# Patient Record
Sex: Female | Born: 1940 | Race: Black or African American | Hispanic: No | Marital: Single | Smoking: Former smoker
Health system: Southern US, Community
[De-identification: ages and names within clinical notes are randomized; demographics above are authoritative.]

## PROBLEM LIST (undated history)

## (undated) DIAGNOSIS — N183 Chronic kidney disease, stage 3 unspecified: Secondary | ICD-10-CM

## (undated) DIAGNOSIS — R0600 Dyspnea, unspecified: Secondary | ICD-10-CM

## (undated) DIAGNOSIS — M1990 Unspecified osteoarthritis, unspecified site: Secondary | ICD-10-CM

## (undated) DIAGNOSIS — E7800 Pure hypercholesterolemia, unspecified: Secondary | ICD-10-CM

## (undated) DIAGNOSIS — E11319 Type 2 diabetes mellitus with unspecified diabetic retinopathy without macular edema: Secondary | ICD-10-CM

## (undated) DIAGNOSIS — E119 Type 2 diabetes mellitus without complications: Secondary | ICD-10-CM

## (undated) DIAGNOSIS — M109 Gout, unspecified: Secondary | ICD-10-CM

## (undated) DIAGNOSIS — I10 Essential (primary) hypertension: Secondary | ICD-10-CM

## (undated) HISTORY — PX: COLONOSCOPY: SHX174

## (undated) HISTORY — DX: Gout, unspecified: M10.9

## (undated) HISTORY — DX: Dyspnea, unspecified: R06.00

## (undated) HISTORY — DX: Type 2 diabetes mellitus with unspecified diabetic retinopathy without macular edema: E11.319

## (undated) HISTORY — DX: Chronic kidney disease, stage 3 (moderate): N18.3

## (undated) HISTORY — DX: Essential (primary) hypertension: I10

## (undated) HISTORY — DX: Chronic kidney disease, stage 3 unspecified: N18.30

## (undated) HISTORY — DX: Type 2 diabetes mellitus without complications: E11.9

## (undated) HISTORY — PX: TONSILLECTOMY: SHX5217

## (undated) HISTORY — DX: Pure hypercholesterolemia, unspecified: E78.00

---

## 1967-06-15 HISTORY — PX: BRAIN SURGERY: SHX531

## 2001-11-19 ENCOUNTER — Inpatient Hospital Stay (HOSPITAL_COMMUNITY): Admission: EM | Admit: 2001-11-19 | Discharge: 2001-11-21 | Payer: Self-pay | Admitting: Emergency Medicine

## 2002-08-24 ENCOUNTER — Encounter: Payer: Self-pay | Admitting: Family Medicine

## 2002-08-24 ENCOUNTER — Encounter: Admission: RE | Admit: 2002-08-24 | Discharge: 2002-08-24 | Payer: Self-pay | Admitting: Family Medicine

## 2002-08-29 ENCOUNTER — Encounter: Admission: RE | Admit: 2002-08-29 | Discharge: 2002-11-27 | Payer: Self-pay | Admitting: Family Medicine

## 2003-06-05 ENCOUNTER — Encounter: Admission: RE | Admit: 2003-06-05 | Discharge: 2003-06-05 | Payer: Self-pay | Admitting: Family Medicine

## 2003-09-02 ENCOUNTER — Ambulatory Visit (HOSPITAL_COMMUNITY): Admission: RE | Admit: 2003-09-02 | Discharge: 2003-09-02 | Payer: Self-pay | Admitting: Gastroenterology

## 2010-02-09 ENCOUNTER — Encounter: Admission: RE | Admit: 2010-02-09 | Discharge: 2010-02-09 | Payer: Self-pay | Admitting: Family Medicine

## 2010-02-12 LAB — HM MAMMOGRAPHY: HM Mammogram: NORMAL

## 2010-06-14 HISTORY — PX: CATARACT EXTRACTION, BILATERAL: SHX1313

## 2010-10-05 ENCOUNTER — Institutional Professional Consult (permissible substitution) (INDEPENDENT_AMBULATORY_CARE_PROVIDER_SITE_OTHER): Payer: PRIVATE HEALTH INSURANCE | Admitting: Family Medicine

## 2010-10-05 DIAGNOSIS — I10 Essential (primary) hypertension: Secondary | ICD-10-CM

## 2010-10-05 DIAGNOSIS — E7800 Pure hypercholesterolemia, unspecified: Secondary | ICD-10-CM

## 2010-10-30 NOTE — H&P (Signed)
[facility]  Patient:    Kerri, Richardson Visit Number: [id number] MRN: [id number]          Service Type: MED Location: [phone] Attending Physician:  Talmadge Coventry Dictated by:   Heather Roberts, M.D. Admit Date:  11/19/2001 Discharge Date: 11/21/2001   CC:         Talmadge Coventry, M.D.   History and Physical  CHIEF COMPLAINT:  Nausea, vomiting, and cough.  HISTORY OF PRESENT ILLNESS:  Kerri Richardson is a 70 year old, single, black female who was exposed to pneumonia about one week prior to admission.  About five days prior to admission, she began to complain of nausea and anorexia and was vomiting on the the day of admission.  She had a nonproductive cough throughout this time.  A friend checked her sugar on the evening of admission and it was elevated, so she was brought to the emergency room where she was found to have a temperature of 103 degrees.  She felt markedly improved after IV fluids, Tylenol, and Phenergan.  Pulse oximetry on room air 94%.  SOCIAL HISTORY:  A 70 year old, black, single female who works at [employer] orders.  She does not drink alcohol or smoke cigarettes.  She lives alone.  Family in town.  CURRENT MEDICATIONS: 1. Glucophage 500 mg four q.d. 2. Avandia 8 mg. 3. Glucotrol 10 mg q.d. 4. Altace 5 mg.  PAST MEDICAL HISTORY:  In 1968, some type of decompression surgery for headache.  FAMILY HISTORY:  Both parents with hypertension.  Mother deceased of ovarian cancer.  A sister has hypertension.  Her dad has diabetes.  REVIEW OF SYSTEMS:  HEENT:  No current allergies.  Cardiac:  No chest pain. Mild shortness of breath.  Pulmonary:  Nonproductive cough this week.  GI: Negative.  LABORATORY DATA:  The chest x-ray showed a left lower lobe density consistent with pneumonia.  White count 10.6, hemoglobin 11.4.  Sodium 131, K 3.6, glucose 362.  CK 129, MB negative, troponin negative.   Urine with bacteria.  IMPRESSION:  Diabetes with consolidated pneumonia.  PLAN:  Admission for antibiotics, IV fluids, and insulin.  The patient is hungrier now. Dictated by:   Heather Roberts, M.D. Attending Physician:  Talmadge Coventry DD:  11/19/01 TD:  11/21/01 Job: [id number]

## 2010-10-30 NOTE — Op Note (Signed)
NAMEPIEPER, KASIK                    ACCOUNT NO.:  [id number]   MEDICAL RECORD NO.:  [id number]                   PATIENT TYPE:  AMB   LOCATION:  ENDO                                 FACILITY:  [facility]   PHYSICIAN:  Anselmo Rod, M.D.               DATE OF BIRTH:  [birth year]   DATE OF PROCEDURE:  09/02/2003  DATE OF DISCHARGE:                                 OPERATIVE REPORT   PROCEDURE PERFORMED:  Screening colonoscopy.   ENDOSCOPIST:  Anselmo Rod, M.D.   INSTRUMENT USED:  Olympus videocolonoscope.   INDICATION FOR THE PROCEDURE:  A 70 year old African-American female  undergoing screening colonoscopy to rule out colonic polyps, masses, etc.   PREPROCEDURE PREPARATION:  Informed consent was procured from the patient.  The patient was fasted for eight hours prior to the procedure and prepped  with a bottle of magnesium citrate and a gallon of GoLYTELY the night prior  to the procedure.   PREPROCEDURE PHYSICAL:  VITAL SIGNS:  Stable.  NECK:  Supple.  CHEST:  Clear to auscultation.  S1 and S2 regular.  ABDOMEN:  Soft with normal bowel sounds.   DESCRIPTION OF THE PROCEDURE:  The patient was placed in the left lateral  decubitus position and sedated with 50 mg of Demerol and 5 mg of Versed  intravenously.  Once the patient was adequately sedated and maintained on  low-flow oxygen and continuous cardiac monitoring, the Olympus  videocolonoscope was advanced from the rectum to the cecum.  The  appendicular orifice and ileocecal valve were clearly visualized and  photographed.  No masses, polyps, erosions, ulcerations, or diverticula were  seen.   IMPRESSION:  Normal colonoscopy to the cecum.   RECOMMENDATIONS:  1. Continue a high fiber diet with liberal fluid intake.  2. Repeat colorectal cancer screening in the next 20 years unless the     patient develops any abnormal symptoms in the interim.  3. Outpatient followup on a p.r.n. basis.                            Anselmo Rod, M.D.    JNM/MEDQ  D:  09/02/2003  T:  09/03/2003  Job:  [id number]   cc:   Talmadge Coventry, M.D.  [address]  [place]  [place] [address]  Fax: [phone]

## 2011-01-05 LAB — HM DIABETES EYE EXAM

## 2011-01-11 ENCOUNTER — Encounter: Payer: Self-pay | Admitting: *Deleted

## 2011-01-11 ENCOUNTER — Ambulatory Visit: Payer: PRIVATE HEALTH INSURANCE | Admitting: Family Medicine

## 2011-02-01 ENCOUNTER — Ambulatory Visit (INDEPENDENT_AMBULATORY_CARE_PROVIDER_SITE_OTHER): Payer: PRIVATE HEALTH INSURANCE | Admitting: Family Medicine

## 2011-02-01 ENCOUNTER — Encounter: Payer: Self-pay | Admitting: Family Medicine

## 2011-02-01 VITALS — BP 134/64 | HR 72 | Ht 62.0 in | Wt 254.0 lb

## 2011-02-01 DIAGNOSIS — E7800 Pure hypercholesterolemia, unspecified: Secondary | ICD-10-CM

## 2011-02-01 DIAGNOSIS — N183 Chronic kidney disease, stage 3 unspecified: Secondary | ICD-10-CM

## 2011-02-01 DIAGNOSIS — E119 Type 2 diabetes mellitus without complications: Secondary | ICD-10-CM

## 2011-02-01 DIAGNOSIS — I10 Essential (primary) hypertension: Secondary | ICD-10-CM

## 2011-02-01 LAB — POCT GLYCOSYLATED HEMOGLOBIN (HGB A1C): Hemoglobin A1C: 6.6

## 2011-02-01 NOTE — Patient Instructions (Addendum)
will need lipids and c-met before next visit in 3 months (written on prescription for you)  Gas pains--decrease beans. Trial of Beano prior to dinner, Gas-X as needed for gas pain.    Contact Dr. Lowell Guitar regarding BP meds--(?restart med he started, stop the Triamterene/HCTZ but consider increasing Lasix dose to help with the edema)

## 2011-02-01 NOTE — Progress Notes (Signed)
Diabetes follow-up:  Lantus was titrated up after last visit in April when A1c was 7.4.  She is currently using 25 Units at bedtime, and continues on the ActoplusMet.  Blood sugars at home are running 120's-130 in the mornings.  Was 147 last night at bedtime.  Denies hypoglycemia.  Denies polydipsia and polyuria.  Last eye exam was last month.  Patient follows a low sugar diet and checks feet regularly without concerns.  Hypertension follow-up:  Blood pressures elsewhere are "good", but she can't remember the numbers.   Denies dizziness, headaches, chest pain.  Denies side effects of medications.  She was changed from Triamterene/HCTZ to another medication by her kidney doctor.  She thinks it began with an "E"--?enalapril?? Her legs swelled up a lot, but he recommended she continue with the new medication.  After calling him twice (and being told to continue the new medication), she stopped taking it and went back to the Triam/HCTZ.  She only took the other med for about 2 weeks.  Swelling is improved.  She hasn't seen him since. She had too much discomfort from the swelling in her legs, to the point where she couldn't sleep  Started [employer] last month, when her weight had been up to 169.  She has lost weight since joining (is now the same weight as she was at last visit 3 months ago)  Complaining of gas RLQ in the evenings.  Passing gas only decreases pain a little.  Gas-X doesn't seem to help.  Diet has changed since starting [employer], and that's when discomfort developed.  Eating cooked vegetables, occasional beans.  Past Medical History  Diagnosis Date  . Type II or unspecified type diabetes mellitus without mention of complication, not stated as uncontrolled   . Pure hypercholesterolemia   . Essential hypertension, benign   . Glaucoma   . Diabetic retinopathy   . CKD (chronic kidney disease) stage 3, GFR 30-59 ml/min     Dr. Lowell Guitar    Past Surgical History  Procedure Date  . Cataract  extraction, bilateral 2012    Dr. Dione Booze  . Colonoscopy 3/05    due again 2015  . Tonsillectomy age 70    History   Social History  . Marital Status: Single    Spouse Name: N/A    Number of Children: N/A  . Years of Education: N/A   Occupational History  . Not on file.   Social History Main Topics  . Smoking status: Former Smoker    Quit date: 06/14/2000  . Smokeless tobacco: Not on file  . Alcohol Use: No  . Drug Use: No  . Sexually Active: Not on file   Other Topics Concern  . Not on file   Social History Narrative  . No narrative on file    Family History  Problem Relation Age of Onset  . Hypertension Mother   . Cancer Mother     lung (nonsmoker)  . Stroke Father   . Hypertension Father   . Diabetes Father   . Hypertension Sister   . Diabetes Sister   . Heart disease Sister     Current outpatient prescriptions:amLODipine (NORVASC) 5 MG tablet, Take 5 mg by mouth daily.  , Disp: , Rfl: ;  aspirin 81 MG tablet, Take 81 mg by mouth daily.  , Disp: , Rfl: ;  Cholecalciferol (VITAMIN D) 1000 UNITS capsule, Take 1,000 Units by mouth daily.  , Disp: , Rfl: ;  furosemide (LASIX) 40 MG tablet,  Take 40 mg by mouth 2 (two) times daily.  , Disp: , Rfl:  insulin glargine (LANTUS) 100 UNIT/ML injection, Inject 25 Units into the skin at bedtime.  , Disp: , Rfl: ;  latanoprost (XALATAN) 0.005 % ophthalmic solution, Place 1 drop into both eyes at bedtime.  , Disp: , Rfl: ;  pioglitazone-metformin (ACTOPLUS MET) 15-850 MG per tablet, Take 1 tablet by mouth 2 (two) times daily with a meal.  , Disp: , Rfl: ;  simvastatin (ZOCOR) 40 MG tablet, Take 40 mg by mouth at bedtime.  , Disp: , Rfl:  triamterene-hydrochlorothiazide (MAXZIDE-25) 37.5-25 MG per tablet, Take 1 tablet by mouth daily.  , Disp: , Rfl: ;  vitamin B-12 (CYANOCOBALAMIN) 100 MCG tablet, Take 100 mcg by mouth daily.  , Disp: , Rfl:   Allergies  Allergen Reactions  . Ace Inhibitors     angioedema   ROS:  Back pain is  improved.  No chest pain, palpitations, shortness of breath, cough, URI complaints.  Leg swelling improved after re-starting triamterene/HCTX in addition to the lasix.  No urinary complaints.  Denies constipation, blood in her stools.  Denies joint pains.  Moods are good.  PHYSICAL EXAM: BP 134/64  Pulse 72  Ht 5\' 2"  (1.575 m)  Wt 254 lb (115.214 kg)  BMI 46.46 kg/m2 Well developed, pleasant obese female in no distress HEENT: PERRL, EOMI, conjunctiva clear Neck: no lymphadenopathy, thyromegaly or bruit Heart: 2/6 SEM at RUSB Lungs: clear bilaterally Extremities: 2+ pulses, no edema, normal sensation  ASSESSMENT/PLAN: 1. Type II or unspecified type diabetes mellitus without mention of complication, not stated as uncontrolled  POCT HgB A1C, Microalbumin / creatinine urine ratio  2. Pure hypercholesterolemia    3. Essential hypertension, benign    4. CKD (chronic kidney disease), stage III     DM--improved. Continue weight loss efforts.  Continue Lantus at 25 units and current dose of ActoplusMet.    HTN--she believes the med given by nephro began with an "e".  Likely was enalapril?? Pt is a diabetic, not on an ACEI.  She really doesn't need to be on 3 diuretics, but perhaps, if worsening leg swelling off of the Triam/HCTZ, her Lasix dose likely needs to be adjusted.  I have recommended that she restart the medication rx'd by Dr. Lowell Guitar (?enalapril?), and to contact him regarding possibly increasing the furosemide dose if leg swelling worsens. She will contact his office to discuss.  Her labs are done at work--will need lipids and c-met before next visit in 3 months  Gas pains--decrease beans. Trial of Beano prior to dinner, Gas-X as needed for gas pain.

## 2011-02-02 LAB — MICROALBUMIN / CREATININE URINE RATIO
Creatinine, Urine: 171.6 mg/dL
Microalb Creat Ratio: 3.6 mg/g (ref 0.0–30.0)
Microalb, Ur: 0.61 mg/dL (ref 0.00–1.89)

## 2011-03-09 ENCOUNTER — Telehealth: Payer: Self-pay | Admitting: *Deleted

## 2011-03-09 DIAGNOSIS — E119 Type 2 diabetes mellitus without complications: Secondary | ICD-10-CM

## 2011-03-09 MED ORDER — INSULIN GLARGINE 100 UNIT/ML ~~LOC~~ SOLN: 25.0000 [IU] | Freq: Every day | SUBCUTANEOUS | Status: DC

## 2011-03-09 NOTE — Telephone Encounter (Signed)
Called patient and left message for her to return my call. I need to find out whether she uses Lantus vial or pen, then I need to fax her rx(on my desk). Waiting for her to return my call.

## 2011-05-03 ENCOUNTER — Encounter: Payer: Self-pay | Admitting: Family Medicine

## 2011-05-03 ENCOUNTER — Ambulatory Visit (INDEPENDENT_AMBULATORY_CARE_PROVIDER_SITE_OTHER): Payer: PRIVATE HEALTH INSURANCE | Admitting: Family Medicine

## 2011-05-03 VITALS — BP 122/62 | HR 64 | Ht 62.0 in | Wt 240.0 lb

## 2011-05-03 DIAGNOSIS — E782 Mixed hyperlipidemia: Secondary | ICD-10-CM

## 2011-05-03 DIAGNOSIS — E119 Type 2 diabetes mellitus without complications: Secondary | ICD-10-CM

## 2011-05-03 DIAGNOSIS — Z23 Encounter for immunization: Secondary | ICD-10-CM

## 2011-05-03 DIAGNOSIS — I10 Essential (primary) hypertension: Secondary | ICD-10-CM

## 2011-05-03 LAB — POCT GLYCOSYLATED HEMOGLOBIN (HGB A1C): Hemoglobin A1C: 6.3

## 2011-05-03 MED ORDER — AMLODIPINE BESYLATE 5 MG PO TABS: 5.0000 mg | ORAL_TABLET | Freq: Every day | ORAL | Status: DC

## 2011-05-03 MED ORDER — ATORVASTATIN CALCIUM 20 MG PO TABS: 20.0000 mg | ORAL_TABLET | Freq: Every day | ORAL | Status: DC

## 2011-05-03 NOTE — Patient Instructions (Signed)
Please try and get at least 30-45 minutes of exercise every day.  Continue your excellent job of losing weight.  We are changing your simvastatin to atorvastatin due to an interaction (potential) with amlodipine.  You need to have bloodwork done (at work--prescription was given to you) in 3 months.    I will see you back here in 6 months, sooner based on labs in 3 months, or if you are having any problems, including low blood sugars

## 2011-05-03 NOTE — Progress Notes (Signed)
Patient presents for 3 month follow-up without any specific concerns or complaints.  Needs flu shot.  Diabetes follow-up:  Blood sugars at home are running 110-120.  Denies hypoglycemia.  Denies polydipsia and polyuria.  Last eye exam was in July.  Patient follows a low sugar diet and checks feet regularly without concerns.  She has lost an additional 14 pounds since her last visit. Tries to walk some on her break at work, just about 10-15 minutes per day.  Hypertension follow-up:  Blood pressures are not checked elsewhere.  Denies dizziness, headaches, chest pain. Denies any edema (had some in the past, but is now improved, occasionally a little tight in the evenings.  Denies side effects of medications.  She brings in copies of labs from work, done 04/15/11.  Glucose 107, BUN/Cr 55/2.10.  LFT's normal.  Chole 150, TG 166, HDL 43, LDL 74. Saw her kidney doctor last week, and has repeat labs ordered for tomorrow.  She had been taking Triamterene/HCTZ, and this was stopped 2 weeks ago (BUN/Cr were elevated, possibly dehydrated).   Past Medical History  Diagnosis Date  . Type II or unspecified type diabetes mellitus without mention of complication, not stated as uncontrolled   . Pure hypercholesterolemia   . Essential hypertension, benign   . Glaucoma   . Diabetic retinopathy   . CKD (chronic kidney disease) stage 3, GFR 30-59 ml/min     Dr. Lowell Guitar   Past Surgical History  Procedure Date  . Cataract extraction, bilateral 2012    Dr. Dione Booze  . Colonoscopy 3/05    due again 2015  . Tonsillectomy age 57   History   Social History  . Marital Status: Single    Spouse Name: N/A    Number of Children: N/A  . Years of Education: N/A   Occupational History  . Not on file.   Social History Main Topics  . Smoking status: Former Smoker    Quit date: 06/14/2000  . Smokeless tobacco: Not on file  . Alcohol Use: No  . Drug Use: No  . Sexually Active: Not on file   Other Topics Concern  .  Not on file   Social History Narrative  . No narrative on file    Family History  Problem Relation Age of Onset  . Hypertension Mother   . Cancer Mother     lung (nonsmoker)  . Stroke Father   . Hypertension Father   . Diabetes Father   . Hypertension Sister   . Diabetes Sister   . Heart disease Sister    Current Outpatient Prescriptions on File Prior to Visit  Medication Sig Dispense Refill  . aspirin 81 MG tablet Take 81 mg by mouth daily.        . Cholecalciferol (VITAMIN D) 1000 UNITS capsule Take 1,000 Units by mouth daily.        . furosemide (LASIX) 40 MG tablet Take 40 mg by mouth 2 (two) times daily.        . insulin glargine (LANTUS) 100 UNIT/ML injection Inject 25 Units into the skin at bedtime.  30 mL  1  . latanoprost (XALATAN) 0.005 % ophthalmic solution Place 1 drop into both eyes at bedtime.        . pioglitazone-metformin (ACTOPLUS MET) 15-850 MG per tablet Take 1 tablet by mouth 2 (two) times daily with a meal.        . vitamin B-12 (CYANOCOBALAMIN) 100 MCG tablet Take 100 mcg by mouth daily.        Marland Kitchen  DISCONTD: amLODipine (NORVASC) 5 MG tablet Take 5 mg by mouth daily.        Marland Kitchen atorvastatin (LIPITOR) 20 MG tablet Take 1 tablet (20 mg total) by mouth daily.  30 tablet  3  (not on Lipitor prior to visit--on Simvastatin 40mg )  Allergies  Allergen Reactions  . Ace Inhibitors     angioedema   ROS:  Denies fevers, URI symptoms, cough, SOB, chest pain, palpitations, nausea, vomiting, previous complaint of abdominal pain is much improved. Denies skin concerns or other problems  PHYSICAL EXAM: BP 122/62  Pulse 64  Ht 5\' 2"  (1.575 m)  Wt 240 lb (108.863 kg)  BMI 43.90 kg/m2 Well developed, pleasant, obese female in no distress Neck: no lymphadenopathy, thyromegaly or bruit  Heart: 2/6 SEM at RUSB  Lungs: clear bilaterally  Abdomen: soft, nontender, no organomegaly or mass Extremities: 2+ pulses, no edema, normal sensation Skin: without rashes or  lesions  ASSESSMENT/PLAN:  1. Type II or unspecified type diabetes mellitus without mention of complication, not stated as uncontrolled  POCT HgB A1C   well controlled  2. Need for prophylactic vaccination and inoculation against influenza  Flu vaccine greater than or equal to 3yo preservative free IM  3. Essential hypertension, benign  amLODipine (NORVASC) 5 MG tablet   well controlled  4. Mixed hyperlipidemia  atorvastatin (LIPITOR) 20 MG tablet   borderline TG, overall okay.  Only changing meds due to interaction with amlodipine   Change from simvastatin to Lipitor due to interaction with amlodipine. Re-check LFT's and lipids in 3 months--Rx given to patient for labs to be drawn at work.   Will need to call for orders for labs to be done prior to 6 month visit, but will depend on labs done in 3 months.  F/u as scheduled with nephro for re-check b-met to f/u on BUN/Cr after d/c'ing diuretics

## 2011-05-05 DIAGNOSIS — Z23 Encounter for immunization: Secondary | ICD-10-CM

## 2011-05-28 ENCOUNTER — Encounter (HOSPITAL_COMMUNITY): Payer: Self-pay | Admitting: *Deleted

## 2011-05-28 ENCOUNTER — Emergency Department (HOSPITAL_COMMUNITY)
Admission: EM | Admit: 2011-05-28 | Discharge: 2011-05-28 | Disposition: A | Discharge status: Home / Self Care | Payer: PRIVATE HEALTH INSURANCE | Source: Home / Self Care | Attending: Family Medicine | Admitting: Family Medicine

## 2011-05-28 DIAGNOSIS — M79673 Pain in unspecified foot: Secondary | ICD-10-CM

## 2011-05-28 DIAGNOSIS — M79609 Pain in unspecified limb: Secondary | ICD-10-CM

## 2011-05-28 MED ORDER — SPENCO GEL HEEL CUP MED/LG MISC: 1.0000 "application " | Freq: Every day | Status: DC

## 2011-05-28 MED ORDER — HYDROCODONE-ACETAMINOPHEN 5-500 MG PO TABS: 1.0000 | ORAL_TABLET | Freq: Four times a day (QID) | ORAL | Status: AC | PRN

## 2011-05-28 MED ORDER — DICLOFENAC SODIUM 1 % TD GEL: 1.0000 "application " | Freq: Four times a day (QID) | TRANSDERMAL | Status: DC

## 2011-05-28 NOTE — ED Notes (Signed)
C/O pain left heel x 2 days without injury.  Has been taking Tylenol without relief.

## 2011-05-28 NOTE — ED Provider Notes (Signed)
Kerri Richardson is a 70 year old woman with left heel pain for the last 4 days.  She had no injury to start the heel pain. She has never had anything like this happen before.  She notes the pain in the posterior heel and not along the arch of her foot.  She cannot wear closed heel shoes as it hurts too much.  She is able to walk normally and stand on her toes.  She has tried Tylenol for pain which helps only a little.  PMH reviewed.  ROS as above otherwise neg Medications reviewed. No current facility-administered medications for this encounter.   Current Outpatient Prescriptions  Medication Sig Dispense Refill  . amLODipine (NORVASC) 5 MG tablet Take 1 tablet (5 mg total) by mouth daily.  90 tablet  1  . aspirin 81 MG tablet Take 81 mg by mouth daily.        Marland Kitchen atorvastatin (LIPITOR) 20 MG tablet Take 1 tablet (20 mg total) by mouth daily.  30 tablet  3  . Cholecalciferol (VITAMIN D) 1000 UNITS capsule Take 1,000 Units by mouth daily.        . enalapril (VASOTEC) 5 MG tablet Take 5 mg by mouth daily.        . furosemide (LASIX) 40 MG tablet Take 40 mg by mouth 2 (two) times daily.        . insulin glargine (LANTUS) 100 UNIT/ML injection Inject 25 Units into the skin at bedtime.  30 mL  1  . latanoprost (XALATAN) 0.005 % ophthalmic solution Place 1 drop into both eyes at bedtime.        . pioglitazone-metformin (ACTOPLUS MET) 15-850 MG per tablet Take 1 tablet by mouth 2 (two) times daily with a meal.        . vitamin B-12 (CYANOCOBALAMIN) 100 MCG tablet Take 100 mcg by mouth daily.        . diclofenac sodium (VOLTAREN) 1 % GEL Apply 1 application topically 4 (four) times daily.  100 g  1  . Foot Care Products [facility] GEL HEEL CUP MED/LG) MISC 1 application by Does not apply route daily.  2 each  0  . HYDROcodone-acetaminophen (VICODIN) 5-500 MG per tablet Take 1 tablet by mouth every 6 (six) hours as needed for pain.  20 tablet  0    Exam:  BP 130/48  Pulse 80  Temp(Src) 97.7 F (36.5 C)  (Oral)  Resp 14  SpO2 98% Gen: Well NAD MSK: Normal-appearing left heel with pes planus feet bilaterally.  Tender to palpation along the Achilles insertion on the left posterior heel. Kerri Richardson is able to stand on her toes and walk.  She has no pain along the bottom of her calcaneous or along her arch. Nontender to ankle range of motion.  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 Plan to treat with heel cups rest Tylenol topical NSAIDs (Voltaren gel versus compounded ketoprofen gel depending on cost), and hydrocodone. Will followup with orthopedics on Monday. Feel risk for Achilles tendon rupture low as patient can easily stand on her toes.   Kerri Richardson 05/28/11 1943

## 2011-06-10 ENCOUNTER — Other Ambulatory Visit: Payer: Self-pay | Admitting: Family Medicine

## 2011-06-22 ENCOUNTER — Ambulatory Visit: Payer: PRIVATE HEALTH INSURANCE

## 2011-07-08 LAB — HM DIABETES EYE EXAM

## 2011-09-08 ENCOUNTER — Other Ambulatory Visit: Payer: Self-pay | Admitting: Family Medicine

## 2011-11-01 ENCOUNTER — Encounter: Payer: Self-pay | Admitting: Family Medicine

## 2011-11-01 ENCOUNTER — Ambulatory Visit (INDEPENDENT_AMBULATORY_CARE_PROVIDER_SITE_OTHER): Payer: PRIVATE HEALTH INSURANCE | Admitting: Family Medicine

## 2011-11-01 VITALS — BP 136/62 | HR 62 | Ht 62.25 in | Wt 244.0 lb

## 2011-11-01 DIAGNOSIS — I10 Essential (primary) hypertension: Secondary | ICD-10-CM

## 2011-11-01 DIAGNOSIS — E119 Type 2 diabetes mellitus without complications: Secondary | ICD-10-CM

## 2011-11-01 DIAGNOSIS — E782 Mixed hyperlipidemia: Secondary | ICD-10-CM

## 2011-11-01 MED ORDER — PIOGLITAZONE HCL-METFORMIN HCL 15-850 MG PO TABS: 1.0000 | ORAL_TABLET | Freq: Two times a day (BID) | ORAL | Status: DC

## 2011-11-01 MED ORDER — ATORVASTATIN CALCIUM 20 MG PO TABS: 20.0000 mg | ORAL_TABLET | Freq: Every day | ORAL | Status: DC

## 2011-11-01 MED ORDER — AMLODIPINE BESYLATE 5 MG PO TABS: 5.0000 mg | ORAL_TABLET | Freq: Every day | ORAL | Status: DC

## 2011-11-01 NOTE — Patient Instructions (Signed)
Try and get at least 30-45 minutes of exercise daily. Cut back on cholesterol in diet as we discussed (no mayonnaise, creamy foods, cut back on red meat, etc). Follow a low sodium diet (low salt)--avoid processed foods and salted chips, crackers, nuts, etc. Please write down the blood pressure values when they are checked at work, and bring the list to your next appointment.  If you can get copies of Jan/Feb lab results, please also bring.

## 2011-11-01 NOTE — Progress Notes (Signed)
Chief Complaint  Patient presents with  . Diabetes    med check and follow-up on DM   HPI:  She presents for 6 month follow with no specific concerns.  Brings in a copy of labs done through work.  Normal CBC; chem shows glu 109, Cr 1.23 (prev 1.31) otherwise normal chem.  Chol 183, TG 153, HDL 49, LDL 103.  A1c 5.9. Vitamin D 42.6, TSH 3.090  Diabetes follow-up:  Blood sugars at home are running 110-120.  Denies hypoglycemia.  Denies polydipsia and polyuria.  Last eye exam was January 2013, goes every 6 months.  Patient follows a low sugar diet and checks feet regularly without concerns. They put cameras in the building so she is no longer able to walk during work, so hasn't been getting much exercise.  Hypertension follow-up:  Blood pressures elsewhere are "good" at work--doesn't know the numbers.  Denies dizziness, headaches, chest pain, edema.  Denies side effects of medications. Sees nephrologist Dr. Lowell Guitar every 6 months.  Gets enalapril through their office.  Hyperlipidemia follow-up:  Patient is reportedly following a low-fat, low cholesterol diet.  Compliant with medications and denies medication side effects.  Meds were changed at last visit 6 months ago from simvastatin to atorvastatin due to interaction with amlodipine.  She had labs done through work 3 months later, but no results were ever received here.    Past Medical History  Diagnosis Date  . Type II or unspecified type diabetes mellitus without mention of complication, not stated as uncontrolled   . Pure hypercholesterolemia   . Essential hypertension, benign   . Glaucoma   . Diabetic retinopathy   . CKD (chronic kidney disease) stage 3, GFR 30-59 ml/min     Dr. Lowell Guitar    Past Surgical History  Procedure Date  . Cataract extraction, bilateral 2012    Dr. Dione Booze  . Colonoscopy 3/05    due again 2015  . Tonsillectomy age 28    History   Social History  . Marital Status: Single    Spouse Name: N/A    Number of  Children: N/A  . Years of Education: N/A   Occupational History  . Not on file.   Social History Main Topics  . Smoking status: Former Smoker    Quit date: 06/14/2000  . Smokeless tobacco: Not on file  . Alcohol Use: No  . Drug Use: No  . Sexually Active: Not on file   Other Topics Concern  . Not on file   Social History Narrative  . No narrative on file    Family History  Problem Relation Age of Onset  . Hypertension Mother   . Cancer Mother     lung (nonsmoker)  . Stroke Father   . Hypertension Father   . Diabetes Father   . Hypertension Sister   . Diabetes Sister   . Heart disease Sister    Current Outpatient Prescriptions on File Prior to Visit  Medication Sig Dispense Refill  . aspirin 81 MG tablet Take 81 mg by mouth daily.        . Cholecalciferol (VITAMIN D) 1000 UNITS capsule Take 1,000 Units by mouth daily.        . diclofenac sodium (VOLTAREN) 1 % GEL Apply 1 application topically 4 (four) times daily.  100 g  1  . enalapril (VASOTEC) 5 MG tablet Take 5 mg by mouth daily.        . Foot Care Products [facility] GEL HEEL CUP MED/LG)  MISC 1 application by Does not apply route daily.  2 each  0  . furosemide (LASIX) 40 MG tablet Take 40 mg by mouth 2 (two) times daily.        . insulin glargine (LANTUS) 100 UNIT/ML injection Inject 25 Units into the skin at bedtime. Patient uses vial of Lantus.      Marland Kitchen latanoprost (XALATAN) 0.005 % ophthalmic solution Place 1 drop into both eyes at bedtime.        . vitamin B-12 (CYANOCOBALAMIN) 100 MCG tablet Take 100 mcg by mouth daily.        Marland Kitchen DISCONTD: ACTOPLUS MET 15-850 MG per tablet TAKE ONE TABLET BY MOUTH TWICE DAILY  180 each  1  . DISCONTD: amLODipine (NORVASC) 5 MG tablet Take 1 tablet (5 mg total) by mouth daily.  90 tablet  1  . DISCONTD: atorvastatin (LIPITOR) 20 MG tablet TAKE ONE TABLET BY MOUTH EVERY DAY  30 tablet  1   Allergies  Allergen Reactions  . Ace Inhibitors     Angioedema (pt states PCP states lip  swelling was from med, "but it was from lipstick")   ROS: Denies fevers, URI symptoms, allergies. No nausea, vomiting, diarrhea, skin rashes, lesions.  Denies chest pain, palpitations, shortness of breath, edema.  Denies depression or other concerns  PHYSICAL EXAM: BP 136/80  Pulse 62  Ht 5' 2.25" (1.581 m)  Wt 244 lb (110.678 kg)  BMI 44.27 kg/m2 136/62 repeat by MD RA Well developed, pleasant, obese female in no distress  Neck: no lymphadenopathy, thyromegaly or bruit  Heart: 2/6 SEM at RUSB  Lungs: clear bilaterally  Abdomen: soft, nontender, no organomegaly or mass  Extremities: 2+ pulses, trace to 1+ pretibial edema, normal sensation  Skin: without rashes or lesions Normal diabetic foot exam  ASSESSMENT/PLAN: 1. Essential hypertension, benign  amLODipine (NORVASC) 5 MG tablet  2. Mixed hyperlipidemia  atorvastatin (LIPITOR) 20 MG tablet  3. Type II or unspecified type diabetes mellitus without mention of complication, not stated as uncontrolled  pioglitazone-metformin (ACTOPLUS MET) 15-850 MG per tablet  4. Essential hypertension, benign  amLODipine (NORVASC) 5 MG tablet   well controlled   BP--mildly elevated today.  Checked periodically at work but numbers aren't known.  Continue to check at work, write down and bring to follow-up.  Get copies of labs from February.   Lipids are borderline --reviewed low cholesterol diet.  Will continue current dose, but if LDL remains >100 at next check, will need to increase dose  Obesity--encouraged daily exercise, at least 30 minutes, and further weight loss encouraged.  DM--well controlled.  Urine microalbumin due in August F/u 3 months with list of blood pressures. Hoping to see weight loss, improved BP and LDL<100.  If not, adjust meds to reach these goals

## 2012-01-31 ENCOUNTER — Encounter: Payer: Self-pay | Admitting: Family Medicine

## 2012-01-31 ENCOUNTER — Ambulatory Visit (INDEPENDENT_AMBULATORY_CARE_PROVIDER_SITE_OTHER): Payer: PRIVATE HEALTH INSURANCE | Admitting: Family Medicine

## 2012-01-31 VITALS — BP 124/70 | HR 76 | Ht 62.0 in | Wt 252.0 lb

## 2012-01-31 DIAGNOSIS — I10 Essential (primary) hypertension: Secondary | ICD-10-CM

## 2012-01-31 DIAGNOSIS — E782 Mixed hyperlipidemia: Secondary | ICD-10-CM

## 2012-01-31 DIAGNOSIS — E119 Type 2 diabetes mellitus without complications: Secondary | ICD-10-CM

## 2012-01-31 NOTE — Patient Instructions (Signed)
Try and exercise at least 30 minutes every day (can be done in 10-15 minute intervals, if needed).  Try and eat a healthy diet, and cut back on portion sizes some, as you have gained 8 pounds in the last 3 months.    Continue lowfat, low cholesterol diet--your lipid numbers were much better.  Continue all current medications.  Check your blood pressure at home (and/or at work)--please write down the blood pressure and pulse.  Bring your wrist monitor with you at your next visit, along with the piece of paper with the list of blood pressures from home and work.

## 2012-01-31 NOTE — Progress Notes (Signed)
Chief Complaint  Patient presents with  . Diabetes    med check.   HPI: Patient presents for 3 month f/u on blood pressure and lipids.  At last visit in May, DM was well controlled, but LDL was >100 and BP's borderline high.  She states she wasn't able to get the nurse at work to check her BP since last visit.  She has a wrist monitor at home, and occasionally has checked it but she doesn't recall the numbers.  She has cut back on cheeses and potato chips, and cut out creamy things.  She brings in labs done at work--c-met normal except Cr 1.25 and K 5.3 (GFR 51).  Lipids 152, HDL 56, LDL 80, TG 78 (improved from LDL 103, TG 153, HDL 49).  A1c up from 5.9 to 6.1 on this recent check ( 01/25/12).  She has gained 8 pounds since her visit 3 months ago. Doesn't get much exercise, other than walking from her car to work, and she tries to park farther away.  No longer allowed to walk outside at work.  Past Medical History  Diagnosis Date  . Type II or unspecified type diabetes mellitus without mention of complication, not stated as uncontrolled   . Pure hypercholesterolemia   . Essential hypertension, benign   . Glaucoma   . Diabetic retinopathy   . CKD (chronic kidney disease) stage 3, GFR 30-59 ml/min     Dr. Lowell Guitar   History   Social History  . Marital Status: Single    Spouse Name: N/A    Number of Children: N/A  . Years of Education: N/A   Occupational History  . Not on file.   Social History Main Topics  . Smoking status: Former Smoker    Quit date: 06/14/2000  . Smokeless tobacco: Not on file  . Alcohol Use: No  . Drug Use: No  . Sexually Active: Not on file   Other Topics Concern  . Not on file   Social History Narrative  . No narrative on file     Current Outpatient Prescriptions on File Prior to Visit  Medication Sig Dispense Refill  . amLODipine (NORVASC) 5 MG tablet Take 1 tablet (5 mg total) by mouth daily.  90 tablet  1  . aspirin 81 MG tablet Take 81 mg by  mouth daily.        Marland Kitchen atorvastatin (LIPITOR) 20 MG tablet Take 1 tablet (20 mg total) by mouth daily.  90 tablet  1  . Cholecalciferol (VITAMIN D) 1000 UNITS capsule Take 1,000 Units by mouth daily.        . enalapril (VASOTEC) 5 MG tablet Take 5 mg by mouth daily.        . furosemide (LASIX) 40 MG tablet Take 40 mg by mouth 2 (two) times daily.        . insulin glargine (LANTUS) 100 UNIT/ML injection Inject 25 Units into the skin at bedtime. Patient uses vial of Lantus.      Marland Kitchen latanoprost (XALATAN) 0.005 % ophthalmic solution Place 1 drop into both eyes at bedtime.        . pioglitazone-metformin (ACTOPLUS MET) 15-850 MG per tablet Take 1 tablet by mouth 2 (two) times daily with a meal.  180 tablet  1  . vitamin B-12 (CYANOCOBALAMIN) 100 MCG tablet Take 100 mcg by mouth daily.         Allergies  Allergen Reactions  . Ace Inhibitors     Angioedema (pt states  PCP states lip swelling was from med, "but it was from lipstick")   ROS:  Denies fevers, headaches, dizziness, chest pain, URI symptoms ,cough, shortness of breath, nausea, vomiting, bowel changes skin lesions, numbness or other concerns.  PHYSICAL EXAM: BP 124/70  Pulse 76  Ht 5\' 2"  (1.575 m)  Wt 252 lb (114.306 kg)  BMI 46.09 kg/m2 142/70 on repeat by MD RA Well developed, pleasant obese female in no distress Neck: no lymphadenopathy or mass Heart: regular rate and rhythm Lungs: clear bilaterally Extremities: No edema Skin: no rash  ASSESSMENT/PLAN: 1. Mixed hyperlipidemia     improved control, now at goal.  2. Essential hypertension, benign     ? control  3. Type II or unspecified type diabetes mellitus without mention of complication, not stated as uncontrolled  Microalbumin / creatinine urine ratio   controlled   Counseled extensively re: diet (chol improved--continue lowfat, low cholesterol diet; healthy lunch options, portion control, need for weight loss) and how to get at least 30 minutes of exercise daily.   Discussed importance of losing weight re: her health issues.  HTN--? Control.  Check BP's at work, and at home--bring home monitor and list of BP's to next visit.  DM--controlled on current regimen.  Return in 3 months--doesn't need labs prior, just do A1c at visit

## 2012-02-01 LAB — MICROALBUMIN / CREATININE URINE RATIO
Creatinine, Urine: 127.4 mg/dL
Microalb Creat Ratio: 6.4 mg/g (ref 0.0–30.0)
Microalb, Ur: 0.81 mg/dL (ref 0.00–1.89)

## 2012-02-02 ENCOUNTER — Encounter: Payer: Self-pay | Admitting: Family Medicine

## 2012-02-25 ENCOUNTER — Telehealth: Payer: Self-pay | Admitting: Internal Medicine

## 2012-02-25 NOTE — Telephone Encounter (Signed)
Pt uses [facility]

## 2012-02-28 ENCOUNTER — Telehealth: Payer: Self-pay | Admitting: Internal Medicine

## 2012-02-28 MED ORDER — INSULIN GLARGINE 100 UNIT/ML ~~LOC~~ SOLN: 25.0000 [IU] | Freq: Every day | SUBCUTANEOUS | Status: DC

## 2012-02-28 NOTE — Telephone Encounter (Signed)
Faxed in to [facility] for lantus vial with 3 refills

## 2012-05-01 ENCOUNTER — Encounter: Payer: Self-pay | Admitting: Family Medicine

## 2012-05-01 ENCOUNTER — Ambulatory Visit (INDEPENDENT_AMBULATORY_CARE_PROVIDER_SITE_OTHER): Payer: PRIVATE HEALTH INSURANCE | Admitting: Family Medicine

## 2012-05-01 VITALS — BP 132/62 | HR 80 | Ht 62.0 in | Wt 262.0 lb

## 2012-05-01 DIAGNOSIS — I10 Essential (primary) hypertension: Secondary | ICD-10-CM

## 2012-05-01 DIAGNOSIS — E119 Type 2 diabetes mellitus without complications: Secondary | ICD-10-CM

## 2012-05-01 LAB — POCT GLYCOSYLATED HEMOGLOBIN (HGB A1C): Hemoglobin A1C: 6.3

## 2012-05-01 NOTE — Progress Notes (Signed)
Chief Complaint  Patient presents with  . Diabetes    nonfasting med check-needs A1c done.   HPI:  She presents for follow-up on diabetes and blood pressure.  BP's at home are running 112-154/68-75.  Mostly 140/70.  She brought in her BP monitor to have accuracy verified.  She denies headaches, chest pain.  Diabetes--she reports being compliant with her medications.  She is noted to have 10 pound weight gain since last visit 3 months ago.  And had gained 8 pounds prior to the last visit.  Hasn't been able to exercise--no longer able to walk at work, and is dark before and after work  Prior to her sister's passing, they used to go to [employer].  She hasn't been in a while.  She has done Weight [employer] in the past--doesn't really seem to love it, and always regains the weight when she stops the program.  Past Medical History  Diagnosis Date  . Type II or unspecified type diabetes mellitus without mention of complication, not stated as uncontrolled   . Pure hypercholesterolemia   . Essential hypertension, benign   . Glaucoma(365)   . Diabetic retinopathy(362.0)   . CKD (chronic kidney disease) stage 3, GFR 30-59 ml/min     Dr. Lowell Guitar   Past Surgical History  Procedure Date  . Cataract extraction, bilateral 2012    Dr. Dione Booze  . Colonoscopy 3/05    due again 2015  . Tonsillectomy age 61   History   Social History  . Marital Status: Single    Spouse Name: N/A    Number of Children: N/A  . Years of Education: N/A   Occupational History  . Not on file.   Social History Main Topics  . Smoking status: Former Smoker    Quit date: 06/14/2000  . Smokeless tobacco: Not on file  . Alcohol Use: No  . Drug Use: No  . Sexually Active: Not on file   Other Topics Concern  . Not on file   Social History Narrative   Works at [employer]   Current outpatient prescriptions:amLODipine (NORVASC) 5 MG tablet, Take 1 tablet (5 mg total) by mouth daily., Disp: 90 tablet, Rfl: 1;   aspirin 81 MG tablet, Take 81 mg by mouth daily.  , Disp: , Rfl: ;  atorvastatin (LIPITOR) 20 MG tablet, Take 1 tablet (20 mg total) by mouth daily., Disp: 90 tablet, Rfl: 1;  Cholecalciferol (VITAMIN D) 1000 UNITS capsule, Take 1,000 Units by mouth daily.  , Disp: , Rfl:  enalapril (VASOTEC) 5 MG tablet, Take 5 mg by mouth daily.  , Disp: , Rfl: ;  furosemide (LASIX) 40 MG tablet, Take 40 mg by mouth 2 (two) times daily.  , Disp: , Rfl: ;  insulin glargine (LANTUS) 100 UNIT/ML injection, Inject 25 Units into the skin at bedtime. Patient uses vial of Lantus., Disp: 10 mL, Rfl: 3;  latanoprost (XALATAN) 0.005 % ophthalmic solution, Place 1 drop into both eyes at bedtime.  , Disp: , Rfl:  pioglitazone-metformin (ACTOPLUS MET) 15-850 MG per tablet, Take 1 tablet by mouth 2 (two) times daily with a meal., Disp: 180 tablet, Rfl: 1;  vitamin B-12 (CYANOCOBALAMIN) 100 MCG tablet, Take 100 mcg by mouth daily.  , Disp: , Rfl:   Allergies  Allergen Reactions  . Ace Inhibitors     Angioedema (pt states PCP states lip swelling was from med, "but it was from lipstick")   ROS:  Denies fevers, URI, congestion, cough, shortness of  breath, chest pain, palpitations, headaches, dizziness, numbness/tingling, skin lesions/rashes or other concerns.  Occasional knee pain  PHYSICAL EXAM: BP 132/62  Pulse 80  Ht 5\' 2"  (1.575 m)  Wt 262 lb (118.842 kg)  BMI 47.92 kg/m2 Well developed, obese female in no distress.  Initially was agitated, related to wait for visit, but seemed to later relax, smile. Pt's BP monitor LA 156/69 150/68 by MD on LA Neck: no lymphadenopathy, thyromegaly or mass Heart: regular rate and rhythm Lungs: clear bilaterally Extremities: No edema, nontender Skin: no rash Psych: full range of affect Neuro: alert and oriented.  Normal gait  A1c 6.3  ASSESSMENT/PLAN:  1. Type II or unspecified type diabetes mellitus without mention of complication, not stated as uncontrolled  HgB A1c  2.  Essential hypertension, benign     DM--A1c is gradually creeping up, along with her weight.  Discussed the need for daily exercise, healthy diet, portion control  Refer to nutritionist--weight loss, DM, HTN, lipids-- (ongoing weight gain as main issue that needs to be addressed).  Discussed ways to exercise in her home, at least 30 minutes/day. Consider restarting water aerobics (will be easier on her knees)  HTN--BP above goal for diabetic.  Home blood pressure monitor is accurate.  She sees Dr. Lowell Guitar for her kidneys, who rx's her ACEI and monitors BP.  I will call him to discuss potentially increasing ACEI vs amlodipine.  Daily exercise, weight loss, low sodium diet reviewed.  11/19 11:30am--Dr. Lowell Guitar is on vacation.  Left detailed voicemail with my question, awaiting his reply.   3 month f/u [facility] depending on BP's and changes made  30 minute visit, more than 1/2 spent counseling

## 2012-05-01 NOTE — Patient Instructions (Signed)
Please try and exercise at least 30 minutes per day--even if in 10-15 minute intervals 2-3x/day.  This can be just marching in place while watching TV, dancing, Zumba, whatever gets your heart rate up, and you enjoy.  Try and get back to doing water aerobics.  I am going to refer you to a nutritionist to help develop a diet plan/help with weight loss.  I am going to check with Dr. Lowell Guitar regarding your blood pressure medications--your blood pressure is to high (goal is <130/80).  I will discuss changes in your medications with him, and get back to you.

## 2012-05-04 ENCOUNTER — Telehealth: Payer: Self-pay | Admitting: Family Medicine

## 2012-05-04 NOTE — Telephone Encounter (Signed)
I spoke with Dr. Casimiro Needle (nephro) regarding pt's elevated BP's.  He agrees with recommendation to change meds, increasing enalapril and monitoring Creatinine.    Please call pt and advise her to increase enalapril to 10mg  once daily. Send rx for  #30 x 1 refill to pharmacy (she can double up on the 5mg  tablets she currently has). Schedule lab visit for 3-4 weeks for b-met (v58.69) and have her drop off a list of her blood pressures when she comes to have labs drawn  (or alternatively she can have them do b-met at her job, if able, and fax results.  Will also need to fax or call with her BP's).  Need to monitor kidney function on higher ACEI dose (okay to continue if increases only 0.2-0.3 in Creatinine)--I will send copies of results as FYI to Dr. Lowell Guitar when received.

## 2012-05-05 ENCOUNTER — Other Ambulatory Visit: Payer: Self-pay

## 2012-05-05 DIAGNOSIS — Z79899 Other long term (current) drug therapy: Secondary | ICD-10-CM

## 2012-05-05 MED ORDER — ENALAPRIL MALEATE 10 MG PO TABS: 10.0000 mg | ORAL_TABLET | Freq: Every day | ORAL | Status: DC

## 2012-05-05 NOTE — Telephone Encounter (Signed)
Called pt left message to call us back to get results per Dr.Knapp also I have already sent in med and put orders in for lab

## 2012-05-05 NOTE — Telephone Encounter (Signed)
Sent in enalapril 10 mg with 1 refill and put in future order for BMET

## 2012-05-08 ENCOUNTER — Other Ambulatory Visit: Payer: Self-pay | Admitting: Family Medicine

## 2012-05-08 ENCOUNTER — Telehealth: Payer: Self-pay

## 2012-05-08 ENCOUNTER — Other Ambulatory Visit: Payer: Self-pay | Admitting: *Deleted

## 2012-05-08 NOTE — Telephone Encounter (Signed)
Pt called Friday left message that I was trying to get in contact with her I think she is your pt.

## 2012-05-08 NOTE — Telephone Encounter (Signed)
Spoke with patient and she will increase her enalapril to 10mg  daily, she will double up on her 5mg  tabs until out and then she will pick up new rx for the 10mg , which Cheri called in to [facility] [address]. She will have nurse at her work drawn at her office and they will fax, she will also have them fax the bp readings.

## 2012-05-29 ENCOUNTER — Other Ambulatory Visit: Payer: Self-pay | Admitting: Family Medicine

## 2012-06-05 ENCOUNTER — Encounter: Payer: Self-pay | Admitting: Family Medicine

## 2012-06-05 DIAGNOSIS — E11319 Type 2 diabetes mellitus with unspecified diabetic retinopathy without macular edema: Secondary | ICD-10-CM

## 2012-06-20 ENCOUNTER — Other Ambulatory Visit: Payer: Self-pay | Admitting: Family Medicine

## 2012-06-23 ENCOUNTER — Other Ambulatory Visit: Payer: Self-pay | Admitting: Family Medicine

## 2012-07-27 ENCOUNTER — Other Ambulatory Visit: Payer: Self-pay | Admitting: Family Medicine

## 2012-08-09 ENCOUNTER — Encounter: Payer: Self-pay | Admitting: Family Medicine

## 2012-08-09 ENCOUNTER — Ambulatory Visit (INDEPENDENT_AMBULATORY_CARE_PROVIDER_SITE_OTHER): Payer: PRIVATE HEALTH INSURANCE | Admitting: Family Medicine

## 2012-08-09 VITALS — BP 130/64 | HR 72 | Ht 62.0 in | Wt 261.0 lb

## 2012-08-09 DIAGNOSIS — E119 Type 2 diabetes mellitus without complications: Secondary | ICD-10-CM

## 2012-08-09 MED ORDER — ATORVASTATIN CALCIUM 20 MG PO TABS: ORAL_TABLET | ORAL | Status: DC

## 2012-08-09 MED ORDER — AMLODIPINE BESYLATE 10 MG PO TABS: 10.0000 mg | ORAL_TABLET | Freq: Every day | ORAL | Status: AC

## 2012-08-09 MED ORDER — PIOGLITAZONE HCL-METFORMIN HCL 15-850 MG PO TABS: ORAL_TABLET | ORAL | Status: DC

## 2012-08-09 MED ORDER — ENALAPRIL MALEATE 10 MG PO TABS: ORAL_TABLET | ORAL | Status: DC

## 2012-08-09 NOTE — Progress Notes (Signed)
Chief Complaint  Patient presents with  . Diabetes    med check, non fasting.    Patient presents for follow up on hypertension.  At her last visit BP's were above goal.  I had spoken with Dr. Lowell Guitar, who recommended increasing enalapril from 5 to 10mg  and monitoring Cr.  He subsequently also increased her amlodipine from 5 to 10mg  at a visit with her.  She denies any side effects to these med dose changes. Denies any increase in edema.  BP's at home are improved, mostly 130's-140, no longer getting any 150's.  Denies headaches, dizziness.    She also presents for diabetes follow-up.  She forgot to bring in her paper with BP's and sugars, but recalls that sugars are running 120-130's in the mornings, not checking later in the day.  Denies hypoglycemia.  Sees ophtho every 3 months, due again in March.  Denies numbness or tingling or burning pain in feet.  Denies any skin concerns, sores or lesions.  She was referred to nutritionist at last visit, but was on vacation when she received the call, and never got phone number to call back to reschedule.  She is walking some in her house, about 15 minutes each evening. Hasn't started back at water aerobics yet (doesn't like to do in cold weather). Knee pain, right.  She gets "tired" with exercise, since she gained weight, described as a slight shortness of breath.  Denies chest pain or pressure.  Patient brought in copies of labs done through work.  c-met showed glucose 100, BUN/Cr 39/1.29, K 5.1, normal LFT's.  Cholesterol 164, TG 117, HDL 44, LDL 97, A1c 6.5.  Past Medical History  Diagnosis Date  . Type II or unspecified type diabetes mellitus without mention of complication, not stated as uncontrolled   . Pure hypercholesterolemia   . Essential hypertension, benign   . Glaucoma(365)   . Diabetic retinopathy(362.0)   . CKD (chronic kidney disease) stage 3, GFR 30-59 ml/min     Dr. Lowell Guitar   Past Surgical History  Procedure Laterality Date  .  Cataract extraction, bilateral  2012    Dr. Dione Booze  . Colonoscopy  3/05    due again 2015  . Tonsillectomy  age 47   History   Social History  . Marital Status: Single    Spouse Name: N/A    Number of Children: N/A  . Years of Education: N/A   Occupational History  . Not on file.   Social History Main Topics  . Smoking status: Former Smoker    Quit date: 06/14/2000  . Smokeless tobacco: Not on file  . Alcohol Use: No  . Drug Use: No  . Sexually Active: Not on file   Other Topics Concern  . Not on file   Social History Narrative   Works at [employer]    Current outpatient prescriptions:amLODipine (NORVASC) 10 MG tablet, Take 10 mg by mouth daily., Disp: , Rfl: ;  aspirin 81 MG tablet, Take 81 mg by mouth daily.  , Disp: , Rfl: ;  atorvastatin (LIPITOR) 20 MG tablet, TAKE ONE TABLET BY MOUTH EVERY DAY, Disp: 90 tablet, Rfl: 0;  Cholecalciferol (VITAMIN D) 1000 UNITS capsule, Take 1,000 Units by mouth daily.  , Disp: , Rfl:  enalapril (VASOTEC) 10 MG tablet, TAKE ONE TABLET BY MOUTH EVERY DAY, Disp: 30 tablet, Rfl: 0;  furosemide (LASIX) 40 MG tablet, Take 40 mg by mouth daily. , Disp: , Rfl: ;  insulin glargine (LANTUS) 100  UNIT/ML injection, Inject 25 Units into the skin at bedtime. Patient uses vial of Lantus., Disp: 10 mL, Rfl: 3;  latanoprost (XALATAN) 0.005 % ophthalmic solution, Place 1 drop into both eyes at bedtime.  , Disp: , Rfl:  pioglitazone-metformin (ACTOPLUS MET) 15-850 MG per tablet, TAKE ONE TABLET BY MOUTH TWICE DAILY WITH A MEAL, Disp: 180 tablet, Rfl: 0;  vitamin B-12 (CYANOCOBALAMIN) 100 MCG tablet, Take 100 mcg by mouth daily.  , Disp: , Rfl:   Allergies  Allergen Reactions  . Ace Inhibitors     Angioedema (pt states PCP states lip swelling was from med, "but it was from lipstick")   ROS:  Denies fevers, URI symptoms, hearing/vision problems, cough.  Reports some SOB with walking, as per HPI.  Denies chest pain, palpitations, headaches, dizziness,  nausea, vomiting, bowel changes. Urinary frequency due to diuretics, denies dysuria.  Denies bleeding/bruising, skin rashes.  +R knee pain.  Denies injury or fall  PHYSICAL EXAM:  BP 130/64  Pulse 72  Ht 5\' 2"  (1.575 m)  Wt 261 lb (118.389 kg)  BMI 47.73 kg/m2 Well developed, pleasant obese female in no distress Neck: no lymphadenopathy, thyromegaly or carotid bruit Heart: regular rate and rhythm without murmur Lungs: clear bilaterally Abdomen: obese, soft, nontender Extremities: no edema, 2+ pulse.  Knee without effusion or warmth Skin: no rash Psych: normal mood, affect, hygiene and grooming Neuro: alert and oriented.  Cranial nerves grossly intact.  ASSESSMENT/PLAN:  Type II or unspecified type diabetes mellitus without mention of complication, not stated as uncontrolled - Plan: pioglitazone-metformin (ACTOPLUS MET) 15-850 MG per tablet  Mixed hyperlipidemia - Plan: atorvastatin (LIPITOR) 20 MG tablet  Essential hypertension, benign - Plan: amLODipine (NORVASC) 10 MG tablet, enalapril (VASOTEC) 10 MG tablet, EKG 12-Lead, EKG 12-Lead  DOE (dyspnea on exertion) - Plan: EKG 12-Lead, EKG 12-Lead  DOE--check EKG.  Reviewed atypical anginal symptoms. Pt insists that she is just tired because of the weight gain.  Chart reviewed.  Last CBC and TSH was done 10/2011.  Doesn't want drawn here bc of expense (can get done through work).  Rx written.  Will get her phone number so she can reschedule nutritionist appt. Increase exercise to at least 30 minutes daily (ideally 45-60).  Water aerobics and exercise bike will be easier on the knee than walking. Continue current medications.  Diabetes overall is adequately controlled, but A1c has been gradually creeping up. Blood pressures are under better control on higher doses--continue Lipids are at goal on current medications.  Increasing exercise will help raise the HDL (good kind--the higher the better)  F/u 3 months for med check, sooner  prn

## 2012-08-09 NOTE — Patient Instructions (Addendum)
Reschedule nutritionist appt (our office can give you the number) Increase exercise to at least 30 minutes daily (ideally 45-60).  Water aerobics and exercise bike will be easier on the knee than walking. Continue current medications. Diabetes overall is adequately controlled, but A1c has been gradually creeping up. Blood pressures are under better control Lipids are at goal on current medications.  Increasing exercise will help raise the HDL (good kind--the higher the better)  I am concerned about your fatigue and slight shortness of breath with exercise.  This can be a symptoms of angina (in women, diabetics).  I know that you feel it is related to your weight gain.  In which case, it should improve as you lose weight.  So, let's get the weight off!  If symptoms persist, you will need to be referred to a cardiologist for further evaluation, as you have multiple risk factors for heart disease (diabetes, high blood pressure, cholesterol, your weight, family history)

## 2012-08-17 DIAGNOSIS — E11319 Type 2 diabetes mellitus with unspecified diabetic retinopathy without macular edema: Secondary | ICD-10-CM

## 2012-08-17 HISTORY — DX: Type 2 diabetes mellitus with unspecified diabetic retinopathy without macular edema: E11.319

## 2012-08-23 ENCOUNTER — Encounter: Payer: Self-pay | Admitting: Family Medicine

## 2012-08-25 ENCOUNTER — Encounter: Payer: Self-pay | Admitting: Family Medicine

## 2012-12-21 ENCOUNTER — Encounter: Payer: Self-pay | Admitting: Family Medicine

## 2012-12-21 ENCOUNTER — Ambulatory Visit (INDEPENDENT_AMBULATORY_CARE_PROVIDER_SITE_OTHER): Payer: PRIVATE HEALTH INSURANCE | Admitting: Family Medicine

## 2012-12-21 VITALS — BP 142/70 | HR 76 | Ht 62.0 in | Wt 264.0 lb

## 2012-12-21 DIAGNOSIS — I10 Essential (primary) hypertension: Secondary | ICD-10-CM

## 2012-12-21 DIAGNOSIS — E782 Mixed hyperlipidemia: Secondary | ICD-10-CM

## 2012-12-21 DIAGNOSIS — R609 Edema, unspecified: Secondary | ICD-10-CM

## 2012-12-21 DIAGNOSIS — E119 Type 2 diabetes mellitus without complications: Secondary | ICD-10-CM

## 2012-12-21 NOTE — Patient Instructions (Addendum)
Try and cut back on the salt in your diet. Get compression stockings from medical supply store (knee high length should be fine). When you are just sitting, try and keep your legs elevated.  If your swelling isn't improving, then we will need to make the med changes we discussed (getting rid of the actos from the combination medication, and increasing the lantus dose).  Please check your sugars regularly, and periodically check your blood pressure.  2 Gram Low Sodium Diet A 2 gram sodium diet restricts the amount of sodium in the diet to no more than 2 g or 2000 mg daily. Limiting the amount of sodium is often used to help lower blood pressure. It is important if you have heart, liver, or kidney problems. Many foods contain sodium for flavor and sometimes as a preservative. When the amount of sodium in a diet needs to be low, it is important to know what to look for when choosing foods and drinks. The following includes some information and guidelines to help make it easier for you to adapt to a low sodium diet. QUICK TIPS  Do not add salt to food.  Avoid convenience items and fast food.  Choose unsalted snack foods.  Buy lower sodium products, often labeled as "lower sodium" or "no salt added."  Check food labels to learn how much sodium is in 1 serving.  When eating at a restaurant, ask that your food be prepared with less salt or none, if possible. READING FOOD LABELS FOR SODIUM INFORMATION The nutrition facts label is a good place to find how much sodium is in foods. Look for products with no more than 500 to 600 mg of sodium per meal and no more than 150 mg per serving. Remember that 2 g = 2000 mg. The food label may also list foods as:  Sodium-free: Less than 5 mg in a serving.  Very low sodium: 35 mg or less in a serving.  Low-sodium: 140 mg or less in a serving.  Light in sodium: 50% less sodium in a serving. For example, if a food that usually has 300 mg of sodium is changed  to become light in sodium, it will have 150 mg of sodium.  Reduced sodium: 25% less sodium in a serving. For example, if a food that usually has 400 mg of sodium is changed to reduced sodium, it will have 300 mg of sodium. CHOOSING FOODS Grains  Avoid: Salted crackers and snack items. Some cereals, including instant hot cereals. Bread stuffing and biscuit mixes. Seasoned rice or pasta mixes.  Choose: Unsalted snack items. Low-sodium cereals, oats, puffed wheat and rice, shredded wheat. English muffins and bread. Pasta. Meats  Avoid: Salted, canned, smoked, spiced, pickled meats, including fish and poultry. Bacon, ham, sausage, cold cuts, hot dogs, anchovies.  Choose: Low-sodium canned tuna and salmon. Fresh or frozen meat, poultry, and fish. Dairy  Avoid: Processed cheese and spreads. Cottage cheese. Buttermilk and condensed milk. Regular cheese.  Choose: Milk. Low-sodium cottage cheese. Yogurt. Sour cream. Low-sodium cheese. Fruits and Vegetables  Avoid: Regular canned vegetables. Regular canned tomato sauce and paste. Frozen vegetables in sauces. Olives. Rosita Fire. Relishes. Sauerkraut.  Choose: Low-sodium canned vegetables. Low-sodium tomato sauce and paste. Frozen or fresh vegetables. Fresh and frozen fruit. Condiments  Avoid: Canned and packaged gravies. Worcestershire sauce. Tartar sauce. Barbecue sauce. Soy sauce. Steak sauce. Ketchup. Onion, garlic, and table salt. Meat flavorings and tenderizers.  Choose: Fresh and dried herbs and spices. Low-sodium varieties of mustard and  ketchup. Lemon juice. Tabasco sauce. Horseradish. SAMPLE 2 GRAM SODIUM MEAL PLAN Breakfast / Sodium (mg)  1 cup low-fat milk / 143 mg  2 slices whole-wheat toast / 270 mg  1 tbs heart-healthy margarine / 153 mg  1 hard-boiled egg / 139 mg  1 small orange / 0 mg Lunch / Sodium (mg)  1 cup raw carrots / 76 mg   cup hummus / 298 mg  1 cup low-fat milk / 143 mg   cup red grapes / 2 mg  1  whole-wheat pita bread / 356 mg Dinner / Sodium (mg)  1 cup whole-wheat pasta / 2 mg  1 cup low-sodium tomato sauce / 73 mg  3 oz lean ground beef / 57 mg  1 small side salad (1 cup raw spinach leaves,  cup cucumber,  cup yellow bell pepper) with 1 tsp olive oil and 1 tsp red wine vinegar / 25 mg Snack / Sodium (mg)  1 container low-fat vanilla yogurt / 107 mg  3 graham cracker squares / 127 mg Nutrient Analysis  Calories: 2033  Protein: 77 g  Carbohydrate: 282 g  Fat: 72 g  Sodium: 1971 mg Document Released: 05/31/2005 Document Revised: 08/23/2011 Document Reviewed: 09/01/2009 Exit[employer] Patient Information 2014 [place], [place].

## 2012-12-21 NOTE — Progress Notes (Signed)
Chief Complaint  Patient presents with  . Diabetes    med check, brought in copy of labs that were drawn 12/18/12.   She is complaining of swelling in her feet.  She has noticed the swelling since amlodipine was increased to 10mg  (per her kidney doctor). Legs feel tight, not painful. Hasn't had any weeping, drainage or sores. She doesn't add salt, but has some canned foods (she rinses green beans), some occasional fast food, and packaged [place].  Swelling isn't too bad in the morning, but gets worse throughout the day.  She stepped on some glass in R foot a few weeks ago, but it has completely healed.    Hypertension follow-up:  Blood pressures elsewhere are reportedly "normal".  Denies dizziness, headaches, chest pain.  Denies side effects of medications.  Diabetes follow-up: She hasn't been checking her blood sugars recently.  Denies hypoglycemia.  Denies polydipsia and polyuria.  Last eye exam was in 06/2012.  Patient follows a low sugar diet and checks feet regularly without concerns.  Hyperlipidemia follow-up:  Patient is reportedly following a low-fat, low cholesterol diet.  Compliant with medications and denies medication side effects  She is decreasing portion sizes, cut out fried foods, limits sweets, and recently [facility] (1 month ago).  Past Medical History  Diagnosis Date  . Type II or unspecified type diabetes mellitus without mention of complication, not stated as uncontrolled   . Pure hypercholesterolemia   . Essential hypertension, benign   . Glaucoma   . Diabetic retinopathy   . CKD (chronic kidney disease) stage 3, GFR 30-59 ml/min     Dr. Lowell Guitar  . DM retinopathy 08/17/12    early   Past Surgical History  Procedure Laterality Date  . Cataract extraction, bilateral  2012    Dr. Dione Booze  . Colonoscopy  3/05    due again 2015  . Tonsillectomy  age 60   History   Social History  . Marital Status: Single    Spouse Name: N/A    Number of  Children: N/A  . Years of Education: N/A   Occupational History  . Not on file.   Social History Main Topics  . Smoking status: Former Smoker    Quit date: 06/14/2000  . Smokeless tobacco: Not on file  . Alcohol Use: No  . Drug Use: No  . Sexually Active: Not on file   Other Topics Concern  . Not on file   Social History Narrative   Works at [employer]   Current Outpatient Prescriptions on File Prior to Visit  Medication Sig Dispense Refill  . amLODipine (NORVASC) 10 MG tablet Take 1 tablet (10 mg total) by mouth daily.  90 tablet  1  . aspirin 81 MG tablet Take 81 mg by mouth daily.        Marland Kitchen atorvastatin (LIPITOR) 20 MG tablet TAKE ONE TABLET BY MOUTH EVERY DAY  90 tablet  1  . Cholecalciferol (VITAMIN D) 1000 UNITS capsule Take 1,000 Units by mouth daily.        . enalapril (VASOTEC) 10 MG tablet TAKE ONE TABLET BY MOUTH EVERY DAY  90 tablet  1  . furosemide (LASIX) 40 MG tablet Take 40 mg by mouth daily.       . insulin glargine (LANTUS) 100 UNIT/ML injection Inject 25 Units into the skin at bedtime. Patient uses vial of Lantus.  10 mL  3  . latanoprost (XALATAN) 0.005 % ophthalmic solution Place 1  drop into both eyes at bedtime.        . pioglitazone-metformin (ACTOPLUS MET) 15-850 MG per tablet TAKE ONE TABLET BY MOUTH TWICE DAILY WITH A MEAL  180 tablet  1  . vitamin B-12 (CYANOCOBALAMIN) 100 MCG tablet Take 100 mcg by mouth daily.         No current facility-administered medications on file prior to visit.   Allergies  Allergen Reactions  . Ace Inhibitors     Angioedema (pt states PCP states lip swelling was from med, "but it was from lipstick")   ROS:  Denies fevers, URI symptoms, cough, shortness of breath, chest pain, palpitations headaches, dizziness, nausea, vomiting, heartburn, bowel changes, urinary complaints, bleeding/bruising, rashes or other complaints except as noted per HPI. Weight gain--up 3 pounds from last visit, but up 10-20 pounds from this time  last year.  PHYSICAL EXAM: BP 142/70  Pulse 76  Ht 5\' 2"  (1.575 m)  Wt 264 lb (119.75 kg)  BMI 48.27 kg/m2 Pleasant, obese female in no distress Neck: no lymphadenopathy, thyromegaly or mass Heart: regular rate and rhythm without murmur Lungs: clear bilaterally Abdomen: obese, soft, nontender, no mass Extremities:  Normal diabetic foot exam.  Trace to 1+ pretibial edema, starting mid tibia, 1+ at ankle.  Skin intact, without rash, hyperpigmentation, weeping or lesions Neuro: alert and oriented.  Cranial nerves grossly intact. Normal gait Psych: normal mood, affect, hygiene and grooming  Labs done through [facility] 12/18/2012:  Normal CBC c-met:  Glu 114, BUN/Cr 36/1.35 Normal LFT's Chol 149/TG 98/HDL 48/ LDL 81  Normal TSH A1c 6.9 (up from 6.5)  Vitamin D-OH 40  ASSESSMENT/PLAN:  Type II or unspecified type diabetes mellitus without mention of complication, not stated as uncontrolled - Plan: [facility] Diabetes Foot Exam, Microalbumin / creatinine urine ratio  Essential hypertension, benign  Mixed hyperlipidemia  CKD (chronic kidney disease), stage III  Peripheral edema  DM--slightly worse, but still controlled.  Actos, along with amlodipine, may be contributing to her edema.  Since BP's are okay on current regimen, would prefer to change the actos (and increase the lantus) rather than cut back on amlodipine dose, to see if that helps with the swelling. She was interested, at first, in med changes to help with swelling.  Swelling is likely multifactorial, including amlodipine and actos, plus possibility of venous insufficiency contributing to dependent edema, and sodium intake also a contributing factor.  She apparently just filled the actoplus met for 90 day supply yesterday, so opts to hold off on making the med change right now. Will try low sodium diet, elevating legs, and using compression stockings for the next 3 months, leaving her current meds the same.  Encouraged her to  periodically check her blood sugars.  F/u 3 months (nonfasting, A1c to be done at visit, no other labs needed prior)

## 2012-12-22 ENCOUNTER — Encounter: Payer: Self-pay | Admitting: Family Medicine

## 2012-12-22 DIAGNOSIS — R609 Edema, unspecified: Secondary | ICD-10-CM | POA: Insufficient documentation

## 2012-12-22 LAB — MICROALBUMIN / CREATININE URINE RATIO
Creatinine, Urine: 130.4 mg/dL
Microalb, Ur: 0.5 mg/dL (ref 0.00–1.89)

## 2012-12-27 ENCOUNTER — Encounter: Payer: Self-pay | Admitting: Family Medicine

## 2013-02-26 ENCOUNTER — Other Ambulatory Visit: Payer: Self-pay | Admitting: Family Medicine

## 2013-04-19 ENCOUNTER — Ambulatory Visit: Payer: PRIVATE HEALTH INSURANCE | Admitting: Family Medicine

## 2013-05-01 ENCOUNTER — Telehealth: Payer: Self-pay | Admitting: Internal Medicine

## 2013-05-01 NOTE — Telephone Encounter (Signed)
Pt uses this company. Test 1 time daily with 40 units, pt uses pentips 1x day and use the small size. She tests once day and she would like a 90 day supply

## 2013-05-01 NOTE — Telephone Encounter (Signed)
Got a fax from [facility] for rx for syringes.i have left a message for pt to call me back to see if she uses this company and she needs rx for syringes. If so, does patient use syringes(how many times a day and how many units), does pt use pentips (how many times a day and what size). How often does she test for her blood glucose testing.

## 2013-05-22 ENCOUNTER — Telehealth: Payer: Self-pay | Admitting: Internal Medicine

## 2013-05-22 NOTE — Telephone Encounter (Signed)
Pt needs a rx for insulin to [facility]. Form to be filled out for the insulin is red folder on bookshelf

## 2013-05-23 NOTE — Telephone Encounter (Signed)
Done and faxed

## 2013-05-23 NOTE — Telephone Encounter (Signed)
She was due for a 3 mo f/u in October.  Looks like she is scheduled for the end of this month.  Looks like we got same call last month, and Martie Lee took care of--look at prior message.  I signed form (and filled out type of insulin), but you need to enter what supplies she needs.  Thanks

## 2013-05-28 ENCOUNTER — Other Ambulatory Visit: Payer: Self-pay | Admitting: Family Medicine

## 2013-05-29 ENCOUNTER — Other Ambulatory Visit: Payer: Self-pay | Admitting: Family Medicine

## 2013-05-29 ENCOUNTER — Telehealth: Payer: Self-pay | Admitting: Internal Medicine

## 2013-05-29 DIAGNOSIS — E119 Type 2 diabetes mellitus without complications: Secondary | ICD-10-CM

## 2013-05-29 MED ORDER — ATORVASTATIN CALCIUM 20 MG PO TABS: 20.0000 mg | ORAL_TABLET | Freq: Two times a day (BID) | ORAL | Status: DC

## 2013-05-29 MED ORDER — PIOGLITAZONE HCL-METFORMIN HCL 15-850 MG PO TABS: ORAL_TABLET | ORAL | Status: DC

## 2013-05-29 NOTE — Telephone Encounter (Signed)
Refill request for lipitor 20mg , actoplus met 15-850mg  enalapril 10mg  to [facility] to [place]

## 2013-05-29 NOTE — Telephone Encounter (Signed)
Rx refills sent to [facility] pharmacy per patients request last seen 12/2012. CLS

## 2013-06-11 ENCOUNTER — Encounter: Payer: Self-pay | Admitting: Family Medicine

## 2013-06-11 ENCOUNTER — Ambulatory Visit (INDEPENDENT_AMBULATORY_CARE_PROVIDER_SITE_OTHER): Payer: PRIVATE HEALTH INSURANCE | Admitting: Family Medicine

## 2013-06-11 VITALS — BP 140/70 | HR 88 | Ht 63.0 in | Wt 252.0 lb

## 2013-06-11 DIAGNOSIS — E119 Type 2 diabetes mellitus without complications: Secondary | ICD-10-CM

## 2013-06-11 DIAGNOSIS — R609 Edema, unspecified: Secondary | ICD-10-CM

## 2013-06-11 DIAGNOSIS — M25561 Pain in right knee: Secondary | ICD-10-CM

## 2013-06-11 DIAGNOSIS — E1139 Type 2 diabetes mellitus with other diabetic ophthalmic complication: Secondary | ICD-10-CM

## 2013-06-11 DIAGNOSIS — E11319 Type 2 diabetes mellitus with unspecified diabetic retinopathy without macular edema: Secondary | ICD-10-CM

## 2013-06-11 DIAGNOSIS — I10 Essential (primary) hypertension: Secondary | ICD-10-CM

## 2013-06-11 DIAGNOSIS — E782 Mixed hyperlipidemia: Secondary | ICD-10-CM

## 2013-06-11 DIAGNOSIS — M25569 Pain in unspecified knee: Secondary | ICD-10-CM

## 2013-06-11 MED ORDER — ATORVASTATIN CALCIUM 20 MG PO TABS: 20.0000 mg | ORAL_TABLET | Freq: Every day | ORAL | Status: DC

## 2013-06-11 NOTE — Patient Instructions (Signed)
Continue your current medications, and low sodium diet.  Continue to use compression stockings as needed, along with leg elevation when swollen. Continue exercise.  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  Use tylenol as needed for pain.  Keep up the good work.  Periodically check your blood pressures and sugars (goal BP is <130-135/80-85, and morning sugar <120-140; sugars 2 hours after a meal should be <140-160)

## 2013-06-11 NOTE — Progress Notes (Signed)
Chief Complaint  Patient presents with  . non fasting DM and swelling    Diabetes check and swelling has gotten much better. pt had labs done at work   Patient presents for follow-up on her diabetes, blood pressure, edema.  At her last visit, her A1c had increased to 6.9. We felt that Actos, along with increased dose of amlodipine (per renal), may be contributing to her edema. Since BP's are okay on current regimen, would prefer to change the actos (and increase the lantus) rather than cut back on amlodipine dose, to see if that helps with the swelling.  However, she apparently had just filled the actoplus met for 90 day supply, so opted to hold off on making med changes.  She was instead trying low sodium diet, elevating legs, and using compression stockings, leaving her meds the same.  She wore the compression stockings for 2-3 weeks, until her swelling resolved.  Now they are too loose, and she hasn't needed them. Her edema is much better.  She has lost 12 pounds since her last visit.  She has also continued to limit her portion sizes, cut out fried foods, limit sweets.  [facility]  Diabetes follow-up: She is checking her sugars in the mornings, ranging 110-140. Denies hypoglycemia. Denies polydipsia and polyuria. Last eye exam was in 08/2012 (per records received), but pt reports she goes every 6 months, so likely had a visit in September; she states she is due again in March.  Per last notes (08/2012) she was found to have early retinopathy changes. Patient follows a low sugar diet and checks feet regularly without concerns.   Hyperlipidemia follow-up: Patient is reportedly following a low-fat, low cholesterol diet. Compliant with medications and denies medication side effects.  Hypertension follow-up: Blood pressures elsewhere are reportedly "normal", she can't remember values, just that this morning was 140/72.  Denies dizziness, headaches, chest pain. Denies side  effects of medications. No further edema  She got her flu shot at work.  She has pain in her right knee.  She is seeing ortho at [facility]  She recalls getting injection in October, but that it hasn't really helped much.  She was told to use the exercise bike, and has been.  Past Medical History  Diagnosis Date  . Type II or unspecified type diabetes mellitus without mention of complication, not stated as uncontrolled   . Pure hypercholesterolemia   . Essential hypertension, benign   . Glaucoma   . Diabetic retinopathy   . CKD (chronic kidney disease) stage 3, GFR 30-59 ml/min     Dr. Lowell Guitar  . DM retinopathy 08/17/12    early   Past Surgical History  Procedure Laterality Date  . Cataract extraction, bilateral  2012    Dr. Dione Booze  . Colonoscopy  3/05    due again 2015  . Tonsillectomy  age 11   History   Social History  . Marital Status: Single    Spouse Name: N/A    Number of Children: N/A  . Years of Education: N/A   Occupational History  . Not on file.   Social History Main Topics  . Smoking status: Former Smoker    Quit date: 06/14/2000  . Smokeless tobacco: Not on file  . Alcohol Use: No  . Drug Use: No  . Sexual Activity: Not on file   Other Topics Concern  . Not on file   Social History Narrative   Works at [employer]  [place]   Current outpatient prescriptions:amLODipine (NORVASC) 10 MG tablet, Take 1 tablet (10 mg total) by mouth daily., Disp: 90 tablet, Rfl: 1;  aspirin 81 MG tablet, Take 81 mg by mouth daily.  , Disp: , Rfl: ;  atorvastatin (LIPITOR) 20 MG tablet, Take 1 tablet (20 mg total) by mouth 2 (two) times daily., Disp: 90 tablet, Rfl: 0;  Cholecalciferol (VITAMIN D) 1000 UNITS capsule, Take 1,000 Units by mouth daily.  , Disp: , Rfl:  enalapril (VASOTEC) 10 MG tablet, TAKE ONE TABLET BY MOUTH ONCE DAILY, Disp: 90 tablet, Rfl: 1;  furosemide (LASIX) 40 MG tablet, Take 40 mg by mouth daily. , Disp: , Rfl: ;  insulin glargine (LANTUS) 100 UNIT/ML  injection, Inject 25 Units into the skin at bedtime. Patient uses vial of Lantus., Disp: 10 mL, Rfl: 3;  latanoprost (XALATAN) 0.005 % ophthalmic solution, Place 1 drop into both eyes at bedtime.  , Disp: , Rfl:  pioglitazone-metformin (ACTOPLUS MET) 15-850 MG per tablet, TAKE ONE TABLET BY MOUTH TWICE DAILY WITH A MEAL, Disp: 180 tablet, Rfl: 1;  vitamin B-12 (CYANOCOBALAMIN) 100 MCG tablet, Take 100 mcg by mouth daily.  , Disp: , Rfl:   No Known Allergies  ROS:  Denies fevers, chills, headaches, dizziness, URI symptoms, cough, shortness of breath, chest pain, palpitations, nausea, vomiting, diarrhea.  Denies bleeding.  Bruises easily (unchanged).  Swelling has resolved.  Moods are good, no insomnia.  Having some R knee pain.  She got cortisone injection in October, but hasn't helped much.  PHYSICAL EXAM:  BP 122/72  Pulse 88  Ht 5\' 3"  (1.6 m)  Wt 252 lb (114.306 kg)  BMI 44.65 kg/m2 140/70 on repeat by MD, [place] Pleasant, obese female in no distress  Neck: no lymphadenopathy, thyromegaly or mass, no carotid bruit Heart: regular rate and rhythm without murmur  Lungs: clear bilaterally  Abdomen: obese, soft, nontender, no mass  Extremities: trace pretibial edema bilaterally.  2+ pulses Neuro: alert and oriented. Cranial nerves grossly intact. Normal gait  Psych: normal mood, affect, hygiene and grooming  Lab results done through her job on 06/04/13: c-met showed glucose 80, BUN/Cr 29/1.15, K+ 5.2, normal LFT's. Lipids: TC 165, TG 100, HDL 54, LDL 91 HbA1c 6.7  ASSESSMENT/PLAN:  Type II or unspecified type diabetes mellitus without mention of complication, not stated as uncontrolled - controlled  Essential hypertension, benign - borderline.  continue low sodium diet; weight loss encouraged.  continue current meds  Mixed hyperlipidemia - at goal - Plan: atorvastatin (LIPITOR) 20 MG tablet  Diabetic retinopathy  Peripheral edema - improved.  continue exercise, low sodium diet,  furosemide, compression stockings  Knee pain, right - suspect arthritis.  weight loss encouraged.  tylenol prn (no NSAIDs).  continue exercise bike   Pt hasn't had CPE in a while, doesn't have a GYN.  Pt asked to schedule for CPE rather than just med check for 6 months.  She will get her labs done prior, at her work, per usual.

## 2013-06-21 ENCOUNTER — Encounter: Payer: Self-pay | Admitting: Family Medicine

## 2013-08-30 LAB — HM DIABETES EYE EXAM

## 2013-09-04 ENCOUNTER — Encounter: Payer: Self-pay | Admitting: Internal Medicine

## 2013-09-05 ENCOUNTER — Encounter: Payer: Self-pay | Admitting: *Deleted

## 2013-11-02 ENCOUNTER — Telehealth: Payer: Self-pay | Admitting: Family Medicine

## 2013-11-02 DIAGNOSIS — E119 Type 2 diabetes mellitus without complications: Secondary | ICD-10-CM

## 2013-11-02 MED ORDER — INSULIN GLARGINE 100 UNIT/ML ~~LOC~~ SOLN: 25.0000 [IU] | Freq: Every day | SUBCUTANEOUS | Status: DC

## 2013-11-02 NOTE — Telephone Encounter (Signed)
done

## 2013-11-02 NOTE — Telephone Encounter (Signed)
Pt needs RF Lantus to [facility].  She has appt 12/10/13 CPE

## 2013-11-26 ENCOUNTER — Encounter: Payer: Self-pay | Admitting: Internal Medicine

## 2013-11-26 LAB — HM COLONOSCOPY

## 2013-12-02 ENCOUNTER — Other Ambulatory Visit: Payer: Self-pay | Admitting: Medical

## 2013-12-02 ENCOUNTER — Other Ambulatory Visit: Payer: Self-pay | Admitting: Family Medicine

## 2013-12-10 ENCOUNTER — Encounter: Payer: Self-pay | Admitting: Family Medicine

## 2013-12-10 ENCOUNTER — Ambulatory Visit (INDEPENDENT_AMBULATORY_CARE_PROVIDER_SITE_OTHER): Payer: PRIVATE HEALTH INSURANCE | Admitting: Family Medicine

## 2013-12-10 ENCOUNTER — Other Ambulatory Visit (HOSPITAL_COMMUNITY)
Admission: RE | Admit: 2013-12-10 | Discharge: 2013-12-10 | Disposition: A | Discharge status: Home / Self Care | Payer: PRIVATE HEALTH INSURANCE | Source: Ambulatory Visit | Attending: Family Medicine | Admitting: Family Medicine

## 2013-12-10 VITALS — BP 110/60 | HR 72 | Ht 62.0 in | Wt 262.0 lb

## 2013-12-10 DIAGNOSIS — Z23 Encounter for immunization: Secondary | ICD-10-CM

## 2013-12-10 DIAGNOSIS — E1121 Type 2 diabetes mellitus with diabetic nephropathy: Secondary | ICD-10-CM | POA: Insufficient documentation

## 2013-12-10 DIAGNOSIS — N183 Chronic kidney disease, stage 3 unspecified: Secondary | ICD-10-CM

## 2013-12-10 DIAGNOSIS — Z794 Long term (current) use of insulin: Secondary | ICD-10-CM

## 2013-12-10 DIAGNOSIS — E782 Mixed hyperlipidemia: Secondary | ICD-10-CM

## 2013-12-10 DIAGNOSIS — E11319 Type 2 diabetes mellitus with unspecified diabetic retinopathy without macular edema: Secondary | ICD-10-CM

## 2013-12-10 DIAGNOSIS — Z01419 Encounter for gynecological examination (general) (routine) without abnormal findings: Secondary | ICD-10-CM | POA: Insufficient documentation

## 2013-12-10 DIAGNOSIS — E119 Type 2 diabetes mellitus without complications: Secondary | ICD-10-CM

## 2013-12-10 DIAGNOSIS — E1139 Type 2 diabetes mellitus with other diabetic ophthalmic complication: Secondary | ICD-10-CM

## 2013-12-10 DIAGNOSIS — I10 Essential (primary) hypertension: Secondary | ICD-10-CM

## 2013-12-10 DIAGNOSIS — Z0000 Encounter for general adult medical examination without abnormal findings: Secondary | ICD-10-CM

## 2013-12-10 DIAGNOSIS — Z1151 Encounter for screening for human papillomavirus (HPV): Secondary | ICD-10-CM | POA: Insufficient documentation

## 2013-12-10 DIAGNOSIS — R87810 Cervical high risk human papillomavirus (HPV) DNA test positive: Secondary | ICD-10-CM | POA: Insufficient documentation

## 2013-12-10 LAB — POCT URINALYSIS DIPSTICK
Bilirubin, UA: NEGATIVE
Blood, UA: NEGATIVE
Glucose, UA: NEGATIVE
Ketones, UA: NEGATIVE
Nitrite, UA: NEGATIVE
PROTEIN UA: NEGATIVE
SPEC GRAV UA: 1.015
Urobilinogen, UA: NEGATIVE
pH, UA: 5

## 2013-12-10 NOTE — Progress Notes (Signed)
Chief Complaint  Patient presents with  . Annual Exam    fasting annual exam with pap/pelvic and med check.(labs on front of chart) Did not do eye exam, patient sees Dr.Groat twice a year. UA showed trace leuks, no symptoms. Patient has no concerns today.    Kerri Richardson is a 73 y.o. female who presents for a complete physical.  She is also here for 6 month med check on her chronic medical problems.  She had labs done through work, and they were faxed over last week.  Results are: CBC was normal.  c-met showed glucose of 118, BUN/Cr 39/1.55, otherwise normal.  Lipids: total chol 143, TG 167, HDL 43, LDL 67.  A1c 7.0.  Vitamin D-OH 38, TSH 3.360  CKD--sees Dr. [name] yearly.  No med changes were made.  Diabetes follow-up: She is checking her sugars in the mornings, ranging 120-150.  Denies hypoglycemia. Denies polydipsia and polyuria. Last eye exam was in 08/2013, and early retinopathy changes were seen. Patient follows a low sugar diet and checks feet regularly without concerns.   Hyperlipidemia follow-up: Patient is reportedly following a low-fat, low cholesterol diet. Compliant with medications and denies medication side effects.   Hypertension follow-up: Blood pressures elsewhere are not checked elsewhere, her machine broke.  Occasionally it is checked at work, and it was "good" when she was at the doctor last week ([facility]). Denies dizziness, headaches, chest pain. Denies side effects of medications. She has had some recurrence of the swelling in her feet, which is better when she is wearing her compression stockings (not wearing today).  She has pain in her right knee. She previously saw ortho at [facility]. She recalls getting injection in October, but that it hadn't really helped much. She then fell in February, at work.  Seeing [facility] physician who finally ordered MRI, which showed a tear.  She is being referred to ortho.  She has had increased pain for the last 2 weeks, such that she  hasn't been able to go to her exercise classes.   Immunization History  Administered Date(s) Administered  . Influenza Split 05/05/2011, 03/23/2012  . Pneumococcal Polysaccharide-23 01/26/2010  . Tdap 10/19/2007  . Zoster 10/19/2007  she got her flu shot at work Last Pap smear: "I have no idea", maybe 10 years Last mammogram: 01/2010 Last colonoscopy: 11/26/13, +polyp, due again in 5 years Last DEXA: 01/2010--normal Ophtho: 08/2013 (+ retinopathy) Dentist: has dentures, and just one tooth; last seen 02/2013 (got new dentures) Exercise:  4x/week at senior center, but she hasn't been in the last 2 weeks due to flaring of knee pain.  Past Medical History  Diagnosis Date  . Type II or unspecified type diabetes mellitus without mention of complication, not stated as uncontrolled   . Pure hypercholesterolemia   . Essential hypertension, benign   . Glaucoma   . Diabetic retinopathy   . CKD (chronic kidney disease) stage 3, GFR 30-59 ml/min     Dr. [name]  . DM retinopathy 08/17/12    early   Past Surgical History  Procedure Laterality Date  . Cataract extraction, bilateral  2012    Dr. [name]  . Colonoscopy  3/05, 11/2013    due again 2020  . Tonsillectomy  age 40  . Brain surgery  1969    Burr holes in skull to alleviate pressure on the brain (no shunt)   History   Social History  . Marital Status: Single    Spouse Name: N/A  Number of Children: N/A  . Years of Education: N/A   Occupational History  . Not on file.   Social History Main Topics  . Smoking status: Former Smoker    Quit date: 06/14/2000  . Smokeless tobacco: Never Used  . Alcohol Use: No  . Drug Use: No  . Sexual Activity: Not Currently   Other Topics Concern  . Not on file   Social History Narrative   Works at [employer]. Lives alone.  She has a sister in [place].  No children   Family History  Problem Relation Age of Onset  . Hypertension Mother   . Cancer Mother     lung (nonsmoker)  . Stroke  Father   . Hypertension Father   . Diabetes Father   . Hypertension Sister   . Diabetes Sister   . Heart disease Sister   . Hypertension Sister    Outpatient Encounter Prescriptions as of 12/10/2013  Medication Sig  . amLODipine (NORVASC) 10 MG tablet Take 1 tablet (10 mg total) by mouth daily.  Marland Kitchen aspirin 81 MG tablet Take 81 mg by mouth daily.    Marland Kitchen atorvastatin (LIPITOR) 20 MG tablet Take 1 tablet (20 mg total) by mouth daily.  . Cholecalciferol (VITAMIN D) 1000 UNITS capsule Take 1,000 Units by mouth daily.    . enalapril (VASOTEC) 10 MG tablet TAKE ONE TABLET BY MOUTH ONCE DAILY  . furosemide (LASIX) 40 MG tablet Take 40 mg by mouth daily.   . insulin glargine (LANTUS) 100 UNIT/ML injection Inject 0.25 mLs (25 Units total) into the skin at bedtime. Patient uses vial of Lantus.  Marland Kitchen latanoprost (XALATAN) 0.005 % ophthalmic solution Place 1 drop into both eyes at bedtime.    . pioglitazone-metformin (ACTOPLUS MET) 15-850 MG per tablet TAKE ONE TABLET BY MOUTH TWICE DAILY WITH  A  MEAL  . vitamin B-12 (CYANOCOBALAMIN) 100 MCG tablet Take 100 mcg by mouth daily.    she occasionally takes a calcium pill, not daily  ROS:  The patient denies anorexia, fever, weight changes, headaches,  vision changes, decreased hearing, ear pain, sore throat, breast concerns, chest pain, palpitations, dizziness, syncope, dyspnea on exertion, cough, swelling, nausea, vomiting, diarrhea, constipation, abdominal pain, melena, hematochezia, indigestion/heartburn, hematuria, incontinence, dysuria, vaginal bleeding, discharge, odor or itch, genital lesions, joint pains, numbness, tingling, weakness, tremor, suspicious skin lesions, depression, anxiety, abnormal bleeding/bruising, or enlarged lymph nodes. She regained the weight that she lost last year. +R knee pain  PHYSICAL EXAM:  BP 110/60  Pulse 72  Ht '5\' 2"'  (1.575 m)  Wt 262 lb (118.842 kg)  BMI 47.91 kg/m2  General Appearance:    Alert, cooperative, no  distress, appears stated age  Head:    Normocephalic, without obvious abnormality, atraumatic  Eyes:    PERRL, conjunctiva/corneas clear, EOM's intact, fundi not well visualized today  Ears:    Normal TM's and external ear canals  Nose:   Nares normal, mucosa normal, no drainage or sinus   tenderness  Throat:   Lips, mucosa, and tongue normal; upper dentures, edentulous on bottom  Neck:   Supple, no lymphadenopathy;  thyroid:  no   enlargement/tenderness/nodules; no carotid   bruit or JVD  Back:    Spine nontender, no curvature, ROM normal, no CVA     tenderness  Lungs:     Clear to auscultation bilaterally without wheezes, rales or     ronchi; respirations unlabored  Chest Wall:    No tenderness or deformity  Heart:    Regular rate and rhythm, S1 and S2 normal, no murmur, rub   or gallop  Breast Exam:    No tenderness, masses, or nipple discharge or inversion.      No axillary lymphadenopathy  Abdomen:     Soft, obese, non-tender, nondistended, normoactive bowel sounds, no masses, no hepatosplenomegaly  Genitalia:    Normal external genitalia without lesions.  BUS and vagina normal; cervix without lesions, or cervical motion tenderness. No abnormal vaginal discharge. Bimanual exam was very limited due to body habitus.  No appreciable masses, nontender.  Pap performed--stenotic cervical os.  Rectal:    Normal tone, no masses or tenderness; guaiac negative stool  Extremities:   No clubbing, cyanosis.  1+ pitting edema pretibially, R slightly more than L  Pulses:   2+ and symmetric all extremities  Skin:   Skin color, texture, turgor normal, no rashes or lesions  Lymph nodes:   Cervical, supraclavicular, and axillary nodes normal  Neurologic:   CNII-XII intact, normal strength, sensation and gait; reflexes 2+ and symmetric throughout          Psych:   Normal mood, affect, hygiene and grooming.    Normal diabetic foot exam  ASSESSMENT/PLAN:  Routine general medical examination at a health  care facility - Plan: POCT Urinalysis Dipstick, Cytology - PAP [place]  CKD (chronic kidney disease), stage III - stable  Mixed hyperlipidemia - borderline elevated TG.  Continue lowfat, low cholesterol diet, current meds. LDL at goal  Essential hypertension, benign - controlled  Type 2 diabetes, controlled, with retinopathy - borderline control.  Daily exercise and weight loss encouraged. Continue current meds  Need for prophylactic vaccination against Streptococcus pneumoniae (pneumococcus) - Plan: Pneumococcal conjugate vaccine 13-valent  Type II or unspecified type diabetes mellitus without mention of complication, not stated as uncontrolled - Plan: Microalbumin / creatinine urine ratio, HM Diabetes Foot Exam  Discussed monthly self breast exams and yearly mammograms; at least 30 minutes of aerobic activity at least 5 days/week, weight-bearing exercise 2x/week; proper sunscreen use reviewed; healthy diet, including goals of calcium and vitamin D intake and alcohol recommendations (less than or equal to 1 drink/day) reviewed; regular seatbelt use; changing batteries in smoke detectors.  Immunization recommendations discussed--yearly high dose flu shots, Prevnar-13 today.  Colonoscopy recommendations reviewed--recent, UTD.  Borderline control of DM--Keep record of sugars (discussed columns of date/morning/evening/comments)  Strongly encouraged weight loss  She got all meds refilled last week, doesn't need anything right now, but pharmacy will contact us when refills are needed (cannot tell from computer, doesn't seem UTD re: refills, I just see the enalapril done for #90 last week).   F/u 6 months for med check, sooner prn

## 2013-12-10 NOTE — Patient Instructions (Addendum)
  HEALTH MAINTENANCE RECOMMENDATIONS:  It is recommended that you get at least 30 minutes of aerobic exercise at least 5 days/week (for weight loss, you may need as much as 60-90 minutes). This can be any activity that gets your heart rate up. This can be divided in 10-15 minute intervals if needed, but try and build up your endurance at least once a week.  Weight bearing exercise is also recommended twice weekly.  Eat a healthy diet with lots of vegetables, fruits and fiber.  "Colorful" foods have a lot of vitamins (ie green vegetables, tomatoes, red peppers, etc).  Limit sweet tea, regular sodas and alcoholic beverages, all of which has a lot of calories and sugar.  Up to 1 alcoholic drink daily may be beneficial for women (unless trying to lose weight, watch sugars).  Drink a lot of water.  Calcium recommendations are 1200-1500 mg daily (1500 mg for postmenopausal women or women without ovaries), and vitamin D 1000 IU daily.  This should be obtained from diet and/or supplements (vitamins), and calcium should not be taken all at once, but in divided doses.  Monthly self breast exams and yearly mammograms for women over the age of 71 is recommended.  Sunscreen of at least SPF 30 should be used on all sun-exposed parts of the skin when outside between the hours of 10 am and 4 pm (not just when at beach or pool, but even with exercise, golf, tennis, and yard work!)  Use a sunscreen that says "broad spectrum" so it covers both UVA and UVB rays, and make sure to reapply every 1-2 hours.  Remember to change the batteries in your smoke detectors when changing your clock times in the spring and fall.  Use your seat belt every time you are in a car, and please drive safely and not be distracted with cell phones and texting while driving.  Continue to wear your compression stockings, and follow a low sodium diet.

## 2013-12-11 ENCOUNTER — Other Ambulatory Visit: Payer: Self-pay

## 2013-12-11 DIAGNOSIS — Z1231 Encounter for screening mammogram for malignant neoplasm of breast: Secondary | ICD-10-CM

## 2013-12-11 LAB — MICROALBUMIN / CREATININE URINE RATIO
CREATININE, URINE: 148.5 mg/dL
MICROALB UR: 0.65 mg/dL (ref 0.00–1.89)
Microalb Creat Ratio: 4.4 mg/g (ref 0.0–30.0)

## 2013-12-11 LAB — CYTOLOGY - PAP

## 2013-12-13 ENCOUNTER — Other Ambulatory Visit: Payer: Self-pay | Admitting: *Deleted

## 2013-12-17 ENCOUNTER — Encounter: Payer: Self-pay | Admitting: Family Medicine

## 2013-12-19 ENCOUNTER — Ambulatory Visit
Admission: RE | Admit: 2013-12-19 | Discharge: 2013-12-19 | Disposition: A | Discharge status: Home / Self Care | Payer: PRIVATE HEALTH INSURANCE | Source: Ambulatory Visit

## 2013-12-19 DIAGNOSIS — Z1231 Encounter for screening mammogram for malignant neoplasm of breast: Secondary | ICD-10-CM

## 2013-12-31 ENCOUNTER — Other Ambulatory Visit: Payer: Self-pay | Admitting: Family Medicine

## 2014-02-12 ENCOUNTER — Telehealth: Payer: Self-pay | Admitting: Family Medicine

## 2014-02-12 DIAGNOSIS — E782 Mixed hyperlipidemia: Secondary | ICD-10-CM

## 2014-02-13 MED ORDER — ENALAPRIL MALEATE 10 MG PO TABS: ORAL_TABLET | ORAL | Status: DC

## 2014-02-13 MED ORDER — ATORVASTATIN CALCIUM 20 MG PO TABS: 20.0000 mg | ORAL_TABLET | Freq: Every day | ORAL | Status: DC

## 2014-02-13 NOTE — Telephone Encounter (Signed)
Yes okay for all (I guess she must need a new meter)

## 2014-02-13 NOTE — Telephone Encounter (Signed)
Are all these ok to send in? (new meter and supplies, mainly in question).

## 2014-02-20 ENCOUNTER — Telehealth: Payer: Self-pay | Admitting: Family Medicine

## 2014-02-20 NOTE — Telephone Encounter (Signed)
Faxed request back.

## 2014-03-04 ENCOUNTER — Telehealth: Payer: Self-pay | Admitting: Family Medicine

## 2014-03-04 NOTE — Telephone Encounter (Signed)
Pt dropped off medical clearance letter for you to complete.  (put in your folder) Please fax to [facility] when complete.

## 2014-03-11 NOTE — Telephone Encounter (Signed)
Done today.

## 2014-03-15 ENCOUNTER — Telehealth: Payer: Self-pay | Admitting: Cardiovascular Disease

## 2014-03-15 NOTE — Telephone Encounter (Signed)
Closed encounter °

## 2014-03-25 ENCOUNTER — Encounter: Payer: Self-pay | Admitting: Family Medicine

## 2014-03-25 DIAGNOSIS — I129 Hypertensive chronic kidney disease with stage 1 through stage 4 chronic kidney disease, or unspecified chronic kidney disease: Secondary | ICD-10-CM | POA: Insufficient documentation

## 2014-03-25 DIAGNOSIS — N1832 Chronic kidney disease, stage 3b: Secondary | ICD-10-CM | POA: Insufficient documentation

## 2014-03-25 DIAGNOSIS — E119 Type 2 diabetes mellitus without complications: Secondary | ICD-10-CM | POA: Insufficient documentation

## 2014-03-25 DIAGNOSIS — Z794 Long term (current) use of insulin: Secondary | ICD-10-CM

## 2014-04-05 ENCOUNTER — Encounter: Payer: Self-pay | Admitting: Internal Medicine

## 2014-04-08 ENCOUNTER — Ambulatory Visit: Payer: PRIVATE HEALTH INSURANCE | Admitting: Cardiovascular Disease

## 2014-04-11 ENCOUNTER — Other Ambulatory Visit: Payer: Self-pay | Admitting: Family Medicine

## 2014-04-17 ENCOUNTER — Telehealth: Payer: Self-pay | Admitting: Internal Medicine

## 2014-04-17 MED ORDER — PIOGLITAZONE HCL-METFORMIN HCL 15-850 MG PO TABS: ORAL_TABLET | ORAL | Status: DC

## 2014-04-17 NOTE — Telephone Encounter (Signed)
Sent for 90 day supply

## 2014-04-17 NOTE — Telephone Encounter (Signed)
Refill request for piogliatazone 15mg ,metformin 850mg  to [facility]

## 2014-04-18 ENCOUNTER — Telehealth: Payer: Self-pay | Admitting: Internal Medicine

## 2014-04-18 NOTE — Telephone Encounter (Signed)
Pt does NOT use [facility] for her diabetic supplies. She goes through [facility] for her supplies

## 2014-04-19 ENCOUNTER — Other Ambulatory Visit: Payer: Self-pay | Admitting: Family Medicine

## 2014-04-19 ENCOUNTER — Telehealth: Payer: Self-pay | Admitting: Internal Medicine

## 2014-04-19 MED ORDER — PIOGLITAZONE HCL 15 MG PO TABS: 15.0000 mg | ORAL_TABLET | Freq: Two times a day (BID) | ORAL | Status: DC

## 2014-04-19 MED ORDER — METFORMIN HCL 850 MG PO TABS: 850.0000 mg | ORAL_TABLET | Freq: Two times a day (BID) | ORAL | Status: DC

## 2014-04-19 NOTE — Telephone Encounter (Signed)
Pt needs actoplus separate due to cost. i will send in piogliazone 15mg  and then metformin 850mg . Pt does take both meds 1 tablet twice daily.

## 2014-04-28 ENCOUNTER — Telehealth: Payer: Self-pay | Admitting: Family Medicine

## 2014-04-30 ENCOUNTER — Other Ambulatory Visit: Payer: Self-pay | Admitting: Family Medicine

## 2014-04-30 NOTE — Telephone Encounter (Signed)
P.A. Pioglitazone approved 180/90 til 04/30/15, faxed pharmacy,  Left message for pt

## 2014-06-10 ENCOUNTER — Encounter: Payer: Self-pay | Admitting: Family Medicine

## 2014-06-10 ENCOUNTER — Ambulatory Visit (INDEPENDENT_AMBULATORY_CARE_PROVIDER_SITE_OTHER): Payer: Commercial Managed Care - HMO | Admitting: Family Medicine

## 2014-06-10 VITALS — BP 126/70 | HR 84 | Ht 62.0 in | Wt 256.0 lb

## 2014-06-10 DIAGNOSIS — I10 Essential (primary) hypertension: Secondary | ICD-10-CM

## 2014-06-10 DIAGNOSIS — E11319 Type 2 diabetes mellitus with unspecified diabetic retinopathy without macular edema: Secondary | ICD-10-CM

## 2014-06-10 DIAGNOSIS — E1122 Type 2 diabetes mellitus with diabetic chronic kidney disease: Secondary | ICD-10-CM

## 2014-06-10 DIAGNOSIS — Z6841 Body Mass Index (BMI) 40.0 and over, adult: Secondary | ICD-10-CM

## 2014-06-10 DIAGNOSIS — E7800 Pure hypercholesterolemia, unspecified: Secondary | ICD-10-CM

## 2014-06-10 DIAGNOSIS — N183 Chronic kidney disease, stage 3 unspecified: Secondary | ICD-10-CM

## 2014-06-10 DIAGNOSIS — E1322 Other specified diabetes mellitus with diabetic chronic kidney disease: Secondary | ICD-10-CM

## 2014-06-10 LAB — POCT GLYCOSYLATED HEMOGLOBIN (HGB A1C)

## 2014-06-10 NOTE — Progress Notes (Signed)
Chief Complaint  Patient presents with  . med check    med check, no other concerns   She is fasting today for labwork--she retired in July, so no longer gets bloodwork drawn through her job prior to visit (like she used to have done). Last labs were in 11/2013--LDL 67, TG slightly high at 167. A1c was 7.0.  Normal thyroid and vitamin D levels.  BUN/Cr 39/1.55.  Diabetes follow-up: She is checking her sugars in the mornings only, ranging 80's-90's, but this morning was 131 (had dessert last night).  She doesn't check it any other time of day. Denies hypoglycemia. Denies polydipsia and polyuria (voids frequently at night). Last eye exam was in Sept or October of 2015 (twice yearly); she has early retinopathy changes. Patient follows a low sugar diet and checks feet regularly.  She injured her left lateral foot while on a cruise. She had a sore that has taken a very long time to heal, but has been finally improving. At one point it drained, but is no longer draining or painful.  Hyperlipidemia follow-up: Patient is reportedly following a low-fat, low cholesterol diet. Compliant with medications and denies medication side effects.   Obesity: She goes to the senior center 5x/week--does exercise class and rides the bike. She doesn't drink juice or tea; drink diet ginger ale.  She eats out frequently, at [employer], 2x/wk.  She makes each plate last 2 meals.  She orders vegetables (greens, string beans, cabbage, stir fry vegetables, beets, lima beans and corn). She gets baked chicken or liver, but doesn't always get a meat.  She gets mashed potatoes without gravy, doesn't get bread. She also goes out to eat after church--[employer].  Gets steak, salad, baked potato. Takes home 1/2 the steak. Cereal for breakfast (multigrain Cheerios, and 2% milk) or oatmeal with fruitcup, sandwich for lunch (whole wheat bread).  Hypertension follow-up: Blood pressures elsewhere are not checked elsewhere.  Her machine broke and  she hasn't gotten another one. Denies dizziness, headaches, chest pain. Denies side effects of medications. She hasn't had any recent swelling, hasn't needed to wear compression stockings.  CKD--sees Dr. [name] yearly. No med changes were made.  She has pain in her right knee.  She was seeing ortho through [facility]  Surgery has been recommended, but she hasn't scheduled it yet. She had PT, but she didn't think it helped much.  She got her flu shot at [facility] [place], [place] and [facility] were reviewed and updated.  Outpatient Encounter Prescriptions as of 06/10/2014  Medication Sig  . amLODipine (NORVASC) 10 MG tablet Take 1 tablet (10 mg total) by mouth daily.  Marland Kitchen aspirin 81 MG tablet Take 81 mg by mouth daily.    Marland Kitchen atorvastatin (LIPITOR) 20 MG tablet TAKE 1 TABLET EVERY DAY  . Blood Glucose Monitoring Suppl (ACCU-CHEK NANO SMARTVIEW) W/DEVICE KIT USE AS DIRECTED (Patient taking differently: test once daily)  . Cholecalciferol (VITAMIN D) 1000 UNITS capsule Take 1,000 Units by mouth daily.    . enalapril (VASOTEC) 10 MG tablet TAKE 1 TABLET ONE TIME DAILY  . furosemide (LASIX) 40 MG tablet Take 40 mg by mouth daily.   Marland Kitchen LANTUS 100 UNIT/ML injection INJECT 0.25 MLS ( 25 UNITS TOTAL) INTO THE SKIN AT BEDTIME.  Marland Kitchen latanoprost (XALATAN) 0.005 % ophthalmic solution Place 1 drop into both eyes at bedtime.    . metFORMIN (GLUCOPHAGE) 850 MG tablet Take 1 tablet (850 mg total) by mouth 2 (two) times daily with a  meal.  . pioglitazone (ACTOS) 15 MG tablet TAKE 1 TABLET TWICE DAILY  . vitamin B-12 (CYANOCOBALAMIN) 100 MCG tablet Take 100 mcg by mouth daily.    . [DISCONTINUED] pioglitazone-metformin (ACTOPLUS MET) 15-850 MG per tablet TAKE ONE TABLET BY MOUTH TWICE DAILY WITH  A  MEAL   No Known Allergies  ROS: denies headaches, dizziness, chest pain, shortness of breath, palpitations, nausea, vomiting, bowel changes, URI symptoms, cough, bleeding, bruising, numbness, tingling, urinary complaints,  depression.  +knee pain. See HPI.  PHYSICAL EXAM: BP 158/90 mmHg  Pulse 84  Ht _0  (1.575 m)  Wt 256 lb (116.121 kg)  BMI 46.81 kg/m2 126/70 on repeat by MD with large cuff (nurse used regular, too small)  Pleasant, obese female in no distress  Neck: no lymphadenopathy, thyromegaly or mass, no carotid bruit Heart: regular rate and rhythm without murmur  Lungs: clear bilaterally  Abdomen: obese, soft, nontender, no mass  Extremities: Trace to 1+ pretibial edema bilaterally; 2+ pulses Left foot--on dorsal surface overlying 5th MT there is a healed wound (hypopigmented, but skin has completely healed). Nontender, no drainage, erythema. Neuro: alert and oriented.  Cranial nerves grossly intact.  Normal strength, gait Psych: normal mood, affect, hygiene and grooming  Lab Results  Component Value Date   HGBA1C 7.4% 06/10/2014   ASSESSMENT/PLAN:  Diabetes mellitus with stage 3 chronic kidney disease - above goal; discussed exercise, weight loss, checking sugars after meals. Hold off on med changes for now; f/u 3 mos - Plan: POCT glycosylated hemoglobin (Hb A1C), Comprehensive metabolic panel  Pure hypercholesterolemia - Plan: Lipid panel  Essential hypertension, benign - well controlled - Plan: Comprehensive metabolic panel  CKD (chronic kidney disease), stage III  Type 2 diabetes, controlled, with retinopathy - suboptimally controlled currently.  Given her complications, recommend trying to get A1c <7%  Morbid obesity with body mass index of 45.0-49.9 in adult - counseled extensively re: diet, exercise, portions, not skipping meals.  declined nutrition consult.  c-met and lipid today  Switch to skim milk. Try and cut back on potatoes (portion size vs substituting another vegetable).   Try and eat at least 3 times daily (ideally 3 small meals plus 2 small snacks). Skipping meals can slow your metabolism and make it harder to lose weight.  Declines nutrition consult.  3  month med check (nonfasting, A1c at visit). 6 month--CPE (must be separate from any med check)

## 2014-06-10 NOTE — Patient Instructions (Addendum)
  Switch to skim milk. Try and cut back on potatoes (portion size vs substituting another vegetable).   Try and eat at least 3 times daily (ideally 3 small meals plus 2 small snacks). Skipping meals can slow your metabolism and make it harder to lose weight.  Please consider consult with nutritionist.  Start checking your sugars twice daily (at least 2 hours after a meal for the second time). Please keep a log using columns for date/morning/evening. BRING THIS LIST to your next appointment.  You can mail it or call with your numbers if they are routinely very high or low prior to your 3 month appointment.  Goal is <120-130 in the morning, <140-160 after meals. If <70-80 in the mornings regularly, then also contact us.

## 2014-06-11 LAB — COMPREHENSIVE METABOLIC PANEL
ALT: 10 U/L (ref 0–35)
AST: 13 U/L (ref 0–37)
Albumin: 4.3 g/dL (ref 3.5–5.2)
Alkaline Phosphatase: 71 U/L (ref 39–117)
BUN: 27 mg/dL — ABNORMAL HIGH (ref 6–23)
CALCIUM: 9.8 mg/dL (ref 8.4–10.5)
CHLORIDE: 102 meq/L (ref 96–112)
CO2: 25 mEq/L (ref 19–32)
Creat: 1.39 mg/dL — ABNORMAL HIGH (ref 0.50–1.10)
GLUCOSE: 124 mg/dL — AB (ref 70–99)
Potassium: 4.6 mEq/L (ref 3.5–5.3)
Sodium: 140 mEq/L (ref 135–145)
TOTAL PROTEIN: 7.1 g/dL (ref 6.0–8.3)
Total Bilirubin: 0.4 mg/dL (ref 0.2–1.2)

## 2014-06-11 LAB — LIPID PANEL
Cholesterol: 161 mg/dL (ref 0–200)
HDL: 44 mg/dL (ref >39)
LDL CALC: 85 mg/dL (ref 0–99)
Total CHOL/HDL Ratio: 3.7 Ratio
Triglycerides: 159 mg/dL — ABNORMAL HIGH (ref <150)
VLDL: 32 mg/dL (ref 0–40)

## 2014-06-13 ENCOUNTER — Encounter: Payer: Self-pay | Admitting: Family Medicine

## 2014-07-08 ENCOUNTER — Telehealth: Payer: Self-pay | Admitting: Internal Medicine

## 2014-07-08 MED ORDER — METFORMIN HCL 850 MG PO TABS
850.0000 mg | ORAL_TABLET | Freq: Two times a day (BID) | ORAL | Status: DC
Start: 2014-07-08 — End: 2014-09-30

## 2014-07-08 MED ORDER — PIOGLITAZONE HCL 15 MG PO TABS: 15.0000 mg | ORAL_TABLET | Freq: Two times a day (BID) | ORAL | Status: DC

## 2014-07-08 NOTE — Telephone Encounter (Signed)
Refill request for pioglitazone hcl 15mg , metformin 850mg  to [facility]

## 2014-07-08 NOTE — Telephone Encounter (Signed)
Done

## 2014-09-11 ENCOUNTER — Telehealth: Payer: Self-pay | Admitting: Family Medicine

## 2014-09-11 NOTE — Telephone Encounter (Signed)
Pt needs referral to her eye Dr Dione BoozeGroat.  She now has [facility] and they would not see her without the referral.  Please call pt when done so she can reschedule  Pt ph [phone]

## 2014-09-11 NOTE — Telephone Encounter (Signed)
[facility] and she gave me the info for the referral and will call pt to get her appt scheduled  Dr. Ernesto Rutherfordobert Groat NPI- [id number] Francine Graven[facility] [id number] Dx Code-[id number]  Referral has been done and they can see online the referral

## 2014-09-16 ENCOUNTER — Other Ambulatory Visit: Payer: Self-pay | Admitting: Family Medicine

## 2014-09-30 ENCOUNTER — Ambulatory Visit (INDEPENDENT_AMBULATORY_CARE_PROVIDER_SITE_OTHER): Payer: Commercial Managed Care - HMO | Admitting: Family Medicine

## 2014-09-30 ENCOUNTER — Encounter: Payer: Self-pay | Admitting: Family Medicine

## 2014-09-30 VITALS — BP 124/68 | HR 84 | Ht 62.0 in | Wt 265.4 lb

## 2014-09-30 DIAGNOSIS — N181 Chronic kidney disease, stage 1: Secondary | ICD-10-CM | POA: Diagnosis not present

## 2014-09-30 DIAGNOSIS — I129 Hypertensive chronic kidney disease with stage 1 through stage 4 chronic kidney disease, or unspecified chronic kidney disease: Secondary | ICD-10-CM | POA: Diagnosis not present

## 2014-09-30 DIAGNOSIS — E1322 Other specified diabetes mellitus with diabetic chronic kidney disease: Secondary | ICD-10-CM | POA: Diagnosis not present

## 2014-09-30 DIAGNOSIS — E7800 Pure hypercholesterolemia, unspecified: Secondary | ICD-10-CM

## 2014-09-30 DIAGNOSIS — Z6841 Body Mass Index (BMI) 40.0 and over, adult: Secondary | ICD-10-CM | POA: Diagnosis not present

## 2014-09-30 DIAGNOSIS — N185 Chronic kidney disease, stage 5: Secondary | ICD-10-CM

## 2014-09-30 DIAGNOSIS — N182 Chronic kidney disease, stage 2 (mild): Secondary | ICD-10-CM

## 2014-09-30 DIAGNOSIS — N184 Chronic kidney disease, stage 4 (severe): Secondary | ICD-10-CM | POA: Diagnosis not present

## 2014-09-30 DIAGNOSIS — N183 Chronic kidney disease, stage 3 unspecified: Secondary | ICD-10-CM

## 2014-09-30 DIAGNOSIS — E11319 Type 2 diabetes mellitus with unspecified diabetic retinopathy without macular edema: Secondary | ICD-10-CM | POA: Diagnosis not present

## 2014-09-30 DIAGNOSIS — I10 Essential (primary) hypertension: Secondary | ICD-10-CM

## 2014-09-30 DIAGNOSIS — E1129 Type 2 diabetes mellitus with other diabetic kidney complication: Secondary | ICD-10-CM | POA: Diagnosis not present

## 2014-09-30 DIAGNOSIS — E1122 Type 2 diabetes mellitus with diabetic chronic kidney disease: Secondary | ICD-10-CM

## 2014-09-30 DIAGNOSIS — E119 Type 2 diabetes mellitus without complications: Secondary | ICD-10-CM | POA: Diagnosis not present

## 2014-09-30 DIAGNOSIS — N189 Chronic kidney disease, unspecified: Secondary | ICD-10-CM

## 2014-09-30 DIAGNOSIS — E782 Mixed hyperlipidemia: Secondary | ICD-10-CM | POA: Diagnosis not present

## 2014-09-30 LAB — TSH: TSH: 2.338 u[IU]/mL (ref 0.350–4.500)

## 2014-09-30 LAB — CBC WITH DIFFERENTIAL/PLATELET
BASOS ABS: 0.1 10*3/uL (ref 0.0–0.1)
BASOS PCT: 1 % (ref 0–1)
EOS PCT: 4 % (ref 0–5)
Eosinophils Absolute: 0.3 10*3/uL (ref 0.0–0.7)
HCT: 38.5 % (ref 36.0–46.0)
HEMOGLOBIN: 12 g/dL (ref 12.0–15.0)
Lymphocytes Relative: 27 % (ref 12–46)
Lymphs Abs: 2 10*3/uL (ref 0.7–4.0)
MCH: 26.7 pg (ref 26.0–34.0)
MCHC: 31.2 g/dL (ref 30.0–36.0)
MCV: 85.7 fL (ref 78.0–100.0)
MPV: 10.9 fL (ref 8.6–12.4)
Monocytes Absolute: 0.4 10*3/uL (ref 0.1–1.0)
Monocytes Relative: 6 % (ref 3–12)
NEUTROS PCT: 62 % (ref 43–77)
Neutro Abs: 4.6 10*3/uL (ref 1.7–7.7)
PLATELETS: 306 10*3/uL (ref 150–400)
RBC: 4.49 MIL/uL (ref 3.87–5.11)
RDW: 15 % (ref 11.5–15.5)
WBC: 7.4 10*3/uL (ref 4.0–10.5)

## 2014-09-30 LAB — COMPREHENSIVE METABOLIC PANEL
ALT: 11 U/L (ref 0–35)
AST: 11 U/L (ref 0–37)
Albumin: 4.1 g/dL (ref 3.5–5.2)
Alkaline Phosphatase: 69 U/L (ref 39–117)
BUN: 36 mg/dL — AB (ref 6–23)
CO2: 26 meq/L (ref 19–32)
Calcium: 9.1 mg/dL (ref 8.4–10.5)
Chloride: 106 mEq/L (ref 96–112)
Creat: 1.3 mg/dL — ABNORMAL HIGH (ref 0.50–1.10)
Glucose, Bld: 110 mg/dL — ABNORMAL HIGH (ref 70–99)
Potassium: 4.5 mEq/L (ref 3.5–5.3)
SODIUM: 142 meq/L (ref 135–145)
Total Bilirubin: 0.3 mg/dL (ref 0.2–1.2)
Total Protein: 6.9 g/dL (ref 6.0–8.3)

## 2014-09-30 LAB — LIPID PANEL
Cholesterol: 163 mg/dL (ref 0–200)
HDL: 48 mg/dL (ref >=46)
LDL Cholesterol: 92 mg/dL (ref 0–99)
Total CHOL/HDL Ratio: 3.4 Ratio
Triglycerides: 113 mg/dL (ref <150)
VLDL: 23 mg/dL (ref 0–40)

## 2014-09-30 LAB — POCT GLYCOSYLATED HEMOGLOBIN (HGB A1C): HEMOGLOBIN A1C: 6.1

## 2014-09-30 MED ORDER — METFORMIN HCL 850 MG PO TABS: 850.0000 mg | ORAL_TABLET | Freq: Two times a day (BID) | ORAL | Status: DC

## 2014-09-30 MED ORDER — PIOGLITAZONE HCL 15 MG PO TABS: 15.0000 mg | ORAL_TABLET | Freq: Two times a day (BID) | ORAL | Status: DC

## 2014-09-30 NOTE — Patient Instructions (Addendum)
  I'm worried about the potential for low blood sugars during the day (when you aren't checking)--most likely before lunch, since you are skipping breakfast.  Please try and spread out your meals better--eat something in the morning. Try and get regular exercise--your work schedule is tough, so try and get 10-15 minutes in the morning before work, and 15-20 minutes on your lunch break, and/or after work.  Goal is to get 150 minutes of aerobic exercise each week, aiming for 30 minutes 5x/week, even if that 30 minutes is split up into 10-15 minute intervals.   We can change your pioglitazone to 30mg  once daily rather than 15mg  twice daily.  I'm happy to leave it the same as long as insurance covers it and cost isn't a problem. If it is less expensive to change it, consider that for next prescriptions.

## 2014-09-30 NOTE — Progress Notes (Signed)
Chief Complaint  Patient presents with  . Diabetes    fasting med check. Patient mentioned to me that she is checking her blood sugar ~3 times consecutively in the morning and the numbers can be 20 points different.   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  Works 10am to 7pm five days/week.  She brings lunch.  Diabetes follow-up: She is checking her sugars twice daily.  They are running 93-136 in the mornings, mostly 110-120), 96-176 in the evenings, mostly 120-150. Denies hypoglycemia. Denies polydipsia and polyuria (voids frequently at night). Last eye exam was 6 months ago, has appt later this month; she has early retinopathy changes. Patient follows a low sugar diet and checks feet regularly.   Hyperlipidemia follow-up: Patient is reportedly following a low-fat, low cholesterol diet. Compliant with medications and denies medication side effects.   Obesity: She [facility] to [facility] the [facility]--does exercise class and rides the bike--she stopped doing this once she started working again.   Not currently getting any exercise..  She continues to drink 2% milk.  She still skips breakfast.  Hypertension follow-up: Blood pressures elsewhere are not checked elsewhere. Denies dizziness, headaches, chest pain. Denies side effects of medications. She hasn't had any recent swelling, hasn't needed to wear compression stockings.  CKD--sees Dr. [name] yearly. No med changes were made.  She has pain in her right knee. Pain is intermittent now, and she prefers to hold off on the surgery that was recommended.  PMH, PSH, SH and FH were reviewed and updated. Outpatient Encounter Prescriptions as of 09/30/2014  Medication Sig Note  . amLODipine (NORVASC) 10 MG tablet Take 1 tablet (10 mg total) by mouth daily.   Marland Kitchen aspirin 81 MG tablet Take 81 mg by mouth daily.     Marland Kitchen atorvastatin (LIPITOR) 20 MG tablet TAKE 1 TABLET EVERY DAY   . Blood Glucose  Monitoring Suppl (ACCU-CHEK NANO SMARTVIEW) W/DEVICE KIT USE AS DIRECTED (Patient taking differently: test once daily)   . Cholecalciferol (VITAMIN D) 1000 UNITS capsule Take 1,000 Units by mouth daily.     . enalapril (VASOTEC) 10 MG tablet TAKE 1 TABLET ONE TIME DAILY   . furosemide (LASIX) 40 MG tablet Take 40 mg by mouth daily.    Marland Kitchen LANTUS 100 UNIT/ML injection INJECT 25 UNITS INTO THE SKIN AT BEDTIME   . latanoprost (XALATAN) 0.005 % ophthalmic solution Place 1 drop into both eyes at bedtime.     . metFORMIN (GLUCOPHAGE) 850 MG tablet Take 1 tablet (850 mg total) by mouth 2 (two) times daily with a meal.   . pioglitazone (ACTOS) 15 MG tablet Take 1 tablet (15 mg total) by mouth 2 (two) times daily. 09/30/2014: Changed from actoplusmet--prefers to stay on the BID rather than change to 51m qd  . vitamin B-12 (CYANOCOBALAMIN) 100 MCG tablet Take 100 mcg by mouth daily.      No Known Allergies  ROS: Almost 10# weight gain since her last visit 4 months ago. Denies fevers, chills, URI or allergy symptoms, cough, shortness of breath, edema, muscle cramps, nausea, vomiting, diarrhea, urinary complaints.  +right knee pain intermittently.  PHYSICAL EXAM: BP 124/68 mmHg  Pulse 84  Ht 5' 2" (1.575 m)  Wt 265 lb 6.4 oz (120.385 kg)  BMI 48.53 kg/m2  Pleasant, obese female in no distress  Neck: no lymphadenopathy, thyromegaly or mass, no carotid bruit Heart: regular rate and rhythm without murmur  Lungs: clear bilaterally  Abdomen: obese, soft, nontender, no mass  Extremities: Trace to 1+ pretibial edema bilaterally; 2+ pulses onychomycotic right great toenail; normal monofilament testing. Neuro: alert and oriented. Cranial nerves grossly intact. Normal strength, gait Psych: normal mood, affect, hygiene and grooming  Lab Results  Component Value Date   HGBA1C 6.1 09/30/2014   ASSESSMENT/PLAN:  Diabetes mellitus with stage 3 chronic kidney disease - Plan: Comprehensive metabolic  panel, CBC with Differential/Platelet, TSH  Diabetes mellitus without complication - Plan: HgB A1c  Pure hypercholesterolemia  Essential hypertension, benign - controlled on current regimen - Plan: Comprehensive metabolic panel  CKD (chronic kidney disease), stage III  Mixed hyperlipidemia - Plan: Comprehensive metabolic panel, Lipid panel  Type 2 diabetes, controlled, with retinopathy - Plan: pioglitazone (ACTOS) 15 MG tablet, metFORMIN (GLUCOPHAGE) 850 MG tablet  Hypertensive nephropathy, stage 1-4 or unspecified chronic kidney disease - Plan: Comprehensive metabolic panel  Morbid obesity with body mass index of 45.0-49.9 in adult   I'm worried about the potential for low blood sugars during the day (when you aren't checking)--most likely before lunch, since you are skipping breakfast.  Please try and spread out your meals better--eat something in the morning. Try and get regular exercise--your work schedule is tough, so try and get 10-15 minutes in the morning before work, and 15-20 minutes on your lunch break, and/or after work.  Goal is to get 150 minutes of aerobic exercise each week, aiming for 30 minutes 5x/week, even if that 30 minutes is split up into 10-15 minute intervals.   c-met, lipid, TSH CPE (after 6/29)--can be on cancellation list  6 months med check (need to be separate from CPE)

## 2014-10-01 ENCOUNTER — Encounter: Payer: Self-pay | Admitting: Family Medicine

## 2014-10-09 DIAGNOSIS — H4011X1 Primary open-angle glaucoma, mild stage: Secondary | ICD-10-CM | POA: Diagnosis not present

## 2014-10-09 DIAGNOSIS — Z961 Presence of intraocular lens: Secondary | ICD-10-CM | POA: Diagnosis not present

## 2014-10-09 DIAGNOSIS — E119 Type 2 diabetes mellitus without complications: Secondary | ICD-10-CM | POA: Diagnosis not present

## 2014-10-09 DIAGNOSIS — H04123 Dry eye syndrome of bilateral lacrimal glands: Secondary | ICD-10-CM | POA: Diagnosis not present

## 2014-10-09 LAB — HM DIABETES EYE EXAM

## 2014-10-17 ENCOUNTER — Encounter: Payer: Self-pay | Admitting: Internal Medicine

## 2014-10-30 ENCOUNTER — Other Ambulatory Visit: Payer: Self-pay | Admitting: Family Medicine

## 2014-11-14 ENCOUNTER — Other Ambulatory Visit: Payer: Self-pay | Admitting: Family Medicine

## 2014-11-21 ENCOUNTER — Other Ambulatory Visit: Payer: Self-pay | Admitting: Family Medicine

## 2015-01-16 ENCOUNTER — Other Ambulatory Visit: Payer: Self-pay | Admitting: Family Medicine

## 2015-01-22 ENCOUNTER — Other Ambulatory Visit: Payer: Self-pay | Admitting: Family Medicine

## 2015-02-20 ENCOUNTER — Ambulatory Visit
Admission: RE | Admit: 2015-02-20 | Discharge: 2015-02-20 | Disposition: A | Discharge status: Home / Self Care | Payer: Medicare HMO | Source: Ambulatory Visit | Attending: Family Medicine | Admitting: Family Medicine

## 2015-02-20 ENCOUNTER — Encounter: Payer: Self-pay | Admitting: Family Medicine

## 2015-02-20 ENCOUNTER — Ambulatory Visit (INDEPENDENT_AMBULATORY_CARE_PROVIDER_SITE_OTHER): Payer: Commercial Managed Care - HMO | Admitting: Family Medicine

## 2015-02-20 VITALS — BP 138/70 | HR 84 | Ht 62.0 in | Wt 275.0 lb

## 2015-02-20 DIAGNOSIS — M79671 Pain in right foot: Secondary | ICD-10-CM

## 2015-02-20 DIAGNOSIS — M7731 Calcaneal spur, right foot: Secondary | ICD-10-CM | POA: Diagnosis not present

## 2015-02-20 DIAGNOSIS — Z23 Encounter for immunization: Secondary | ICD-10-CM

## 2015-02-20 NOTE — Progress Notes (Signed)
Chief Complaint  Patient presents with  . Foot Pain    right foot x 10 days. Ran out of asa 23m 3 weeks ago, wants to know if this could have anything to do with the foot pain.     She complains of pain in her right great toe.  She has had this in the past, but it usually only lasts 1-2 days, and this time it wasn't going away on its own. It has been hurting for about 10 days.  There has been some swelling.  She has previously used voltaren gel along with "some pills". She no longer has the tablets, but the topical voltaren hasn't been helping.  She has also been taking tylenol, which hasn't really helped.  Pain is 8/10 currently, sometimes will get a shooting pain that is even stronger.  It started off only hurting with walking, now having some pain with sitting too.  Pain comes and goes during the day.  It doesn't keep her awake at night, but hurts when she gets out of bed to go to the bathroom. Of note, she ran out of her 825maspirin about 3 weeks ago.  She denies any trauma or injury, change in shoes, or other known cause of discomfort.  PMH, PSH, SH reviewed.  Outpatient Encounter Prescriptions as of 02/20/2015  Medication Sig  . amLODipine (NORVASC) 10 MG tablet Take 1 tablet (10 mg total) by mouth daily.  . Marland Kitchentorvastatin (LIPITOR) 20 MG tablet TAKE 1 TABLET EVERY DAY  . Blood Glucose Monitoring Suppl (ACCU-CHEK NANO SMARTVIEW) W/DEVICE KIT USE AS DIRECTED (Patient taking differently: test once daily)  . Cholecalciferol (VITAMIN D) 1000 UNITS capsule Take 1,000 Units by mouth daily.    . enalapril (VASOTEC) 10 MG tablet TAKE 1 TABLET EVERY DAY  . furosemide (LASIX) 40 MG tablet Take 40 mg by mouth daily.   . Marland KitchenANTUS 100 UNIT/ML injection INJECT  25 UNITS SUBCUTANEOUSLY AT BEDTIME  . latanoprost (XALATAN) 0.005 % ophthalmic solution Place 1 drop into both eyes at bedtime.    . metFORMIN (GLUCOPHAGE) 850 MG tablet Take 1 tablet (850 mg total) by mouth 2 (two) times daily with a meal.  .  pioglitazone (ACTOS) 15 MG tablet Take 1 tablet (15 mg total) by mouth 2 (two) times daily.  . vitamin B-12 (CYANOCOBALAMIN) 100 MCG tablet Take 100 mcg by mouth daily.    . Marland Kitchenspirin 81 MG tablet Take 81 mg by mouth daily.     No facility-administered encounter medications on file as of 02/20/2015.   No Known Allergies  ROS: no fever, chills, headaches, dizziness, bleeding, bruising, rash, abdominal pain, chest pain, shortness of breath or other concerns, except as noted in HPI  PHYSICAL EXAM: BP 138/70 mmHg  Pulse 84  Ht '5\' 2"'  (1.575 m)  Wt 275 lb (124.739 kg)  BMI 50.29 kg/m2 Well developed, pleasant, obese female in no acute distress.  Right eye periodically was watering/tearing (denied crying from pain--reports sometimes the right eye just leaks) Right foot:  2+ pulse No erythema, warmth or significant swelling noted. She is tender along the medial aspect of the first MTP. No pain with flexion/extension of the great toe.  ASSESSMENT/PLAN:  Right foot pain - Plan: DG Foot Complete Right  Need for prophylactic vaccination and inoculation against influenza - Plan: Flu vaccine HIGH DOSE PF (Fluzone High dose)   The possible diagnoses for your toe pain include arthritis and gout. Restart your aspirin 8181mnce daily--I think you should take it  twice daily for the next few days, and then go back to just once daily.  Use tylenol arthritis during the day if needed for additional pain relief.  Continue the voltaren gel topically. You may also try icing the foot/toe; keeping it elevated should also help decrease any throbbing pain. Go to [facility] for an x-ray--this might help [name] differentiate plain osteoarthritis vs gout.   We will do blood tests at your physical next week also to assess the possibility of gout.  Gout is usually a chronic problem, and certain foods can trigger flares. If we think it is gout, we will provide you with more information regarding diet and other  options for medications.  Due to your kidney disease, we don't want you taking anti-inflammatories regularly (ie ibuprofen/aleve).  Prednisone would also be helpful for a gout flare, but that would make your sugars very high.  Colchicine is a gout medication that would be a good option, if it is truly gout (to use as needed for flares of pain), however at this point I'm not convinced it is gout.  It won't help with arthritis pain at all.   F/u as scheduled Monday for CPE

## 2015-02-20 NOTE — Patient Instructions (Signed)
  The possible diagnoses for your toe pain include arthritis and gout. Restart your aspirin  once daily--I think you should take it twice daily for the next few days, and then go back to just once daily.  Use tylenol arthritis during the day if needed for additional pain relief.  Continue the voltaren gel topically. You may also try icing the foot/toe; keeping it elevated should also help decrease any throbbing pain. Go to [facility] for an x-ray--this might help [name] differentiate plain osteoarthritis vs gout.   We will do blood tests at your physical next week also to assess the possibility of gout.  Gout is usually a chronic problem, and certain foods can trigger flares. If we think it is gout, we will provide you with more information regarding diet and other options for medications.  Due to your kidney disease, we don't want you taking anti-inflammatories regularly (ie ibuprofen/aleve).  Prednisone would also be helpful for a gout flare, but that would make your sugars very high.  Colchicine is a gout medication that would be a good option, if it is truly gout (to use as needed for flares of pain), however at this point I'm not convinced it is gout.  It won't help with arthritis pain at all.

## 2015-02-24 ENCOUNTER — Other Ambulatory Visit (HOSPITAL_COMMUNITY)
Admission: RE | Admit: 2015-02-24 | Discharge: 2015-02-24 | Disposition: A | Discharge status: Home / Self Care | Payer: Commercial Managed Care - HMO | Source: Ambulatory Visit | Attending: Family Medicine | Admitting: Family Medicine

## 2015-02-24 ENCOUNTER — Encounter: Payer: Self-pay | Admitting: Family Medicine

## 2015-02-24 ENCOUNTER — Ambulatory Visit (INDEPENDENT_AMBULATORY_CARE_PROVIDER_SITE_OTHER): Payer: Commercial Managed Care - HMO | Admitting: Family Medicine

## 2015-02-24 VITALS — BP 122/70 | HR 92 | Ht 62.0 in | Wt 274.0 lb

## 2015-02-24 DIAGNOSIS — Z6841 Body Mass Index (BMI) 40.0 and over, adult: Secondary | ICD-10-CM

## 2015-02-24 DIAGNOSIS — E1129 Type 2 diabetes mellitus with other diabetic kidney complication: Secondary | ICD-10-CM | POA: Diagnosis not present

## 2015-02-24 DIAGNOSIS — E1322 Other specified diabetes mellitus with diabetic chronic kidney disease: Secondary | ICD-10-CM | POA: Diagnosis not present

## 2015-02-24 DIAGNOSIS — B977 Papillomavirus as the cause of diseases classified elsewhere: Secondary | ICD-10-CM

## 2015-02-24 DIAGNOSIS — E7800 Pure hypercholesterolemia, unspecified: Secondary | ICD-10-CM

## 2015-02-24 DIAGNOSIS — R8789 Other abnormal findings in specimens from female genital organs: Secondary | ICD-10-CM | POA: Diagnosis not present

## 2015-02-24 DIAGNOSIS — Z01419 Encounter for gynecological examination (general) (routine) without abnormal findings: Secondary | ICD-10-CM

## 2015-02-24 DIAGNOSIS — N183 Chronic kidney disease, stage 3 unspecified: Secondary | ICD-10-CM

## 2015-02-24 DIAGNOSIS — Z0000 Encounter for general adult medical examination without abnormal findings: Secondary | ICD-10-CM

## 2015-02-24 DIAGNOSIS — Z124 Encounter for screening for malignant neoplasm of cervix: Secondary | ICD-10-CM | POA: Diagnosis not present

## 2015-02-24 DIAGNOSIS — E1122 Type 2 diabetes mellitus with diabetic chronic kidney disease: Secondary | ICD-10-CM

## 2015-02-24 DIAGNOSIS — M79609 Pain in unspecified limb: Secondary | ICD-10-CM | POA: Diagnosis not present

## 2015-02-24 DIAGNOSIS — R87810 Cervical high risk human papillomavirus (HPV) DNA test positive: Secondary | ICD-10-CM | POA: Diagnosis not present

## 2015-02-24 DIAGNOSIS — E11319 Type 2 diabetes mellitus with unspecified diabetic retinopathy without macular edema: Secondary | ICD-10-CM | POA: Diagnosis not present

## 2015-02-24 DIAGNOSIS — Z1151 Encounter for screening for human papillomavirus (HPV): Secondary | ICD-10-CM | POA: Insufficient documentation

## 2015-02-24 DIAGNOSIS — M79674 Pain in right toe(s): Secondary | ICD-10-CM | POA: Diagnosis not present

## 2015-02-24 DIAGNOSIS — I10 Essential (primary) hypertension: Secondary | ICD-10-CM

## 2015-02-24 DIAGNOSIS — E119 Type 2 diabetes mellitus without complications: Secondary | ICD-10-CM | POA: Diagnosis not present

## 2015-02-24 DIAGNOSIS — IMO0002 Reserved for concepts with insufficient information to code with codable children: Secondary | ICD-10-CM

## 2015-02-24 LAB — LIPID PANEL
CHOLESTEROL: 149 mg/dL (ref 125–200)
HDL: 41 mg/dL — ABNORMAL LOW (ref >=46)
LDL CALC: 80 mg/dL (ref <130)
TRIGLYCERIDES: 142 mg/dL (ref <150)
Total CHOL/HDL Ratio: 3.6 Ratio (ref <=5.0)
VLDL: 28 mg/dL (ref <30)

## 2015-02-24 LAB — COMPREHENSIVE METABOLIC PANEL
ALT: 10 U/L (ref 6–29)
AST: 11 U/L (ref 10–35)
Albumin: 3.9 g/dL (ref 3.6–5.1)
Alkaline Phosphatase: 63 U/L (ref 33–130)
BUN: 39 mg/dL — ABNORMAL HIGH (ref 7–25)
CO2: 25 mmol/L (ref 20–31)
Calcium: 9 mg/dL (ref 8.6–10.4)
Chloride: 104 mmol/L (ref 98–110)
Creat: 1.79 mg/dL — ABNORMAL HIGH (ref 0.60–0.93)
Glucose, Bld: 86 mg/dL (ref 65–99)
POTASSIUM: 4.4 mmol/L (ref 3.5–5.3)
Sodium: 141 mmol/L (ref 135–146)
TOTAL PROTEIN: 6.9 g/dL (ref 6.1–8.1)
Total Bilirubin: 0.5 mg/dL (ref 0.2–1.2)

## 2015-02-24 LAB — URIC ACID: Uric Acid, Serum: 10.8 mg/dL — ABNORMAL HIGH (ref 2.4–7.0)

## 2015-02-24 LAB — POCT GLYCOSYLATED HEMOGLOBIN (HGB A1C): HEMOGLOBIN A1C: 6.1

## 2015-02-24 MED ORDER — METFORMIN HCL 850 MG PO TABS: 850.0000 mg | ORAL_TABLET | Freq: Two times a day (BID) | ORAL | Status: DC

## 2015-02-24 MED ORDER — PIOGLITAZONE HCL 15 MG PO TABS
15.0000 mg | ORAL_TABLET | Freq: Two times a day (BID) | ORAL | Status: DC
Start: 2015-02-24 — End: 2015-08-05

## 2015-02-24 NOTE — Patient Instructions (Addendum)
HEALTH MAINTENANCE RECOMMENDATIONS:  It is recommended that you get at least 30 minutes of aerobic exercise at least 5 days/week (for weight loss, you may need as much as 60-90 minutes). This can be any activity that gets your heart rate up. This can be divided in 10-15 minute intervals if needed, but try and build up your endurance at least once a week.  Weight bearing exercise is also recommended twice weekly.  Eat a healthy diet with lots of vegetables, fruits and fiber.  "Colorful" foods have a lot of vitamins (ie green vegetables, tomatoes, red peppers, etc).  Limit sweet tea, regular sodas and alcoholic beverages, all of which has a lot of calories and sugar.  Up to 1 alcoholic drink daily may be beneficial for women (unless trying to lose weight, watch sugars).  Drink a lot of water.  Calcium recommendations are 1200-1500 mg daily (1500 mg for postmenopausal women or women without ovaries), and vitamin D 1000 IU daily.  This should be obtained from diet and/or supplements (vitamins), and calcium should not be taken all at once, but in divided doses.  Monthly self breast exams and yearly mammograms for women over the age of 73 is recommended.  Sunscreen of at least SPF 30 should be used on all sun-exposed parts of the skin when outside between the hours of 10 am and 4 pm (not just when at beach or pool, but even with exercise, golf, tennis, and yard work!)  Use a sunscreen that says "broad spectrum" so it covers both UVA and UVB rays, and make sure to reapply every 1-2 hours.  Remember to change the batteries in your smoke detectors when changing your clock times in the spring and fall.  Use your seat belt every time you are in a car, and please drive safely and not be distracted with cell phones and texting while driving.  Call to schedule your mammogram. Try and increase your exercise to 5 days/week--[facility] the days you don't have [facility] aerobics.  You have  gained 10-15 pounds in the last year, and this is going to make your knee pain worse--it is very important to try and lose weight.  Most of this needs to come from watching your diet--eating healthy, small portions, and not skipping meals.    Kerri Richardson , Thank you for taking time to come for your Medicare Wellness Visit. I appreciate your ongoing commitment to your health goals. Please review the following plan we discussed and let me know if I can assist you in the future.   These are the goals we discussed: Goals    None      This is a list of the screening recommended for you and due dates:  Health Maintenance  Topic Date Due  . Urine Protein Check  12/11/2014  . Hemoglobin A1C  04/01/2015  . Complete foot exam   09/30/2015  . Eye exam for diabetics  10/09/2015  . Mammogram  12/20/2015  . Flu Shot  01/13/2016  . Tetanus Vaccine  10/18/2017  . Colon Cancer Screening  11/27/2023  . DEXA scan (bone density measurement)  Completed  . Shingles Vaccine  Completed  . Pneumonia vaccines  Completed   (it says mammo is due in 2017 based on an every TWO year recommendation--I prefer you to get YEARLY mammograms, so please schedule). The date above for your next colon cancer screening is incorrect.  You need one FIVE years from the last,  not ten, as is indicated above.  You will be due again for colonoscopy in June 2020 Urine check was done today.  We are referring you to a nutritionist.  Please keep a journal of your food and exercise for at least 5-7 days prior to your visit with her.

## 2015-02-24 NOTE — Progress Notes (Signed)
Chief Complaint  Patient presents with  . Med check plus    AWV with pelvic, had pap here 11/2013. Only complaint is that her foot is not doing any better, still having pain. R foot.    Kerri Richardson is a 74 y.o. female who presents for annual wellness visit and follow-up on chronic medical conditions.  She has the following concerns:  She was seen last week with right foot pain. She has been taking the 63m of aspirin twice daily since her visit last week.  Occasionally she gets a sharp pain, and she still has pain with walking.  X-ray showed mild degenerative changes of the right first MTP.  Diabetes follow-up: She is checking her sugars just once daily, running around 120 each morning. Denies hypoglycemia. Denies polydipsia and polyuria (voids frequently at night). Last eye exam was within the year (April).  Patient follows a low sugar diet and checks feet regularly.   Hyperlipidemia follow-up: Patient is reportedly following a low-fat, low cholesterol diet. Compliant with medications and denies medication side effects.   Obesity: She goes to water aerobics 3x/week.  [facility]  Hypertension follow-up: Blood pressure is not checked elsewhere. Denies dizziness, headaches, chest pain. Denies side effects of medications. She hasn't had any recent swelling, hasn't needed to wear compression stockings.  CKD--sees Dr. PFlorene Glenyearly. No med changes were made.  Immunization History  Administered Date(s) Administered  . Influenza Split 05/05/2011, 03/23/2012  . Influenza, High Dose Seasonal PF 04/03/2014, 02/20/2015  . Pneumococcal Conjugate-13 12/10/2013  . Pneumococcal Polysaccharide-23 01/26/2010  . Tdap 10/19/2007  . Zoster 10/19/2007   Last Pap smear: 11/2013, normal but high risk HPV was noted Last mammogram: 12/2013 Last colonoscopy: 11/26/13, +polyp, due again in 5 years Last DEXA:  01/2010--normal Ophtho:Jan or Feb 2016 (+ retinopathy) Dentist: has dentures, and just one tooth; last seen 02/2013 (got new dentures) Exercise: water aerobics 3x/week.  Other doctors caring for patient include: Ophtho: Dr. GKaty FitchNephro: Dr. PFlorene GlenGI: Dr. MCollene MaresOrtho: Dr. BTonita Cong  Depression screen:  Negative (see epic questionnaire) Fall screen: negative ADL screen:  Notable for trouble with stairs due to right knee pain.  (see epic questionnaire)  End of Life Discussion:  Patient does not have a living will and medical power of attorney  Past Medical History  Diagnosis Date  . Type II or unspecified type diabetes mellitus without mention of complication, not stated as uncontrolled   . Pure hypercholesterolemia   . Essential hypertension, benign   . Glaucoma   . Diabetic retinopathy   . CKD (chronic kidney disease) stage 3, GFR 30-59 ml/min     Dr. PFlorene Glen . DM retinopathy 08/17/12    early    Past Surgical History  Procedure Laterality Date  . Cataract extraction, bilateral  2012    Dr. GKaty Fitch . Colonoscopy  3/05, 11/2013    due again 2020  . Tonsillectomy  age 89 . Brain surgery  1969    Burr holes in skull to alleviate pressure on the brain (no shunt)    Social History   Social History  . Marital Status: Single    Spouse Name: N/A  . Number of Children: N/A  . Years of Education: N/A   Occupational History  . Not on file.   Social History Main Topics  . Smoking status: Former Smoker    Quit date: 06/14/2000  .  Smokeless tobacco: Never Used  . Alcohol Use: No  . Drug Use: No  . Sexual Activity: Not Currently   Other Topics Concern  . Not on file   Social History Narrative   Worked at [employer], retired 12/2013. Lives alone.  She has a sister in [place].  No children    Family History  Problem Relation Age of Onset  . Hypertension Mother   . Cancer Mother     lung (nonsmoker)  . Stroke Father   . Hypertension Father   . Diabetes Father   .  Hypertension Sister   . Diabetes Sister   . Heart disease Sister   . Hypertension Sister     Outpatient Encounter Prescriptions as of 02/24/2015  Medication Sig  . amLODipine (NORVASC) 10 MG tablet Take 1 tablet (10 mg total) by mouth daily.  Marland Kitchen aspirin 81 MG tablet Take 81 mg by mouth daily.    Marland Kitchen atorvastatin (LIPITOR) 20 MG tablet TAKE 1 TABLET EVERY DAY  . Blood Glucose Monitoring Suppl (ACCU-CHEK NANO SMARTVIEW) W/DEVICE KIT USE AS DIRECTED (Patient taking differently: test once daily)  . Cholecalciferol (VITAMIN D) 1000 UNITS capsule Take 1,000 Units by mouth daily.    . enalapril (VASOTEC) 10 MG tablet TAKE 1 TABLET EVERY DAY  . furosemide (LASIX) 40 MG tablet Take 40 mg by mouth daily.   Marland Kitchen LANTUS 100 UNIT/ML injection INJECT  25 UNITS SUBCUTANEOUSLY AT BEDTIME  . latanoprost (XALATAN) 0.005 % ophthalmic solution Place 1 drop into both eyes at bedtime.    . metFORMIN (GLUCOPHAGE) 850 MG tablet Take 1 tablet (850 mg total) by mouth 2 (two) times daily with a meal.  . pioglitazone (ACTOS) 15 MG tablet Take 1 tablet (15 mg total) by mouth 2 (two) times daily.  . vitamin B-12 (CYANOCOBALAMIN) 100 MCG tablet Take 100 mcg by mouth daily.    . [DISCONTINUED] metFORMIN (GLUCOPHAGE) 850 MG tablet Take 1 tablet (850 mg total) by mouth 2 (two) times daily with a meal.  . [DISCONTINUED] pioglitazone (ACTOS) 15 MG tablet Take 1 tablet (15 mg total) by mouth 2 (two) times daily.   No facility-administered encounter medications on file as of 02/24/2015.   No Known Allergies  ROS: The patient denies anorexia, fever, headaches, vision changes, decreased hearing, ear pain, sore throat, breast concerns, chest pain, palpitations, dizziness, syncope, dyspnea on exertion, cough, swelling, nausea, vomiting, diarrhea, constipation, abdominal pain, melena, hematochezia, indigestion/heartburn, hematuria, incontinence, dysuria, vaginal bleeding, discharge, odor or itch, genital lesions, joint pains (other than  right foot, at 1st MTP), numbness, tingling, weakness, tremor, suspicious skin lesions, depression, anxiety, abnormal bleeding/bruising, or enlarged lymph nodes. She has gained 10-15# in the last year. Right knee pain.   PHYSICAL EXAM:  BP 122/70 mmHg  Pulse 92  Ht '5\' 2"'  (1.575 m)  Wt 274 lb (124.286 kg)  BMI 50.10 kg/m2  General Appearance:   Alert, cooperative, no distress, appears stated age  Head:   Normocephalic, without obvious abnormality, atraumatic  Eyes:   PERRL, conjunctiva/corneas clear, EOM's intact, fundi not well visualized  Ears:   Normal TM's and external ear canals  Nose:  Nares normal, mucosa normal, no drainage or sinus tenderness  Throat:  Lips, mucosa, and tongue normal; upper dentures, edentulous on bottom  Neck:  Supple, no lymphadenopathy; thyroid: no enlargement/tenderness/nodules; no carotid  bruit or JVD  Back:  Spine nontender, no curvature, ROM normal, no CVA tenderness  Lungs:   Clear to auscultation bilaterally without wheezes, rales or  ronchi; respirations unlabored  Chest Wall:   No tenderness or deformity  Heart:   Regular rate and rhythm, S1 and S2 normal, no murmur, rub  or gallop  Breast Exam:   No tenderness, masses, or nipple discharge or inversion. No axillary lymphadenopathy  Abdomen:   Soft, obese, non-tender, nondistended, normoactive bowel sounds, no masses, no hepatosplenomegaly  Genitalia:   Normal external genitalia without lesions. BUS and vagina normal; cervix without lesions, or cervical motion tenderness. No abnormal vaginal discharge. Bimanual exam was very limited due to body habitus. No appreciable masses, nontender. Pap performed--stenotic cervical os.  Rectal:   Normal tone, no masses or tenderness; guaiac negative stool  Extremities:  No clubbing, cyanosis. No edema. Normal sensation to light touch.  Pulses:  2+ and symmetric all extremities  Skin:   Skin color, texture, turgor normal, no rashes or lesions  Lymph nodes:  Cervical, supraclavicular, and axillary nodes normal  Neurologic:  CNII-XII intact, normal strength, sensation and gait; reflexes 2+ and symmetric throughout  Psych: Normal mood, affect, hygiene and grooming.        Lab Results  Component Value Date   HGBA1C 6.1 02/24/2015    ASSESSMENT/PLAN:  Medicare annual wellness visit, initial  Type 2 diabetes, controlled, with retinopathy - controlled - Plan: Microalbumin / creatinine urine ratio, metFORMIN (GLUCOPHAGE) 850 MG tablet, pioglitazone (ACTOS) 15 MG tablet, Amb ref to Medical Nutrition Therapy-MNT  Pure hypercholesterolemia - Plan: Lipid panel, Amb ref to Medical Nutrition Therapy-MNT  Essential hypertension, benign - controlled - Plan: Amb ref to Medical Nutrition Therapy-MNT  Diabetes mellitus without complication - Plan: HgB A1c  CKD (chronic kidney disease), stage III - Plan: Comprehensive metabolic panel, Amb ref to Medical Nutrition Therapy-MNT  Diabetes mellitus with stage 3 chronic kidney disease - Plan: Comprehensive metabolic panel  Morbid obesity with body mass index of 45.0-49.9 in adult - weight loss encouraged. Discussed diet, exercise. refer to nutritionist. suggested Wt Watchers - Plan: Amb ref to Medical Nutrition Therapy-MNT  Pain of toe of right foot - some degenerative changes noted on x-ray.  check uric acid level. no warmth/swelling to suggest acute gout. 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 Immunization recommendations discussed--yearly high dose flu shots, given today. Colonoscopy recommendations reviewed--recent, UTD.  Counseled re: obesity Refer to nutritionist  Full Code, Full Care. Forms given for Living Will and [place].  Advised to get [name] copies of completed forms.  Medicare Attestation I have personally reviewed: The patient's medical and social history Their use of alcohol, tobacco or illicit drugs Their current medications and supplements The patient's functional ability including ADLs,fall risks, home safety risks, cognitive, and hearing and visual impairment Diet and physical activities Evidence for depression or mood disorders  The patient's weight, height, BMI, and visual acuity have been recorded in the chart.  I have made referrals, counseling, and provided education to the patient based on review of the above and I have provided the patient with a  written personalized care plan for preventive services.     Greta Yung A, MD   02/24/2015

## 2015-02-25 LAB — MICROALBUMIN / CREATININE URINE RATIO
Creatinine, Urine: 169.2 mg/dL
MICROALB UR: 0.4 mg/dL (ref <2.0)
MICROALB/CREAT RATIO: 2.4 mg/g (ref 0.0–30.0)

## 2015-02-25 MED ORDER — COLCHICINE 0.6 MG PO TABS: ORAL_TABLET | ORAL | Status: DC

## 2015-02-25 NOTE — Addendum Note (Signed)
Addended by: Herminio Commons A on: 02/25/2015 11:33 AM   Modules accepted: Orders, Medications

## 2015-02-26 ENCOUNTER — Other Ambulatory Visit: Payer: Self-pay

## 2015-02-26 DIAGNOSIS — Z1231 Encounter for screening mammogram for malignant neoplasm of breast: Secondary | ICD-10-CM

## 2015-02-26 LAB — CYTOLOGY - PAP

## 2015-03-04 ENCOUNTER — Ambulatory Visit
Admission: RE | Admit: 2015-03-04 | Discharge: 2015-03-04 | Disposition: A | Discharge status: Home / Self Care | Payer: Medicare HMO | Source: Ambulatory Visit

## 2015-03-04 DIAGNOSIS — Z1231 Encounter for screening mammogram for malignant neoplasm of breast: Secondary | ICD-10-CM

## 2015-03-13 ENCOUNTER — Other Ambulatory Visit: Payer: Self-pay | Admitting: *Deleted

## 2015-03-13 DIAGNOSIS — IMO0002 Reserved for concepts with insufficient information to code with codable children: Secondary | ICD-10-CM

## 2015-03-15 ENCOUNTER — Other Ambulatory Visit: Payer: Self-pay | Admitting: Family Medicine

## 2015-03-17 NOTE — Telephone Encounter (Signed)
DR.KNAPP IS THIS OKAY TO REFILL

## 2015-03-20 ENCOUNTER — Encounter: Payer: Commercial Managed Care - HMO | Admitting: Family Medicine

## 2015-03-25 ENCOUNTER — Other Ambulatory Visit: Payer: Self-pay | Admitting: Family Medicine

## 2015-03-25 ENCOUNTER — Encounter: Payer: Commercial Managed Care - HMO | Attending: Family Medicine | Admitting: Dietician

## 2015-03-25 ENCOUNTER — Encounter: Payer: Self-pay | Admitting: Dietician

## 2015-03-25 VITALS — Ht 62.25 in | Wt 275.7 lb

## 2015-03-25 DIAGNOSIS — Z713 Dietary counseling and surveillance: Secondary | ICD-10-CM | POA: Diagnosis not present

## 2015-03-25 DIAGNOSIS — Z6841 Body Mass Index (BMI) 40.0 and over, adult: Secondary | ICD-10-CM | POA: Insufficient documentation

## 2015-03-25 NOTE — Progress Notes (Signed)
  Medical Nutrition Therapy:  Appt start time: 1020 end time:  1110.   Assessment:  Primary concerns today: Kerri Richardson is here today since her doctor recommended that she lose weight. Started trying to lose weight "years ago" through [employer] and has lost and regained more several times. Last went to Weight Watchers 5 years. This past year has retired has gained about 15-20 lbs since she is less active overall. Does go to water aerobics 3 x week. Has not made any recent changes to her diet.  Retired and living by herself. She does her own food shopping and meal preparation at home. Doesn't normally eat breakfast. Eats out 4-5 x week. Hardly ever cooks. Doesn't normally buy sweets. Wakes up around 7 AM.   Has diabetes that is well controlled. Would like to lose 100 lbs. Has trouble finding which foods to eat. Struggles choosing foods to eat at grocery. Doesn't think portion sizes are very big. Will often make 2 meals out of restaurant meal.   Ate frozen meals when on Weight Watchers.   Preferred Learning Style:   No preference indicated   Learning Readiness:   Ready  MEDICATIONS: see list   DIETARY INTAKE:  Usual eating pattern includes 2 meals and 0-1 snacks per day.  Avoided foods include asparagus, brussels sprouts, spinach   24-hr recall:  B ( AM): none Snk ( AM): none  L ( PM):  2 hot dogs or leftovers or sandwich from fast food (fried chicken sandwich) with fries Snk ( PM): none or Reeses peanut butter cup D ( PM): ribs and macaroni and cheese or [employer] vegetables and baked spaghetti sometimes with dessert/candy Snk ( PM): none  Beverages: 1 cup of coffee with splenda and creamer,  Sprite Zero and water  Usual physical activity: water aerobics 3 x weeks for 45 minutes  Estimated energy needs: 1600 calories 180 g carbohydrates 120 g protein 44 g fat  Progress Towards Goal(s):  In progress.   Nutritional Diagnosis:  [place]-3.3 Overweight/obesity As related to hx of  physical inactivity and frequent restaurant meals.  As evidenced by BMI of 50.    Intervention:  Nutrition counseling provided. Plan: Aim to eat 3 x day (or about every 3-5 hours you are awake).  For breakfast try having a yogurt or oatmeal with an egg or fruit cup with and egg or cheese.  Try eating before exercise class.  If you are hungry, have a snack with protein and carbohydrates (peanut butter with crackers or see list).  Aim to fill half of your plate with vegetable at lunch and dinner. Choose lean protein (grilled or baked) about the size of the palm of your hand. Choose one type of starch to have a meals (quarter of your plate).  For lunch, try doing the frozen meals ([address] [place], [employer], [employer]) and add either frozen vegetables or salad green to the side.  Or for lunch, make a turkey/lean ham sandwich and vegetables.  Continue going to water aerobics 3 x week. Plan to go the [facility] to exercise 3 x week or go for some walks. Try get up and walk around about once an hour.    Teaching Method Utilized:  Visual Auditory Hands on  Handouts given during visit include:  MyPlate Handout  15 g CHO Snacks  Barriers to learning/adherence to lifestyle change: none  Demonstrated degree of understanding via:  Teach Back   Monitoring/Evaluation:  Dietary intake, exercise, and body weight prn.

## 2015-03-25 NOTE — Patient Instructions (Addendum)
Aim to eat 3 x day (or about every 3-5 hours you are awake).  For breakfast try having a yogurt or oatmeal with an egg or fruit cup with and egg or cheese.  Try eating before exercise class.  If you are hungry, have a snack with protein and carbohydrates (peanut butter with crackers or see list).  Aim to fill half of your plate with vegetable at lunch and dinner. Choose lean protein (grilled or baked) about the size of the palm of your hand. Choose one type of starch to have a meals (quarter of your plate).  For lunch, try doing the frozen meals ([address] [place], [employer], [employer]) and add either frozen vegetables or salad green to the side.  Or for lunch, make a turkey/lean ham sandwich and vegetables.  Continue going to water aerobics 3 x week. Plan to go the [facility] to exercise 3 x week or go for some walks. Try get up and walk around about once an hour.

## 2015-03-26 ENCOUNTER — Ambulatory Visit (INDEPENDENT_AMBULATORY_CARE_PROVIDER_SITE_OTHER): Payer: Commercial Managed Care - HMO | Admitting: Obstetrics and Gynecology

## 2015-03-26 ENCOUNTER — Encounter: Payer: Self-pay | Admitting: Obstetrics and Gynecology

## 2015-03-26 VITALS — BP 138/76 | HR 100 | Resp 18 | Ht 61.75 in | Wt 275.8 lb

## 2015-03-26 DIAGNOSIS — R87810 Cervical high risk human papillomavirus (HPV) DNA test positive: Secondary | ICD-10-CM

## 2015-03-26 NOTE — Progress Notes (Signed)
Patient ID: Kerri Richardson, female   DOB: November 29, 1940, 74 y.o.   MRN: [id number] 74 y.o. [id number] Single African American female here for evaluation of normal pap,positive HR HPV;Pos 18/45.Marland Kitchen    No prior colposcopy or treatment to cervix.   No postmenopausal bleeding.   No HRT.  Takes a baby aspirin a day.  Not on blood thinners.   PCP: Rita Ohara, MD  Patient's last menstrual period was 06/14/1998 (approximate).          Sexually active: No. female The current method of family planning is post menopausal status.    Exercising: No.   Smoker:  Former, quit [place] Maintenance: Pap:  02-24-15 Neg:Pos.HR HPV;Pos 18/45  History of abnormal Pap:  Yes, 02-24-15 Neg:Pos.HR HPV;Pos 18/45, 12-10-13 Neg:Pos HR HPV. MMG:  03-04-15 Density Cat.A/Neg;BiRads1:[facility] Colonoscopy:  2015 polyp with Dr. Collene Mares.  Next due 2020. BMD:   2011  Result  Normal:[facility] TDaP:  PCP Screening Labs:  Hb today:PCP , Urine today: not done   reports that she quit smoking about 14 years ago. She has never used smokeless tobacco. She reports that she does not drink alcohol or use illicit drugs.  Past Medical History  Diagnosis Date  . Type II or unspecified type diabetes mellitus without mention of complication, not stated as uncontrolled   . Pure hypercholesterolemia   . Essential hypertension, benign   . Glaucoma   . Diabetic retinopathy   . CKD (chronic kidney disease) stage 3, GFR 30-59 ml/min     Dr. [name]  . DM retinopathy ([place]) 08/17/12    early    Past Surgical History  Procedure Laterality Date  . Cataract extraction, bilateral  2012    Dr. [name]  . Colonoscopy  3/05, 11/2013    due again 2020  . Tonsillectomy  age 19  . 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 amLODipine (NORVASC) 10 MG tablet Take 1 tablet (10 mg total) by mouth daily. 90 tablet 1  .  aspirin 81 MG tablet Take 81 mg by mouth daily.      Marland Kitchen atorvastatin (LIPITOR) 20 MG tablet TAKE 1 TABLET EVERY DAY 90 tablet 1  . Blood Glucose Monitoring Suppl (ACCU-CHEK NANO SMARTVIEW) W/DEVICE KIT USE AS DIRECTED (Patient taking differently: test once daily) 1 kit 0  . Cholecalciferol (VITAMIN D) 1000 UNITS capsule Take 1,000 Units by mouth daily.      . colchicine 0.6 MG tablet Take 1-2 tablets once daily as needed for gout 30 tablet 0  . enalapril (VASOTEC) 10 MG tablet TAKE 1 TABLET EVERY DAY 90 tablet 2  . furosemide (LASIX) 40 MG tablet Take 40 mg by mouth daily.     Marland Kitchen LANTUS 100 UNIT/ML injection INJECT  25 UNITS SUBCUTANEOUSLY AT BEDTIME 10 mL 5  . latanoprost (XALATAN) 0.005 % ophthalmic solution Place 1 drop into both eyes at bedtime.      . metFORMIN (GLUCOPHAGE) 850 MG tablet Take 1 tablet (850 mg total) by mouth 2 (two) times daily with a meal. 180 tablet 1  . pioglitazone (ACTOS) 15 MG tablet Take 1 tablet (15 mg total) by mouth 2 (two) times daily. 180 tablet 1  . vitamin B-12 (CYANOCOBALAMIN) 100 MCG tablet Take 100 mcg by mouth daily.       No current facility-administered medications for this  visit.    Family History  Problem Relation Age of Onset  . Hypertension Mother   . Cancer Mother     lung (nonsmoker)  . Stroke Father   . Hypertension Father   . Diabetes Father   . Hypertension Sister   . Diabetes Sister   . Heart disease Sister   . Hypertension Sister   . Breast cancer Maternal Aunt 26    14 of old age  . Diabetes Paternal Grandmother     ROS:  Pertinent items are noted in HPI.  Otherwise, a comprehensive ROS was negative.  Exam:   BP 138/76 mmHg  Pulse 100  Resp 18  Ht 5' 1.75" (1.568 m)  Wt 275 lb 12.8 oz (125.102 kg)  BMI 50.88 kg/m2  LMP 06/14/1998 (Approximate)    General appearance: alert, cooperative and appears stated age Head: Normocephalic, without obvious abnormality, atraumatic Lungs: clear to auscultation bilaterally Heart:  regular rate and rhythm Abdomen: obese with pannus, soft, non-tender; bowel sounds normal; no masses,  no organomegaly Extremities: extremities normal, atraumatic, no cyanosis or edema   Pelvic: External genitalia:  no lesions              Urethra:  normal appearing urethra with no masses, tenderness or lesions              Bartholins and Skenes: normal                 Vagina: normal appearing vagina with normal color and discharge, no lesions              Cervix: no lesions and very high in vaginal vault.              Bimanual Exam:  Uterus:  normal size, contour, position, consistency, mobility, non-tender.  Bimanual exam limited by body habitus.               Adnexa: normal adnexa and no mass, fullness, tenderness              Rectovaginal: Yes.  .  Confirms.              Anus:  normal sphincter tone, no lesions  Chaperone was present for exam.  Assessment:     Normal pap and positive HR HPV.  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  Plan:   Comprehensive discussion of abnormal paps, HR HPR, colposcopy and treatments with LEEP, cervical conization.  Discussed healthy lifestyle.  Will return for colposcopy after precert done.   After visit summary provided.   ___30_______ minutes face to face time of which over 50% was spent in counseling.

## 2015-04-16 ENCOUNTER — Telehealth: Payer: Self-pay | Admitting: Obstetrics and Gynecology

## 2015-04-16 DIAGNOSIS — Z961 Presence of intraocular lens: Secondary | ICD-10-CM | POA: Diagnosis not present

## 2015-04-16 DIAGNOSIS — H401131 Primary open-angle glaucoma, bilateral, mild stage: Secondary | ICD-10-CM | POA: Diagnosis not present

## 2015-04-16 NOTE — Telephone Encounter (Signed)
Spoke with patient regarding benefit for colposcopy. Patient agreeable and ready to schedule. Per dr silva's note ok to schedule once precerted. Patient prefers to wait until next week. Patient scheduled 04/25/15 @ 10am. Agreeable to arrival date/time. Agreeable to 72 hour cancellation policy with $100 fee. No further questions.  Routing for review and any additional instructions for patient.

## 2015-04-16 NOTE — Telephone Encounter (Signed)
Spoke with patient. Instructions given. Motrin 800 mg po x , one hour before appointment with food. Make sure to eat a meal before appointment and drink plenty of fluids.   Routing to provider for final review. Patient agreeable to disposition. Will close encounter.

## 2015-04-25 ENCOUNTER — Encounter: Payer: Self-pay | Admitting: Obstetrics and Gynecology

## 2015-04-25 ENCOUNTER — Ambulatory Visit (INDEPENDENT_AMBULATORY_CARE_PROVIDER_SITE_OTHER): Payer: Commercial Managed Care - HMO | Admitting: Obstetrics and Gynecology

## 2015-04-25 VITALS — BP 140/70 | HR 96 | Resp 16 | Ht 61.75 in | Wt 276.0 lb

## 2015-04-25 DIAGNOSIS — N841 Polyp of cervix uteri: Secondary | ICD-10-CM

## 2015-04-25 DIAGNOSIS — R87810 Cervical high risk human papillomavirus (HPV) DNA test positive: Secondary | ICD-10-CM

## 2015-04-25 DIAGNOSIS — N72 Inflammatory disease of cervix uteri: Secondary | ICD-10-CM | POA: Diagnosis not present

## 2015-04-25 NOTE — Progress Notes (Signed)
Subjective:     Patient ID: Kerri Richardson, female   DOB: 08/16/1940, 74 y.o.   MRN: [id number]  HPI: 74 y.o.   Single  African American  female   G0P0000 with Patient's last menstrual period was 06/14/1998 (approximate).   here for  Colposcopy Procedure    GYNECOLOGIC HISTORY: Patient's last menstrual period was 06/14/1998 (approximate). Contraception: Post menopausal  Menopausal hormone therapy: None Last mammogram: 03/04/15 BIRADS1:neg Last pap smear: 02/24/15 Neg. HR HPV:+ detected 18/45 Pap 12-10-13 Neg:Pos HR HPV.   Review of Systems     Objective:   Physical Exam  Genitourinary:     Colposcopy of cervix.  Consent for procedure.  Speculum placed in vagina. - extra long speculum used and caused petechial reaction due to atrophy.  Very difficult colposcopy exam due to anatomy as well.   Cervical polyps noted making visualization of the squamocolumnar junction not possible.  Cervical polyp biopsy done.  Tissue to pathology.  Biopsy of cervix at 12:00.  Tissue to pathology.  Monsel's placed.  Minimal EBL.  No complications.     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 Cervical polyps.  Unsatisfactory colposcopy.     Plan:     Instructions/precautions given.  Follow up biopsy results.  I am anticipating a LEEP procedure in the hospital to diagnose and treat any cervical pathology.  I have already discussed this with patient and given written information about this.  Cold knife conization of the cervix is also a possibility.   ___15____ minutes face to face time of which over 50% was spent in counseling.   After visit summary to patient.

## 2015-04-25 NOTE — Patient Instructions (Signed)
Colposcopy, Care After Refer to this sheet in the next few weeks. These instructions provide you with information on caring for yourself after your procedure. Your health care provider may also give you more specific instructions. Your treatment has been planned according to current medical practices, but problems sometimes occur. Call your health care provider if you have any problems or questions after your procedure. WHAT TO EXPECT AFTER THE PROCEDURE  After your procedure, it is typical to have the following:  Cramping. This often goes away in a few minutes.  Soreness. This may last for 2 days.  Lightheadedness. Lie down for a few minutes if this occurs. You may also have some bleeding or dark discharge for a few days. You may need to wear a sanitary pad during this time. HOME CARE INSTRUCTIONS  Avoid sex, douching, and using tampons for 3 days or as directed by your health care provider.  Only take over-the-counter or prescription medicines as directed by your health care provider. Do not take aspirin because it can cause bleeding.  Continue to take birth control pills if you are on them.  Not all test results are available during your visit. If your test results are not back during the visit, make an appointment with your health care provider to find out the results. Do not assume everything is normal if you have not heard from your health care provider or the medical facility. It is important for you to follow up on all of your test results.  Follow your health care provider's advice regarding activity, follow-up visits, and follow-up Pap tests. SEEK MEDICAL CARE IF:  You develop a rash.  You have problems with your medicine. SEEK IMMEDIATE MEDICAL CARE IF:  You are bleeding heavily or are passing blood clots.  You have a fever.  You have abnormal vaginal discharge.  You are having cramps that do not go away after taking your pain medicine.  You feel lightheaded, dizzy, or  faint.  You have stomach pain.   This information is not intended to replace advice given to you by your health care provider. Make sure you discuss any questions you have with your health care provider.   Document Released: 03/21/2013 Document Reviewed: 03/21/2013 Elsevier Interactive Patient Education 2016 Elsevier Inc. Loop Electrosurgical Excision Procedure Loop electrosurgical excision procedure (LEEP) is the removal of a portion of the lower part of the uterus (cervix). The procedure is done when there are significantly abnormal cervical cell changes. Abnormal cell changes of the cervix can lead to cancer if left in place and untreated.  The LEEP procedure itself typically only takes a few minutes. Often, it may be done in your caregiver's office. The procedure is considered safe for those who wish to get pregnant or are trying to get pregnant. Only under rare circumstances should this procedure be done if you are pregnant. LET YOUR CAREGIVER KNOW ABOUT:  Whether you are pregnant or late for your last menstrual period.  Allergies to foods or medicines.  All the medicines you are taking includingherbs, eyedrops, and over-the-counter medicines, and creams.  Use of steroids (by mouth or creams).  Previous problems with anesthetics or numbing medicine.  Previous gynecological surgery.  History of blood clots or bleeding problems.  Any recent or current vaginal infections (herpes, sexually transmitted infections).  Other health problems. RISKS AND COMPLICATIONS  Bleeding.  Infection.  Injury to the vagina, bladder, or rectum.  Very rare obstruction of the cervical opening that causes problems during menstruation (cervical   stenosis). BEFORE THE PROCEDURE  Do not take aspirin or blood thinners (anticoagulants) for 1 week before the procedure, or as told by your caregiver.  Eat a light meal before the procedure.  Ask your caregiver about changing or stopping your  regular medicines.  You may be given a pain reliever 1 or 2 hours before the procedure. PROCEDURE   A tool (speculum) is placed in the vagina. This allows your caregiver to see the cervix.  An iodine stain is applied to the cervix to find the area of abnormal cells to be removed.  Medicine is injected to numb the cervix (local anesthetic).   Electricity is passed through a thin wire loop which is then used to remove (cauterize) a small segment of the affected cervix.  Light electrocautery is used to seal any small blood vessels and prevent bleeding.  A paste may be applied to the cauterized area of the cervix to help prevent bleeding.  The tissue sample is sent to the lab. It is examined under the microscope. AFTER THE PROCEDURE  Have someone drive you home.  You may have slight to moderate cramping.  You may notice a black vaginal discharge from the paste used on the cervix to prevent bleeding. This is normal.  Watch for excessive bleeding. This requires immediate medical care.  Ask when your test results will be ready. Make sure you get your test results.   This information is not intended to replace advice given to you by your health care provider. Make sure you discuss any questions you have with your health care provider.   Document Released: 08/21/2002 Document Revised: 08/23/2011 Document Reviewed: 11/10/2010 Elsevier Interactive Patient Education 2016 Elsevier Inc.  

## 2015-04-29 LAB — IPS OTHER TISSUE BIOPSY

## 2015-04-30 ENCOUNTER — Telehealth: Payer: Self-pay | Admitting: Obstetrics and Gynecology

## 2015-04-30 MED ORDER — ESTROGENS, CONJUGATED 0.625 MG/GM VA CREA: TOPICAL_CREAM | VAGINAL | Status: DC

## 2015-04-30 NOTE — Telephone Encounter (Signed)
Phone call to patient.   Colposcopy results shared with patient.  Benign polyp of cervix and acute inflammation on exocervical biopsy.  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 Significant atrophy noted in the vagina.   I am recommending a LEEP procedure to remove polyps and evaluate the cervix for dysplasia.  Visualization of the cervix is extremely difficult in the office due to body habitus and the polyps of the cervix.   I am also recommending Premarin vaginal cream 1/2 gram pv at hs for 2 weeks and then twice weekly.  I have discussed risk of thromboembolic events and need for normal mammogram prior to prescribing.  Mammogram normal in September 2016.   Will precert and schedule surgery.  Will return for preop visit.

## 2015-05-01 NOTE — Telephone Encounter (Signed)
[name]

## 2015-05-06 ENCOUNTER — Telehealth: Payer: Self-pay | Admitting: Emergency Medicine

## 2015-05-14 ENCOUNTER — Encounter: Payer: Self-pay | Admitting: Obstetrics and Gynecology

## 2015-05-14 ENCOUNTER — Ambulatory Visit (INDEPENDENT_AMBULATORY_CARE_PROVIDER_SITE_OTHER): Payer: Commercial Managed Care - HMO | Admitting: Obstetrics and Gynecology

## 2015-05-14 VITALS — BP 138/70 | HR 100 | Resp 20 | Ht 61.75 in | Wt 273.4 lb

## 2015-05-14 DIAGNOSIS — R8789 Other abnormal findings in specimens from female genital organs: Secondary | ICD-10-CM

## 2015-05-14 DIAGNOSIS — N841 Polyp of cervix uteri: Secondary | ICD-10-CM

## 2015-05-14 DIAGNOSIS — B977 Papillomavirus as the cause of diseases classified elsewhere: Secondary | ICD-10-CM

## 2015-05-14 DIAGNOSIS — R87618 Other abnormal cytological findings on specimens from cervix uteri: Secondary | ICD-10-CM

## 2015-05-14 MED ORDER — ESTROGENS, CONJUGATED 0.625 MG/GM VA CREA: TOPICAL_CREAM | VAGINAL | Status: DC

## 2015-05-14 NOTE — Progress Notes (Signed)
Patient ID: Kerri Richardson, female   DOB: 01-29-1941, 74 y.o.   MRN: [id number] GYNECOLOGY  VISIT   HPI: 74 y.o.   Single  African American  female   [place] with Patient's last menstrual period was 06/14/1998 (approximate).   here for surgical consult.   Patient is asking for clarification of the LEEP procedure - indications and expectations.   Scheduled for LEEP for normal pap and positive HR HPV 18/45.  Colposcopy unsatisfactory and has a cervical polyp.  Biopsy of the cervix showed cervicitis and polyp benign.   Unable to evaluate the cervix in office setting.   Using vaginal estrogen cream to help treat atrophy noted at the time of her colposcopy.   GYNECOLOGIC HISTORY: Patient's last menstrual period was 06/14/1998 (approximate). Contraception:Postmenopausal Menopausal hormone therapy: Premarin Vaginal Cream 1/2 gm pv qhs for 2 weeks then twice weekly. Last mammogram: 03-04-15 Density Cat.A/Neg/BiRads1:[facility]. Last pap smear: 02-24-15 Neg:Pos HR HPV and Pos 18/45; colposcopy 04-25-15 unsatisfactory        OB History    Gravida Para Term Preterm AB TAB SAB Ectopic Multiple Living   '0 0 0 0 0 0 0 0 0 0 '         Patient Active Problem List   Diagnosis Date Noted  . Morbid obesity with body mass index of 45.0-49.9 in adult [facility]) 06/10/2014  . Diabetes mellitus with stage 3 chronic kidney disease ([place]) 03/25/2014  . Hypertensive nephropathy 03/25/2014  . Type 2 diabetes, controlled, with retinopathy ([place]) 12/10/2013  . Peripheral edema 12/22/2012  . Diabetic retinopathy ([place]) 08/23/2012  . Mixed hyperlipidemia 05/03/2011  . Type II or unspecified type diabetes mellitus without mention of complication, not stated as uncontrolled 02/01/2011  . Pure hypercholesterolemia 02/01/2011  . Essential hypertension, benign 02/01/2011  . CKD (chronic kidney disease), stage III 02/01/2011    Past Medical History  Diagnosis Date  . Type II or unspecified type diabetes  mellitus without mention of complication, not stated as uncontrolled   . Pure hypercholesterolemia   . Essential hypertension, benign   . Glaucoma   . Diabetic retinopathy   . CKD (chronic kidney disease) stage 3, GFR 30-59 ml/min     Dr. [name]  . DM retinopathy ([place]) 08/17/12    early  . Gout     Past Surgical History  Procedure Laterality Date  . Cataract extraction, bilateral  2012    Dr. [name]  . Colonoscopy  3/05, 11/2013    due again 2020  . Tonsillectomy  age 15  . 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 amLODipine (NORVASC) 10 MG tablet Take 1 tablet (10 mg total) by mouth daily. 90 tablet 1  . aspirin 81 MG tablet Take 81 mg by mouth daily.      Marland Kitchen atorvastatin (LIPITOR) 20 MG tablet TAKE 1 TABLET EVERY DAY 90 tablet 1  . Blood Glucose Monitoring Suppl (ACCU-CHEK NANO SMARTVIEW) W/DEVICE KIT USE AS DIRECTED (Patient taking differently: test once daily) 1 kit 0  . Cholecalciferol (VITAMIN D) 1000 UNITS capsule Take 1,000 Units by mouth daily.      . colchicine 0.6 MG tablet Take 1-2 tablets once daily as needed for gout 30 tablet 0  . conjugated estrogens (PREMARIN) vaginal cream Use 1/2 g vaginally every night at bed time for the first 2 weeks, then use 1/2  g vaginally two times a week. 30 g 1  . enalapril (VASOTEC) 10 MG tablet TAKE 1 TABLET EVERY DAY 90 tablet 2  . furosemide (LASIX) 40 MG tablet Take 40 mg by mouth daily.     Marland Kitchen LANTUS 100 UNIT/ML injection INJECT  25 UNITS SUBCUTANEOUSLY AT BEDTIME 10 mL 5  . latanoprost (XALATAN) 0.005 % ophthalmic solution Place 1 drop into both eyes at bedtime.      . metFORMIN (GLUCOPHAGE) 850 MG tablet Take 1 tablet (850 mg total) by mouth 2 (two) times daily with a meal. 180 tablet 1  . pioglitazone (ACTOS) 15 MG tablet Take 1 tablet (15 mg total) by mouth 2 (two) times daily. 180 tablet 1  . vitamin B-12  (CYANOCOBALAMIN) 100 MCG tablet Take 100 mcg by mouth daily.       No current facility-administered medications for this visit.     ALLERGIES: Review of patient's allergies indicates no known allergies.  Family History  Problem Relation Age of Onset  . Hypertension Mother   . Cancer Mother     lung (nonsmoker)  . Stroke Father   . Hypertension Father   . Diabetes Father   . Hypertension Sister   . Diabetes Sister   . Heart disease Sister   . Hypertension Sister   . Breast cancer Maternal Aunt 23    46 of old age  . Diabetes Paternal Grandmother     Social History   Social History  . Marital Status: Single    Spouse Name: N/A  . Number of Children: N/A  . Years of Education: N/A   Occupational History  . Not on file.   Social History Main Topics  . Smoking status: Former Smoker    Quit date: 06/14/2000  . Smokeless tobacco: Never Used  . Alcohol Use: No  . Drug Use: No  . Sexual Activity: No   Other Topics Concern  . Not on file   Social History Narrative   Worked at [employer], retired 12/2013. Lives alone.  She has a sister in [place].  No children    ROS:  Pertinent items are noted in HPI.  PHYSICAL EXAMINATION:    BP 138/70 mmHg  Pulse 100  Resp 20  Ht 5' 1.75" (1.568 m)  Wt 273 lb 6.4 oz (124.013 kg)  BMI 50.44 kg/m2  LMP 06/14/1998 (Approximate)    General appearance: alert, cooperative and appears stated age Head: Normocephalic, without obvious abnormality, atraumatic Neck: no adenopathy, supple, symmetrical, trachea midline and thyroid normal to inspection and palpation Lungs: clear to auscultation bilaterally   Heart: regular rate and rhythm Abdomen: soft, non-tender; bowel sounds normal; no masses,  no organomegaly Extremities: extremities normal, atraumatic, no cyanosis or edema Skin: Skin color, texture, turgor normal. No rashes or lesions Lymph nodes: Cervical, supraclavicular, and axillary nodes normal. No abnormal inguinal nodes  palpated Neurologic: Grossly normal  Pelvic: External genitalia:  no lesions              Urethra:  normal appearing urethra with no masses, tenderness or lesions              Bartholins and Skenes: normal                 Vagina: normal appearing vagina with normal color and discharge, no lesions              Cervix: biopsy site is healing.  Challenging to see the entire cervix, especially posteriorly.  Pap taken: No. Bimanual Exam:  Uterus:  normal size, contour, position, consistency, mobility, non-tender              Adnexa: normal adnexa and no mass, fullness, tenderness              Rectovaginal: No..     Chaperone was present for exam.  ASSESSMENT  Positive HR HPV.  Unsatisfactory colposcopy.  Cervical polyp.  Vaginal atrophy.  On Premarin cream.  Diabetes.   PLAN  Counseled regarding abnormal paps and HPV. Counseled regarding LEEP procedure - risks, benefits, and alternatives.  I discussed risks which include but are not limited to bleeding, infection, damage to surrounding organs, inability to extract HPV from her tissues, cervical scarring making future evaluation more difficult, reaction to anesthesia, DVT, PE, and death.  Surgical expectations and recovery discussed. Patient agrees to proceed.  She has a preop appointment at [facility]. Continue Premarin vaginal cream 1/2 gram pv at hs for 2 weeks total and then twice weekly.  Refills and coupon given.     An After Visit Summary was printed and given to the patient.  __25____ minutes face to face time of which over 50% was spent in counseling.

## 2015-05-18 NOTE — H&P (Signed)
Patient ID: Kerri Richardson, female DOB: 1941/02/27, 74 y.o. MRN: [id number] GYNECOLOGY VISIT  HPI: 74 y.o. Single African American female  [place] with Patient's last menstrual period was 06/14/1998 (approximate).  here for surgical consult.   Patient is asking for clarification of the LEEP procedure - indications and expectations.   Scheduled for LEEP for normal pap and positive HR HPV 18/45.  Colposcopy unsatisfactory and has a cervical polyp.  Biopsy of the cervix showed cervicitis and polyp benign.  Unable to evaluate the cervix in office setting.   Using vaginal estrogen cream to help treat atrophy noted at the time of her colposcopy.   GYNECOLOGIC HISTORY: Patient's last menstrual period was 06/14/1998 (approximate). Contraception:Postmenopausal Menopausal hormone therapy: Premarin Vaginal Cream 1/2 gm pv qhs for 2 weeks then twice weekly. Last mammogram: 03-04-15 D[facility] Cat[facility]Neg[facility]BiRads[facility]. Last pap smear: 02-24-15 Neg:Pos HR HPV and Pos 18/45; colposcopy 04-25-15 unsatisfactory   OB History    Gravida Para Term Preterm AB TAB SAB Ectopic Multiple Living   '0 0 0 0 0 0 0 0 0 0 '       Patient Active Problem List   Diagnosis Date Noted  . Morbid obesity with body mass index of 45.0-49.9 in adult [facility]) 06/10/2014  . Diabetes mellitus with stage 3 chronic kidney disease ([place]) 03/25/2014  . Hypertensive nephropathy 03/25/2014  . Type 2 diabetes, controlled, with retinopathy ([place]) 12/10/2013  . Peripheral edema 12/22/2012  . Diabetic retinopathy ([place]) 08/23/2012  . Mixed hyperlipidemia 05/03/2011  . Type II or unspecified type diabetes mellitus without mention of complication, not stated as uncontrolled 02/01/2011  . Pure hypercholesterolemia 02/01/2011  . Essential hypertension, benign 02/01/2011  . CKD (chronic kidney disease), stage III 02/01/2011    Past  Medical History  Diagnosis Date  . Type II or unspecified type diabetes mellitus without mention of complication, not stated as uncontrolled   . Pure hypercholesterolemia   . Essential hypertension, benign   . Glaucoma   . Diabetic retinopathy   . CKD (chronic kidney disease) stage 3, GFR 30-59 ml/min     Dr. [name]  . DM retinopathy ([place]) 08/17/12    early  . Gout     Past Surgical History  Procedure Laterality Date  . Cataract extraction, bilateral  2012    Dr. [name]  . Colonoscopy  3/05, 11/2013    due again 2020  . Tonsillectomy  age 73  . 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 amLODipine (NORVASC) 10 MG tablet Take 1 tablet (10 mg total) by mouth daily. 90 tablet 1  . aspirin 81 MG tablet Take 81 mg by mouth daily.     Marland Kitchen atorvastatin (LIPITOR) 20 MG tablet TAKE 1 TABLET EVERY DAY 90 tablet 1  . Blood Glucose Monitoring Suppl (ACCU-CHEK NANO SMARTVIEW) W/DEVICE KIT USE AS DIRECTED (Patient taking differently: test once daily) 1 kit 0  . Cholecalciferol (VITAMIN D) 1000 UNITS capsule Take 1,000 Units by mouth daily.     . colchicine 0.6 MG tablet Take 1-2 tablets once daily as needed for gout 30 tablet 0  . conjugated estrogens (PREMARIN) vaginal cream Use 1/2 g vaginally every night at bed time for the first 2 weeks, then use 1/2 g vaginally two times a week. 30 g 1  . enalapril (VASOTEC) 10 MG tablet TAKE 1 TABLET EVERY DAY 90  tablet 2  . furosemide (LASIX) 40 MG tablet Take 40 mg by mouth daily.     Marland Kitchen LANTUS 100 UNIT/ML injection INJECT 25 UNITS SUBCUTANEOUSLY AT BEDTIME 10 mL 5  . latanoprost (XALATAN) 0.005 % ophthalmic solution Place 1 drop into both eyes at bedtime.     . metFORMIN (GLUCOPHAGE) 850 MG tablet Take 1 tablet  (850 mg total) by mouth 2 (two) times daily with a meal. 180 tablet 1  . pioglitazone (ACTOS) 15 MG tablet Take 1 tablet (15 mg total) by mouth 2 (two) times daily. 180 tablet 1  . vitamin B-12 (CYANOCOBALAMIN) 100 MCG tablet Take 100 mcg by mouth daily.      No current facility-administered medications for this visit.     ALLERGIES: Review of patient's allergies indicates no known allergies.  Family History  Problem Relation Age of Onset  . Hypertension Mother   . Cancer Mother     lung (nonsmoker)  . Stroke Father   . Hypertension Father   . Diabetes Father   . Hypertension Sister   . Diabetes Sister   . Heart disease Sister   . Hypertension Sister   . Breast cancer Maternal Aunt 63    76 of old age  . Diabetes Paternal Grandmother     Social History   Social History  . Marital Status: Single    Spouse Name: N/A  . Number of Children: N/A  . Years of Education: N/A   Occupational History  . Not on file.   Social History Main Topics  . Smoking status: Former Smoker    Quit date: 06/14/2000  . Smokeless tobacco: Never Used  . Alcohol Use: No  . Drug Use: No  . Sexual Activity: No   Other Topics Concern  . Not on file   Social History Narrative   Worked at [employer], retired 12/2013. Lives alone. She has a sister in [place]. No children    ROS: Pertinent items are noted in HPI.  PHYSICAL EXAMINATION:   BP 138/70 mmHg  Pulse 100  Resp 20  Ht 5' 1.75" (1.568 m)  Wt 273 lb 6.4 oz (124.013 kg)  BMI 50.44 kg/m2  LMP 06/14/1998 (Approximate)  General appearance: alert, cooperative and appears stated age Head: Normocephalic, without obvious abnormality, atraumatic Neck: no adenopathy, supple, symmetrical, trachea midline and thyroid normal to inspection and palpation Lungs: clear to auscultation bilaterally  Heart:  regular rate and rhythm Abdomen: soft, non-tender; bowel sounds normal; no masses, no organomegaly Extremities: extremities normal, atraumatic, no cyanosis or edema Skin: Skin color, texture, turgor normal. No rashes or lesions Lymph nodes: Cervical, supraclavicular, and axillary nodes normal. No abnormal inguinal nodes palpated Neurologic: Grossly normal  Pelvic: External genitalia: no lesions  Urethra: normal appearing urethra with no masses, tenderness or lesions  Bartholins and Skenes: normal   Vagina: normal appearing vagina with normal color and discharge, no lesions  Cervix: biopsy site is healing.  Challenging to see the entire cervix, especially posteriorly.   Pap taken: No. Bimanual Exam: Uterus: normal size, contour, position, consistency, mobility, non-tender  Adnexa: normal adnexa and no mass, fullness, tenderness  Rectovaginal: No..   Chaperone was present for exam.  ASSESSMENT  Positive HR HPV.  Unsatisfactory colposcopy.  Cervical polyp.  Vaginal atrophy. On Premarin cream.  Diabetes.   PLAN  Counseled regarding abnormal paps and HPV. Counseled regarding LEEP procedure - risks, benefits, and alternatives. I discussed risks which include but are not limited to bleeding, infection, damage to  surrounding organs, inability to extract HPV from her tissues, cervical scarring making future evaluation more difficult, reaction to anesthesia, DVT, PE, and death.  Surgical expectations and recovery discussed. Patient agrees to proceed.  She has a preop appointment at [facility]. Continue Premarin vaginal cream 1/2 gram pv at hs for 2 weeks total and then twice weekly. Refills and coupon given.   An After Visit Summary was printed and given to the patient.  __25____ minutes face to face time of which over 50% was spent in counseling.

## 2015-05-19 ENCOUNTER — Telehealth: Payer: Self-pay | Admitting: Obstetrics and Gynecology

## 2015-05-19 NOTE — Telephone Encounter (Signed)
Left message to call Necole Minassian at [phone]. 

## 2015-05-19 NOTE — Telephone Encounter (Signed)
Patient is asking to talk with Dr.Silva's nurse. No further details given. Last seen 05/14/15.

## 2015-05-22 NOTE — Telephone Encounter (Signed)
Spoke with patient. Advised of message as seen below from Dr.Miller. Patient is agreeable and verbalizes understanding.  Routing to provider for final review. Patient agreeable to disposition. Will close encounter   

## 2015-05-22 NOTE — Telephone Encounter (Signed)
OK to stop all vaginal estrogen.

## 2015-05-22 NOTE — Telephone Encounter (Signed)
Patient returning call. Number updated above for today.

## 2015-05-22 NOTE — Telephone Encounter (Signed)
Spoke with patient. Patient states that she went to pick up a refill of her Premarin and it is going to be $100. Patient states she is not able to afford this. Patient has not contacted her insurance regarding alternatives. Offered to contact her insurance to verify alternatives. Patient declines at this time. Patient would like me to speak with Dr.Silva to see if this is a medication she needs to continue or not before researching an alternative.

## 2015-05-22 NOTE — Telephone Encounter (Signed)
Left message to call Kaitlyn at [phone]. 

## 2015-05-26 ENCOUNTER — Encounter (HOSPITAL_COMMUNITY): Payer: Self-pay

## 2015-05-26 ENCOUNTER — Encounter (HOSPITAL_COMMUNITY)
Admission: RE | Admit: 2015-05-26 | Discharge: 2015-05-26 | Disposition: A | Discharge status: Home / Self Care | Payer: Commercial Managed Care - HMO | Source: Ambulatory Visit | Attending: Obstetrics and Gynecology | Admitting: Obstetrics and Gynecology

## 2015-05-26 ENCOUNTER — Other Ambulatory Visit: Payer: Self-pay

## 2015-05-26 DIAGNOSIS — N952 Postmenopausal atrophic vaginitis: Secondary | ICD-10-CM | POA: Diagnosis not present

## 2015-05-26 DIAGNOSIS — Z6841 Body Mass Index (BMI) 40.0 and over, adult: Secondary | ICD-10-CM | POA: Diagnosis not present

## 2015-05-26 DIAGNOSIS — A630 Anogenital (venereal) warts: Secondary | ICD-10-CM | POA: Diagnosis not present

## 2015-05-26 DIAGNOSIS — N841 Polyp of cervix uteri: Secondary | ICD-10-CM | POA: Diagnosis not present

## 2015-05-26 DIAGNOSIS — E1122 Type 2 diabetes mellitus with diabetic chronic kidney disease: Secondary | ICD-10-CM | POA: Diagnosis not present

## 2015-05-26 DIAGNOSIS — I129 Hypertensive chronic kidney disease with stage 1 through stage 4 chronic kidney disease, or unspecified chronic kidney disease: Secondary | ICD-10-CM | POA: Diagnosis not present

## 2015-05-26 DIAGNOSIS — N183 Chronic kidney disease, stage 3 (moderate): Secondary | ICD-10-CM | POA: Diagnosis not present

## 2015-05-26 DIAGNOSIS — Z87891 Personal history of nicotine dependence: Secondary | ICD-10-CM | POA: Diagnosis not present

## 2015-05-26 HISTORY — DX: Unspecified osteoarthritis, unspecified site: M19.90

## 2015-05-26 LAB — BASIC METABOLIC PANEL
Anion gap: 8 (ref 5–15)
BUN: 21 mg/dL — AB (ref 6–20)
CO2: 27 mmol/L (ref 22–32)
CREATININE: 1.29 mg/dL — AB (ref 0.44–1.00)
Calcium: 9 mg/dL (ref 8.9–10.3)
Chloride: 105 mmol/L (ref 101–111)
GFR calc Af Amer: 47 mL/min — ABNORMAL LOW (ref >60)
GFR, EST NON AFRICAN AMERICAN: 40 mL/min — AB (ref >60)
GLUCOSE: 129 mg/dL — AB (ref 65–99)
Potassium: 4.1 mmol/L (ref 3.5–5.1)
SODIUM: 140 mmol/L (ref 135–145)

## 2015-05-26 LAB — CBC
HCT: 39.6 % (ref 36.0–46.0)
Hemoglobin: 12.4 g/dL (ref 12.0–15.0)
MCH: 27.7 pg (ref 26.0–34.0)
MCHC: 31.3 g/dL (ref 30.0–36.0)
MCV: 88.4 fL (ref 78.0–100.0)
Platelets: 287 10*3/uL (ref 150–400)
RBC: 4.48 MIL/uL (ref 3.87–5.11)
RDW: 15 % (ref 11.5–15.5)
WBC: 6.7 10*3/uL (ref 4.0–10.5)

## 2015-05-26 NOTE — Patient Instructions (Addendum)
Your procedure is scheduled on:  Tuesday, Dec. 13, 2016  [facility]:  7:45 A.M.  Pick up the phone at the desk and dial [phone].  Call this number if you have problems the morning of surgery: [phone].  Remember: Do NOT eat food or drink after:  Midnight Tonight Take these medicines the morning of surgery with a SIP OF WATER:  Amlodipine, Enalapril  *Take 1/2 (Half) bedtime insulin dose night before surgery  *Do NOT take Metformin 24 hours prior to surgery  Do NOT wear jewelry (body piercing), metal hair clips/bobby pins, make-up, or nail polish. Do NOT wear lotions, powders, or perfumes.  You may wear deoderant. Do NOT shave for 48 hours prior to surgery. Do NOT bring valuables to the hospital. Contacts, dentures, or bridgework may not be worn into surgery.  Have a responsible adult drive you home and stay with you for 24 hours after your procedure

## 2015-05-27 ENCOUNTER — Ambulatory Visit (HOSPITAL_COMMUNITY): Payer: Commercial Managed Care - HMO | Admitting: Registered Nurse

## 2015-05-27 ENCOUNTER — Encounter (HOSPITAL_COMMUNITY): Payer: Self-pay | Admitting: Emergency Medicine

## 2015-05-27 ENCOUNTER — Ambulatory Visit (HOSPITAL_COMMUNITY)
Admission: RE | Admit: 2015-05-27 | Discharge: 2015-05-27 | Disposition: A | Discharge status: Home / Self Care | Payer: Commercial Managed Care - HMO | Source: Ambulatory Visit | Attending: Obstetrics and Gynecology | Admitting: Obstetrics and Gynecology

## 2015-05-27 ENCOUNTER — Encounter (HOSPITAL_COMMUNITY)
Admission: RE | Disposition: A | Discharge status: Home / Self Care | Payer: Self-pay | Source: Ambulatory Visit | Attending: Obstetrics and Gynecology

## 2015-05-27 DIAGNOSIS — R8789 Other abnormal findings in specimens from female genital organs: Secondary | ICD-10-CM | POA: Diagnosis not present

## 2015-05-27 DIAGNOSIS — I129 Hypertensive chronic kidney disease with stage 1 through stage 4 chronic kidney disease, or unspecified chronic kidney disease: Secondary | ICD-10-CM | POA: Insufficient documentation

## 2015-05-27 DIAGNOSIS — Z87891 Personal history of nicotine dependence: Secondary | ICD-10-CM | POA: Diagnosis not present

## 2015-05-27 DIAGNOSIS — I10 Essential (primary) hypertension: Secondary | ICD-10-CM | POA: Diagnosis not present

## 2015-05-27 DIAGNOSIS — N952 Postmenopausal atrophic vaginitis: Secondary | ICD-10-CM | POA: Insufficient documentation

## 2015-05-27 DIAGNOSIS — N841 Polyp of cervix uteri: Secondary | ICD-10-CM | POA: Diagnosis not present

## 2015-05-27 DIAGNOSIS — N870 Mild cervical dysplasia: Secondary | ICD-10-CM | POA: Diagnosis not present

## 2015-05-27 DIAGNOSIS — N183 Chronic kidney disease, stage 3 (moderate): Secondary | ICD-10-CM | POA: Diagnosis not present

## 2015-05-27 DIAGNOSIS — E1122 Type 2 diabetes mellitus with diabetic chronic kidney disease: Secondary | ICD-10-CM | POA: Diagnosis not present

## 2015-05-27 DIAGNOSIS — A630 Anogenital (venereal) warts: Secondary | ICD-10-CM | POA: Diagnosis not present

## 2015-05-27 DIAGNOSIS — Z6841 Body Mass Index (BMI) 40.0 and over, adult: Secondary | ICD-10-CM | POA: Diagnosis not present

## 2015-05-27 DIAGNOSIS — R87615 Unsatisfactory cytologic smear of cervix: Secondary | ICD-10-CM | POA: Diagnosis not present

## 2015-05-27 HISTORY — PX: COLPOSCOPY: SHX161

## 2015-05-27 HISTORY — PX: LEEP: SHX91

## 2015-05-27 LAB — GLUCOSE, CAPILLARY: Glucose-Capillary: 111 mg/dL — ABNORMAL HIGH (ref 65–99)

## 2015-05-27 SURGERY — LEEP (LOOP ELECTROSURGICAL EXCISION PROCEDURE)
Anesthesia: General | Site: Vagina

## 2015-05-27 MED ORDER — LACTATED RINGERS IV SOLN: INTRAVENOUS | Status: DC

## 2015-05-27 MED ORDER — PROPOFOL 10 MG/ML IV BOLUS
INTRAVENOUS | Status: AC
  Filled 2015-05-27: qty 20

## 2015-05-27 MED ORDER — ONDANSETRON HCL 4 MG/2ML IJ SOLN: 4.0000 mg | Freq: Once | INTRAMUSCULAR | Status: DC | PRN

## 2015-05-27 MED ORDER — DEXAMETHASONE SODIUM PHOSPHATE 4 MG/ML IJ SOLN
INTRAMUSCULAR | Status: AC
  Filled 2015-05-27: qty 1

## 2015-05-27 MED ORDER — FERRIC SUBSULFATE 259 MG/GM EX SOLN
CUTANEOUS | Status: AC
Start: 2015-05-27 — End: 2015-05-27
  Filled 2015-05-27: qty 8

## 2015-05-27 MED ORDER — LIDOCAINE HCL 1 % IJ SOLN
INTRAMUSCULAR | Status: AC
  Filled 2015-05-27: qty 20

## 2015-05-27 MED ORDER — LIDOCAINE-EPINEPHRINE 0.5 %-1:200000 IJ SOLN
INTRAMUSCULAR | Status: AC
  Filled 2015-05-27: qty 1

## 2015-05-27 MED ORDER — IODINE STRONG (LUGOLS) 5 % PO SOLN
ORAL | Status: AC
  Filled 2015-05-27: qty 1

## 2015-05-27 MED ORDER — FENTANYL CITRATE (PF) 100 MCG/2ML IJ SOLN
INTRAMUSCULAR | Status: AC
  Filled 2015-05-27: qty 2

## 2015-05-27 MED ORDER — ACETIC ACID 5 % SOLN
Status: AC
  Filled 2015-05-27: qty 500

## 2015-05-27 MED ORDER — MEPERIDINE HCL 25 MG/ML IJ SOLN: 6.2500 mg | INTRAMUSCULAR | Status: DC | PRN

## 2015-05-27 MED ORDER — HYDROCODONE-ACETAMINOPHEN 7.5-325 MG PO TABS: 1.0000 | ORAL_TABLET | Freq: Once | ORAL | Status: DC | PRN

## 2015-05-27 MED ORDER — PROPOFOL 10 MG/ML IV BOLUS
INTRAVENOUS | Status: DC | PRN
  Administered 2015-05-27: 170 mg via INTRAVENOUS
  Administered 2015-05-27: 50 mg via INTRAVENOUS

## 2015-05-27 MED ORDER — KETOROLAC TROMETHAMINE 30 MG/ML IJ SOLN
30.0000 mg | Freq: Once | INTRAMUSCULAR | Status: DC | PRN
Start: 2015-05-27 — End: 2015-05-27

## 2015-05-27 MED ORDER — LACTATED RINGERS IV SOLN
INTRAVENOUS | Status: DC
  Administered 2015-05-27 (×2): via INTRAVENOUS

## 2015-05-27 MED ORDER — LIDOCAINE HCL (CARDIAC) 20 MG/ML IV SOLN
INTRAVENOUS | Status: DC | PRN
  Administered 2015-05-27: 100 mg via INTRAVENOUS

## 2015-05-27 MED ORDER — IODINE STRONG (LUGOLS) 5 % PO SOLN
ORAL | Status: DC | PRN
  Administered 2015-05-27: 0.2 mL via ORAL

## 2015-05-27 MED ORDER — LIDOCAINE-EPINEPHRINE (PF) 1 %-1:200000 IJ SOLN
INTRAMUSCULAR | Status: DC | PRN
  Administered 2015-05-27: 14 mL

## 2015-05-27 MED ORDER — ONDANSETRON HCL 4 MG/2ML IJ SOLN
INTRAMUSCULAR | Status: AC
  Filled 2015-05-27: qty 2

## 2015-05-27 MED ORDER — ONDANSETRON HCL 4 MG/2ML IJ SOLN
INTRAMUSCULAR | Status: DC | PRN
  Administered 2015-05-27: 4 mg via INTRAVENOUS

## 2015-05-27 MED ORDER — FENTANYL CITRATE (PF) 100 MCG/2ML IJ SOLN
INTRAMUSCULAR | Status: DC | PRN
  Administered 2015-05-27: 50 ug via INTRAVENOUS

## 2015-05-27 MED ORDER — LIDOCAINE-EPINEPHRINE (PF) 1 %-1:200000 IJ SOLN
INTRAMUSCULAR | Status: AC
  Filled 2015-05-27: qty 30

## 2015-05-27 MED ORDER — LIDOCAINE HCL (CARDIAC) 20 MG/ML IV SOLN
INTRAVENOUS | Status: AC
  Filled 2015-05-27: qty 5

## 2015-05-27 SURGICAL SUPPLY — 33 items
APPLICATOR COTTON TIP 6IN STRL (MISCELLANEOUS) ×3 IMPLANT
CLOTH BEACON ORANGE TIMEOUT ST (SAFETY) ×3 IMPLANT
CONTAINER PREFILL 10% NBF 15ML (MISCELLANEOUS) ×6 IMPLANT
COUNTER NEEDLE 1200 MAGNETIC (NEEDLE) IMPLANT
ELECT BALL LEEP 5MM RED (ELECTRODE) ×3 IMPLANT
ELECT LOOP LEEP RND 15X12 GRN (CUTTING LOOP)
ELECT LOOP LEEP RND 20X12 WHT (CUTTING LOOP) ×6
ELECT LOOP LEEP SQR 10X10 ORG (CUTTING LOOP) ×3
ELECT REM PT RETURN 9FT ADLT (ELECTROSURGICAL) ×3
ELECTRODE LOOP LP RND 15X12GRN (CUTTING LOOP) IMPLANT
ELECTRODE LOOP LP RND 20X12WHT (CUTTING LOOP) ×2 IMPLANT
ELECTRODE LOOP LP SQR 10X10ORG (CUTTING LOOP) ×1 IMPLANT
ELECTRODE REM PT RTRN 9FT ADLT (ELECTROSURGICAL) ×1 IMPLANT
EVACUATOR PREFILTER SMOKE (MISCELLANEOUS) ×3 IMPLANT
EXTENDER ELECT LOOP LEEP 10CM (CUTTING LOOP) IMPLANT
GLOVE BIO SURGEON STRL SZ 6.5 (GLOVE) ×2 IMPLANT
GLOVE BIO SURGEONS STRL SZ 6.5 (GLOVE) ×1
GLOVE BIOGEL PI IND STRL 7.0 (GLOVE) ×1 IMPLANT
GLOVE BIOGEL PI INDICATOR 7.0 (GLOVE) ×2
GOWN STRL REUS W/TWL LRG LVL3 (GOWN DISPOSABLE) ×6 IMPLANT
HOSE NS SMOKE EVAC 7/8 X6 (MISCELLANEOUS) ×2 IMPLANT
HOSE NS SMOKE EVAC 7/8 X6' (MISCELLANEOUS) ×1
NS IRRIG 1000ML POUR BTL (IV SOLUTION) ×3 IMPLANT
PACK VAGINAL MINOR WOMEN LF (CUSTOM PROCEDURE TRAY) ×3 IMPLANT
PAD OB MATERNITY 4.3X12.25 (PERSONAL CARE ITEMS) ×3 IMPLANT
PENCIL BUTTON HOLSTER BLD 10FT (ELECTRODE) ×3 IMPLANT
REDUCER FITTING SMOKE EVAC (MISCELLANEOUS) ×3 IMPLANT
SCOPETTES 8  STERILE (MISCELLANEOUS) ×4
SCOPETTES 8 STERILE (MISCELLANEOUS) ×2 IMPLANT
SUT SILK 2 0 SH (SUTURE) ×3 IMPLANT
TOWEL OR 17X24 6PK STRL BLUE (TOWEL DISPOSABLE) ×6 IMPLANT
TUBING SMOKE EVAC HOSE ADAPTER (MISCELLANEOUS) ×3 IMPLANT
WATER STERILE IRR 1000ML POUR (IV SOLUTION) ×3 IMPLANT

## 2015-05-27 NOTE — Anesthesia Preprocedure Evaluation (Signed)
Anesthesia Evaluation  Patient identified by MRN, date of birth, ID band Patient awake    Reviewed: Allergy & Precautions, H&P , NPO status , Patient's Chart, lab work & pertinent test results, reviewed documented beta blocker date and time   Airway Mallampati: II  TM Distance: >3 FB Neck ROM: full    Dental no notable dental hx. (+) Teeth Intact   Pulmonary former smoker,    Pulmonary exam normal        Cardiovascular hypertension, Pt. on medications negative cardio ROS Normal cardiovascular exam     Neuro/Psych negative neurological ROS  negative psych ROS   GI/Hepatic negative GI ROS, Neg liver ROS,   Endo/Other  diabetes, Well Controlled, Type obesity  Renal/GU      Musculoskeletal   Abdominal (+) + obese,   Peds  Hematology negative hematology ROS (+)   Anesthesia Other Findings   Reproductive/Obstetrics negative OB ROS                             Anesthesia Physical Anesthesia Plan  ASA: III  Anesthesia Plan: General   Post-op Pain Management:    Induction: Intravenous  Airway Management Planned: LMA  Additional Equipment:   Intra-op Plan:   Post-operative Plan: Extubation in OR  Informed Consent: I have reviewed the patients History and Physical, chart, labs and discussed the procedure including the risks, benefits and alternatives for the proposed anesthesia with the patient or authorized representative who has indicated his/her understanding and acceptance.     Plan Discussed with: CRNA and Surgeon  Anesthesia Plan Comments:         Anesthesia Quick Evaluation

## 2015-05-27 NOTE — Discharge Instructions (Signed)
Loop Electrosurgical Excision Procedure, Care After °Refer to this sheet in the next few weeks. These instructions provide you with information on caring for yourself after your procedure. Your caregiver may also give you more specific instructions. Your treatment has been planned according to current medical practices, but problems sometimes occur. Call your caregiver if you have any problems or questions after your procedure. °HOME CARE INSTRUCTIONS  °· Do not use tampons, douche, or have sexual intercourse for 2 weeks or as directed by your caregiver. °· Begin normal activities if you have no or minimal cramping or bleeding, unless directed otherwise by your caregiver. °· Take your temperature if you feel sick. Write down your temperature on paper, and tell your caregiver if you have a fever. °· Take all medicines as directed by your caregiver. °· Keep all your follow-up appointments and Pap tests as directed by your caregiver. °SEEK IMMEDIATE MEDICAL CARE IF:  °· You have bleeding that is heavier or longer than a normal menstrual cycle. °· You have bleeding that is bright red. °· You have blood clots. °· You have a fever. °· You have increasing cramps or pain not relieved by medicine. °· You develop abdominal pain that does not seem to be related to the same area of earlier cramping and pain. °· You are lightheaded, unusually weak, or faint. °· You develop painful or bloody urination. °· You develop a bad smelling vaginal discharge. °MAKE SURE YOU: °· Understand these instructions. °· Will watch your condition. °· Will get help right away if you are not doing well or get worse. °  °This information is not intended to replace advice given to you by your health care provider. Make sure you discuss any questions you have with your health care provider. °  °Document Released: 02/11/2011 Document Revised: 06/21/2014 Document Reviewed: 02/11/2011 °Elsevier Interactive Patient Education ©2016 [employer] ° ° ° °Post  Anesthesia Home Care Instructions ° °Activity: °Get plenty of rest for the remainder of the day. A responsible adult should stay with you for 24 hours following the procedure.  °For the next 24 hours, DO NOT: °-Drive a car °-Operate machinery °-Drink alcoholic beverages °-Take any medication unless instructed by your physician °-Make any legal decisions or sign important papers. ° °Meals: °Start with liquid foods such as gelatin or soup. Progress to regular foods as tolerated. Avoid greasy, spicy, heavy foods. If nausea and/or vomiting occur, drink only clear liquids until the nausea and/or vomiting subsides. Call your physician if vomiting continues. ° °Special Instructions/Symptoms: °Your throat may feel dry or sore from the anesthesia or the breathing tube placed in your throat during surgery. If this causes discomfort, gargle with warm salt water. The discomfort should disappear within 24 hours. ° °If you had a scopolamine patch placed behind your ear for the management of post- operative nausea and/or vomiting: ° °1. The medication in the patch is effective for 72 hours, after which it should be removed.  Wrap patch in a tissue and discard in the trash. Wash hands thoroughly with soap and water. °2. You may remove the patch earlier than 72 hours if you experience unpleasant side effects which may include dry mouth, dizziness or visual disturbances. °3. Avoid touching the patch. Wash your hands with soap and water after contact with the patch. °  ° ° °

## 2015-05-27 NOTE — Transfer of Care (Signed)
Immediate Anesthesia Transfer of Care Note  Patient: Kerri Richardson  Procedure(s) Performed: Procedure(s): LOOP ELECTROSURGICAL EXCISION PROCEDURE (LEEP) (N/A) COLPOSCOPY (N/A)  Patient Location: PACU  Anesthesia Type:General  Level of Consciousness: awake, alert  and oriented  Airway & Oxygen Therapy: Patient Spontanous Breathing and Patient connected to nasal cannula oxygen  Post-op Assessment: Report given to RN  Post vital signs: Reviewed  Last Vitals:  Filed Vitals:   05/27/15 0810  BP: 157/76  Pulse: 103  Temp: 36.8 C  Resp: 18    Complications: No apparent anesthesia complications

## 2015-05-27 NOTE — Progress Notes (Signed)
Update to History and Physical  No marked change in status since office preop visit.  Patient does not take NSAIDs due to chronic kidney disease.  Cr 1.29.  Patient examined.   OK to proceed with surgery.

## 2015-05-27 NOTE — Anesthesia Procedure Notes (Signed)
Procedure Name: LMA Insertion Date/Time: 05/27/2015 9:41 AM Performed by: Jhonnie GarnerMARSHALL, Sha Amer M Pre-anesthesia Checklist: Patient identified, Emergency Drugs available, Suction available and Patient being monitored Patient Re-evaluated:Patient Re-evaluated prior to inductionOxygen Delivery Method: Circle system utilized Preoxygenation: Pre-oxygenation with 100% oxygen Intubation Type: IV induction Ventilation: Mask ventilation without difficulty LMA: LMA inserted LMA Size: 4.0 Number of attempts: 1 Airway Equipment and Method: Patient positioned with wedge pillow (LMA supreme) Placement Confirmation: positive ETCO2 and breath sounds checked- equal and bilateral Tube secured with: Tape Dental Injury: Teeth and Oropharynx as per pre-operative assessment

## 2015-05-27 NOTE — Anesthesia Postprocedure Evaluation (Signed)
Anesthesia Post Note  Patient: Kerri Richardson  Procedure(s) Performed: Procedure(s) (LRB): LOOP ELECTROSURGICAL EXCISION PROCEDURE (LEEP) (N/A) COLPOSCOPY (N/A)  Patient location during evaluation: PACU Anesthesia Type: General Level of consciousness: awake and alert Pain management: pain level controlled Vital Signs Assessment: post-procedure vital signs reviewed and stable Respiratory status: spontaneous breathing, nonlabored ventilation and respiratory function stable Cardiovascular status: blood pressure returned to baseline and stable Postop Assessment: no signs of nausea or vomiting Anesthetic complications: no    Last Vitals:  Filed Vitals:   05/27/15 1125 05/27/15 1157  BP:  152/84  Pulse: 74 95  Temp: 36.7 C   Resp: 16 20    Last Pain: There were no vitals filed for this visit.               Wyett Narine A.

## 2015-05-27 NOTE — Brief Op Note (Signed)
05/27/2015  10:36 AM  PATIENT:  Kerri Richardson  74 y.o. female  PRE-OPERATIVE DIAGNOSIS:  cervical polyp, HR HPV positive, unsatisfactory colpo   POST-OPERATIVE DIAGNOSIS:  cervical polyp, HR HPV positive, unsatisfactory colpo  PROCEDURE:  Procedure(s): LOOP ELECTROSURGICAL EXCISION PROCEDURE (LEEP) (N/A) COLPOSCOPY (N/A)  Endocervical curettage.  SURGEON:  Surgeon(s) and Role:    * Tristan Proto E Ardell IsaacsAmundson C Silva, MD - Primary    * Romualdo BolkJill Evelyn Jertson, MD - Assisting  PHYSICIAN ASSISTANT: NA  ASSISTANTS: Romualdo BolkJill Evelyn Jertson, MD   ANESTHESIA:   local and  LMA.  EBL:  Total I/O In: 1000 [I.V.:1000] Out: 30 [Urine:25; Blood:5]  BLOOD ADMINISTERED:none  DRAINS: none   LOCAL MEDICATIONS USED:  OTHER Lidocaine 1% with epinephrine 1:200,000  SPECIMEN:  Source of Specimen:  LEEP - Black pin at 12:00, Green pin at 6:00, Blue pin at 9:00 with no orientation, endocervical button with red pin at 12:00.  ECC.  DISPOSITION OF SPECIMEN:  PATHOLOGY  COUNTS:  YES  TOURNIQUET:  * No tourniquets in log *  DICTATION: .Other Dictation: Dictation Number    PLAN OF CARE: Discharge to home after PACU  PATIENT DISPOSITION:  PACU - hemodynamically stable.   Delay start of Pharmacological VTE agent (>24hrs) due to surgical blood loss or risk of bleeding: not applicable

## 2015-05-28 ENCOUNTER — Encounter (HOSPITAL_COMMUNITY): Payer: Self-pay | Admitting: Obstetrics and Gynecology

## 2015-05-28 LAB — GLUCOSE, CAPILLARY: GLUCOSE-CAPILLARY: 107 mg/dL — AB (ref 65–99)

## 2015-05-28 NOTE — Op Note (Signed)
NAMEJOHANY, HANSMAN              ACCOUNT NO.:  [id number]  MEDICAL RECORD NO.:  [id number]  LOCATION:  [facility]                          FACILITY:  [facility]  PHYSICIAN:  Randye Lobo, M.D.   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  SURGEON:  Randye Lobo, M.D.  ASSISTANT:  Romualdo Bolk, M.D.  PROCEDURE:  A loop electrosurgical excision, colposcopy, endocervical curettage.  ANESTHESIA:  LMA, local with 1% lidocaine with epinephrine 1:200,000.  IV FLUIDS:  1000 mL Ringer's lactate.  EBL:  5 mL, urine output 25 mL.  COMPLICATIONS:  None.  INDICATIONS FOR THE PROCEDURE:  The patient is a 74 year old gravida 0, para 0, African-American female, who presents with a history of normal Pap and positive high risk HPV.  The patient had a recent subtyping of a high risk HPV which was positive for type 18 and 45.  Colposcopy in the office was unsatisfactory.  The patient had a cervical polyp which was removed and was benign.  She had a random biopsy which showed cervicitis.  She was noted to have vaginal atrophy.  A plan is now made to proceed with a colposcopy with LEEP procedure in the operating room setting due to the patient's body habitus and difficulty in visualization of the cervix.  Risks, benefits, and alternatives were reviewed with the patient who wishes to proceed.  FINDINGS:  Colposcopy was unsatisfactory.  The patient had several folds of her cervix at the 12 o'clock position.  There was evidence of acetowhite thickening at the 3 and 9 o'clock positions.  SPECIMENS:  A LEEP specimen was sent separately from the endocervical curettage.   The LEEP specimen contained two cork boards.  The exocervix specimen was marked with a black pen at 12 o'clock, a green pen at 6 o'clock, and a blue pen in the 9 o'clock position.  At the 9 o'clock position, the specimen did not have orientation.  The endocervical button was sent in the same specimen cup and a red pen marked the 12 o'clock position of the endocervical button.  The second specimen today was curettage of the endocervix, and this was very minimal sample.  DESCRIPTION OF PROCEDURE:  The patient was re-identified in the preoperative holding area.  She did receive TED hose and PAS stockings for DVT prophylaxis.  In the operating room, the patient received an LMA anesthetic.  She was placed in the dorsal lithotomy position with padded stirrups.  An insulated speculum with vaginal sidewall retractors was placed inside the vagina.  A 5% acetic acid was used to perform the colposcopy.  The findings are as noted above.  The colposcopy was unsatisfactory.  The cervix was then stained with Lugol's solution.  A large treatment area was identified circumferentially around the cervix.  The cervix was then injected locally with 1% lidocaine with epinephrine 1:200,000.  A LEEP was performed initially with  a setting of 50 watts of cutting power.  As the loop was brought across the cervix starting inferiorly at the 9 o'clock position. There was a lot of dragging of the loop through the tissue.  A small piece was obtained and set aside as the 9'o clock position on the exocervix.  A new loop was obtained and the power setting was increased to 60 watts and the inferior pass along the cervix was performed without difficulty.  The specimen was set aside.  The superior pass of the LEEP procedure was performed next again with a cutting setting of 60 watts.  The endocervical button was removed at this time again with a similar energy setting.  All specimens were oriented later on cork boards. Please  refer to the findings above.  The endocervix was curetted after a uterine sound was used to probe the cervical opening.  Very minimal curettings were obtained with a Kevorkian curette and these were sent to pathology separate from the LEEP specimen.  At this time, the coagulation ball was used to coagulate the perimeter of the LEEP bed.  Monsel's was then used to paint across the entire LEEP bed and hemostasis was excellent.  At this time, all vaginal instruments were removed.  Hemostasis was good.  The patient was awakened and escorted to the recovery room in stable condition.  There were no complications to the procedure.  All needle, instrument, and sponge counts were correct.     Randye LoboBrook E. Silva, M.D.     BES/MEDQ  D:  05/27/2015  T:  05/28/2015  Job:  [id number]  cc:   Lavonda JumboEve A. Knapp, M.D. Fax: [phone]

## 2015-05-30 NOTE — Telephone Encounter (Signed)
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  Margins are negative for dysplasia. Her ECC was negative.   Please have her keep her 4 week post op visit.  I am recommending a pap and HR HPV testing in one year.  Please enter recall - 08.   Cc  - Dr. Joselyn ArrowEve Knapp

## 2015-05-30 NOTE — Telephone Encounter (Signed)
Call to patient and she is given results from Dr. Edward JollySilva.  Patient verbalized understanding of results.  She will keep post op appointment as scheduled. Denies complaints.  08 recall entered.  Routing to provider for final review. Patient agreeable to disposition. Will close encounter.

## 2015-06-25 ENCOUNTER — Ambulatory Visit: Payer: Self-pay | Admitting: Obstetrics and Gynecology

## 2015-06-26 DIAGNOSIS — I129 Hypertensive chronic kidney disease with stage 1 through stage 4 chronic kidney disease, or unspecified chronic kidney disease: Secondary | ICD-10-CM | POA: Diagnosis not present

## 2015-06-26 DIAGNOSIS — N183 Chronic kidney disease, stage 3 (moderate): Secondary | ICD-10-CM | POA: Diagnosis not present

## 2015-06-26 DIAGNOSIS — I10 Essential (primary) hypertension: Secondary | ICD-10-CM | POA: Diagnosis not present

## 2015-06-27 ENCOUNTER — Encounter: Payer: Self-pay | Admitting: Obstetrics and Gynecology

## 2015-06-27 ENCOUNTER — Ambulatory Visit (INDEPENDENT_AMBULATORY_CARE_PROVIDER_SITE_OTHER): Payer: Commercial Managed Care - HMO | Admitting: Obstetrics and Gynecology

## 2015-06-27 VITALS — BP 150/72 | HR 74 | Resp 20 | Ht 61.75 in | Wt 273.6 lb

## 2015-06-27 DIAGNOSIS — R896 Abnormal cytological findings in specimens from other organs, systems and tissues: Secondary | ICD-10-CM | POA: Diagnosis not present

## 2015-06-27 DIAGNOSIS — IMO0002 Reserved for concepts with insufficient information to code with codable children: Secondary | ICD-10-CM

## 2015-06-27 DIAGNOSIS — Z9889 Other specified postprocedural states: Secondary | ICD-10-CM

## 2015-06-27 NOTE — Progress Notes (Signed)
Patient ID: Kerri Richardson, female   DOB: Sep 20, 1940, 75 y.o.   MRN: [id number] GYNECOLOGY  VISIT   HPI: 75 y.o.   Single  African American  female   [place] with Patient's last menstrual period was 06/14/1998 (approximate).   here for 4 week post LOOP ELECTROSURGICAL EXCISION PROCEDURE (LEEP) (N/A Vagina )   COLPOSCOPY (N/A Vagina )      1. Cervix, LEEP - MILD SQUAMOUS DYSPLASIA (CIN-I), FOCAL. - THE SURGICAL MARGINS APPEAR NEGATIVE SQUAMOUS DYSPLASIA. 2. Endocervix, curettage - SCANTY BENIGN ENDOCERVICAL MUCOSA. - ATROPHIC APPEARING ENDOMETRIUM. - THERE IS NO EVIDENCE OF MALIGNANCY. Enid Cutter MD Pathologist, Electronic Signature (Case signed 05/29/2015) Specimen Gross and Clinical Information Specimen(s) Obtained: 1. Cervix, LEEP 2. Endocervix, curettage Specimen Clinical    GYNECOLOGIC HISTORY: Patient's last menstrual period was 06/14/1998 (approximate). Contraception:Postmenopausal Menopausal hormone therapy:  None. Last mammogram: 03-04-15 Density Cat.A/Neg/BiRads1:[facility] Last pap smear: 02-24-15 Neg:Pos HR HPV and Pos 18/45;colposcopy 04-25-15 unsatisfactory        OB History    Gravida Para Term Preterm AB TAB SAB Ectopic Multiple Living   '0 0 0 0 0 0 0 0 0 0 '         Patient Active Problem List   Diagnosis Date Noted  . Morbid obesity with body mass index of 45.0-49.9 in adult [facility]) 06/10/2014  . Diabetes mellitus with stage 3 chronic kidney disease ([place]) 03/25/2014  . Hypertensive nephropathy 03/25/2014  . Type 2 diabetes, controlled, with retinopathy ([place]) 12/10/2013  . Peripheral edema 12/22/2012  . Diabetic retinopathy ([place]) 08/23/2012  . Mixed hyperlipidemia 05/03/2011  . Type II or unspecified type diabetes mellitus without mention of complication, not stated as uncontrolled 02/01/2011  . Pure hypercholesterolemia 02/01/2011  . Essential hypertension, benign 02/01/2011  . CKD (chronic kidney disease), stage III 02/01/2011    Past  Medical History  Diagnosis Date  . Type II or unspecified type diabetes mellitus without mention of complication, not stated as uncontrolled   . Pure hypercholesterolemia   . Essential hypertension, benign   . Glaucoma   . Diabetic retinopathy   . CKD (chronic kidney disease) stage 3, GFR 30-59 ml/min     Dr. [name]  . DM retinopathy ([place]) 08/17/12    early  . Gout   . Arthritis     Past Surgical History  Procedure Laterality Date  . Cataract extraction, bilateral  2012    Dr. [name]  . Colonoscopy  3/05, 11/2013    due again 2020  . Tonsillectomy  age 48  . Brain surgery  1969    Burr holes in skull to alleviate pressure on the brain (no shunt)  . Leep N/A 05/27/2015    Procedure: LOOP ELECTROSURGICAL EXCISION PROCEDURE (LEEP);  Surgeon: [name], MD;  Location: [place] [facility];  Service: Gynecology;  Laterality: N/A;  . Colposcopy N/A 05/27/2015    Procedure: COLPOSCOPY;  Surgeon: [name], MD;  Location: [place] [facility];  Service: Gynecology;  Laterality: N/A;    Current Outpatient Prescriptions  Medication Sig Dispense Refill  . acetaminophen (TYLENOL) 500 MG tablet Take 1,000 mg by mouth every 6 (six) hours as needed for mild pain.    Marland Kitchen amLODipine (NORVASC) 10 MG tablet Take 1 tablet (10 mg total) by mouth daily. 90 tablet 1  . aspirin 81 MG tablet Take 81 mg by mouth daily.      Marland Kitchen atorvastatin (LIPITOR) 20 MG tablet Take 20 mg by mouth daily.    Marland Kitchen  Blood Glucose Monitoring Suppl (ACCU-CHEK NANO SMARTVIEW) W/DEVICE KIT USE AS DIRECTED (Patient taking differently: test once daily) 1 kit 0  . Cholecalciferol (VITAMIN D) 1000 UNITS capsule Take 1,000 Units by mouth daily.      . colchicine 0.6 MG tablet Take 1-2 tablets once daily as needed for gout (Patient taking differently: Take 0.6-1.2 mg by mouth daily as needed (For gout.). Take 1-2 tablets once daily as needed for gout) 30 tablet 0  . enalapril (VASOTEC) 10 MG tablet Take 10 mg by mouth daily.    . furosemide  (LASIX) 40 MG tablet Take 40 mg by mouth daily.     . insulin glargine (LANTUS) 100 UNIT/ML injection Inject 25 Units into the skin at bedtime.    Marland Kitchen latanoprost (XALATAN) 0.005 % ophthalmic solution Place 1 drop into both eyes at bedtime.      . metFORMIN (GLUCOPHAGE) 850 MG tablet Take 1 tablet (850 mg total) by mouth 2 (two) times daily with a meal. 180 tablet 1  . pioglitazone (ACTOS) 15 MG tablet Take 1 tablet (15 mg total) by mouth 2 (two) times daily. 180 tablet 1  . vitamin B-12 (CYANOCOBALAMIN) 100 MCG tablet Take 100 mcg by mouth daily.       No current facility-administered medications for this visit.     ALLERGIES: Review of patient's allergies indicates no known allergies.  Family History  Problem Relation Age of Onset  . Hypertension Mother   . Cancer Mother     lung (nonsmoker)  . Stroke Father   . Hypertension Father   . Diabetes Father   . Hypertension Sister   . Diabetes Sister   . Heart disease Sister   . Hypertension Sister   . Breast cancer Maternal Aunt 62    23 of old age  . Diabetes Paternal Grandmother     Social History   Social History  . Marital Status: Single    Spouse Name: N/A  . Number of Children: N/A  . Years of Education: N/A   Occupational History  . Not on file.   Social History Main Topics  . Smoking status: Former Smoker    Quit date: 06/14/2000  . Smokeless tobacco: Never Used  . Alcohol Use: No  . Drug Use: No  . Sexual Activity: No   Other Topics Concern  . Not on file   Social History Narrative   Worked at [employer], retired 12/2013. Lives alone.  She has a sister in [place].  No children    ROS:  Pertinent items are noted in HPI.  PHYSICAL EXAMINATION:    BP 150/72 mmHg  Pulse 74  Resp 20  Ht 5' 1.75" (1.568 m)  Wt 273 lb 9.6 oz (124.104 kg)  BMI 50.48 kg/m2  LMP 06/14/1998 (Approximate)    General appearance: alert, cooperative and appears stated age    Pelvic: External genitalia:  no lesions               Urethra:  normal appearing urethra with no masses, tenderness or lesions              Bartholins and Skenes: normal                 Vagina: normal appearing vagina with normal color and discharge, no lesions              Cervix: consistent with LEEP.  Healing well.  Bimanual Exam:  Uterus:  normal size, contour, position, consistency, mobility, non-tender              Adnexa: normal adnexa and no mass, fullness, tenderness  Bimanual exam limited by body habitus.              Chaperone was present for exam.  ASSESSMENT  Status post LEEP for positive HR HPV and unsatisfactory colposcopy.  Final pathology LGSIL.  PLAN  Counseled regarding LEEP procedure, cervical dysplasia, HPV, and LGSIL.  I recommend yearly cotesting for at least 2 years.  Patient will follow up here for this care and continue her routine care with Dr. Tomi Bamberger.  Results have been forwarded to Dr. Tomi Bamberger.   An After Visit Summary was printed and given to the patient.  __15____ minutes face to face time of which over 50% was spent in counseling.

## 2015-07-23 ENCOUNTER — Other Ambulatory Visit: Payer: Self-pay | Admitting: Family Medicine

## 2015-07-30 ENCOUNTER — Other Ambulatory Visit: Payer: Self-pay | Admitting: Family Medicine

## 2015-08-05 ENCOUNTER — Other Ambulatory Visit: Payer: Self-pay | Admitting: Family Medicine

## 2015-09-24 ENCOUNTER — Telehealth: Payer: Self-pay | Admitting: *Deleted

## 2015-09-24 ENCOUNTER — Encounter: Payer: Self-pay | Admitting: Family Medicine

## 2015-09-24 ENCOUNTER — Ambulatory Visit (INDEPENDENT_AMBULATORY_CARE_PROVIDER_SITE_OTHER): Payer: Commercial Managed Care - HMO | Admitting: Family Medicine

## 2015-09-24 VITALS — BP 136/70 | HR 92 | Ht 62.0 in | Wt 278.0 lb

## 2015-09-24 DIAGNOSIS — Z6841 Body Mass Index (BMI) 40.0 and over, adult: Secondary | ICD-10-CM

## 2015-09-24 DIAGNOSIS — R7989 Other specified abnormal findings of blood chemistry: Secondary | ICD-10-CM

## 2015-09-24 DIAGNOSIS — Z5181 Encounter for therapeutic drug level monitoring: Secondary | ICD-10-CM | POA: Diagnosis not present

## 2015-09-24 DIAGNOSIS — M10071 Idiopathic gout, right ankle and foot: Secondary | ICD-10-CM

## 2015-09-24 DIAGNOSIS — E782 Mixed hyperlipidemia: Secondary | ICD-10-CM

## 2015-09-24 DIAGNOSIS — I10 Essential (primary) hypertension: Secondary | ICD-10-CM

## 2015-09-24 DIAGNOSIS — M109 Gout, unspecified: Secondary | ICD-10-CM | POA: Insufficient documentation

## 2015-09-24 DIAGNOSIS — N183 Chronic kidney disease, stage 3 unspecified: Secondary | ICD-10-CM

## 2015-09-24 DIAGNOSIS — M79671 Pain in right foot: Secondary | ICD-10-CM

## 2015-09-24 DIAGNOSIS — E119 Type 2 diabetes mellitus without complications: Secondary | ICD-10-CM | POA: Diagnosis not present

## 2015-09-24 DIAGNOSIS — E11319 Type 2 diabetes mellitus with unspecified diabetic retinopathy without macular edema: Secondary | ICD-10-CM

## 2015-09-24 DIAGNOSIS — Z794 Long term (current) use of insulin: Secondary | ICD-10-CM | POA: Diagnosis not present

## 2015-09-24 DIAGNOSIS — E790 Hyperuricemia without signs of inflammatory arthritis and tophaceous disease: Secondary | ICD-10-CM

## 2015-09-24 DIAGNOSIS — E1122 Type 2 diabetes mellitus with diabetic chronic kidney disease: Secondary | ICD-10-CM | POA: Diagnosis not present

## 2015-09-24 LAB — COMPREHENSIVE METABOLIC PANEL
ALBUMIN: 4 g/dL (ref 3.6–5.1)
ALK PHOS: 63 U/L (ref 33–130)
ALT: 11 U/L (ref 6–29)
AST: 12 U/L (ref 10–35)
BILIRUBIN TOTAL: 0.4 mg/dL (ref 0.2–1.2)
BUN: 36 mg/dL — AB (ref 7–25)
CO2: 25 mmol/L (ref 20–31)
CREATININE: 1.35 mg/dL — AB (ref 0.60–0.93)
Calcium: 8.9 mg/dL (ref 8.6–10.4)
Chloride: 105 mmol/L (ref 98–110)
Glucose, Bld: 93 mg/dL (ref 65–99)
Potassium: 4.9 mmol/L (ref 3.5–5.3)
SODIUM: 140 mmol/L (ref 135–146)
TOTAL PROTEIN: 6.7 g/dL (ref 6.1–8.1)

## 2015-09-24 LAB — POCT GLYCOSYLATED HEMOGLOBIN (HGB A1C): Hemoglobin A1C: 6.3

## 2015-09-24 LAB — URIC ACID: URIC ACID, SERUM: 9.9 mg/dL — AB (ref 2.4–7.0)

## 2015-09-24 MED ORDER — INSULIN GLARGINE 100 UNIT/ML ~~LOC~~ SOLN: 25.0000 [IU] | Freq: Every day | SUBCUTANEOUS | Status: DC

## 2015-09-24 MED ORDER — PIOGLITAZONE HCL 30 MG PO TABS: 30.0000 mg | ORAL_TABLET | Freq: Every day | ORAL | Status: DC

## 2015-09-24 MED ORDER — ATORVASTATIN CALCIUM 20 MG PO TABS: 20.0000 mg | ORAL_TABLET | Freq: Every day | ORAL | Status: DC

## 2015-09-24 MED ORDER — METFORMIN HCL 850 MG PO TABS: 850.0000 mg | ORAL_TABLET | Freq: Two times a day (BID) | ORAL | Status: DC

## 2015-09-24 MED ORDER — "INSULIN SYRINGE-NEEDLE U-100 31G X 5/16"" 0.5 ML MISC": 1.0000 | Freq: Every day | Status: DC

## 2015-09-24 NOTE — Telephone Encounter (Signed)
patient states that she forgot to ask you for a handicapped placard when she was here-she needs it for when she has gout attacks, says it really hard to walk at that time.

## 2015-09-24 NOTE — Telephone Encounter (Signed)
Patient advised.

## 2015-09-24 NOTE — Patient Instructions (Signed)
  Please be sure to get at least 150 minutes of aerobic exercise each week (this can be at the Y, or doing 10-15 minutes at home 2-3 times daily). Weight bearing exercise is recommended at least 2x/week (your weights during water aerobics count, or use the ones you have at home). If you don't like your current [facility] (for the bike, when you can't get in the pool), consider trying out other Y's, or go different times of the day.  You are taking pioglitazone 15mg  twice daily currently, as that was what was in the ActoplusMet combination you used to take.  There isn't any reason you need to take this medication twice daily when separate (not combination).  I'm changing you to a 30mg  tablet for your next prescription, and you will take this just ONCE DAILY (higher dose)--overall this isn't changing anything, other than fewer pills to take.  Please go back to following [employer] (even if you aren't going/paying)--you know what to do, as far as keeping track of points, portion control, good food choices.  Weigh yourself weekly at home for motivation (instead of paying to go there).  I know you can't afford to go, but to follow what you already know how to do is FREE!  As far as the colchicine prescription that is on file with your insurance--if they say it isn't covered, have them initiate prior authorization.  They usually fax us something, and Vernona RiegerLaura in our office helps take care of it.  You shouldn't use NSAIDs for inflammation/pain, so colchicine is the best option.

## 2015-09-24 NOTE — Telephone Encounter (Signed)
These are not given for acute conditions--she had 2 flares within the course of a year.  This doesn't meet the criteria for a placard.

## 2015-09-24 NOTE — Progress Notes (Signed)
Chief Complaint  Patient presents with  . Diabetes    fasting med check, no concerns. Did take some cholchicine last week due to gout flare-she notices that her sugars are slightly elevated after taking this medication-is this normal?   Diabetes follow-up: She is checking her sugars just once daily, running 109-120 each morning. Sugars were a little higher last week, up to 155, while taking colchicine for a gout flare . Denies pain currently. Denies hypoglycemia. Denies polydipsia and polyuria (voids frequently at night). Last eye exam was 09/2014, scheduled for 10/14/15. Patient follows a low sugar diet and checks feet regularly.   Hyperlipidemia follow-up: Patient is reportedly following a low-fat, low cholesterol diet. Compliant with medications and denies medication side effects.   Obesity: She used to go to water aerobics 3x/week, but hasn't gone since December, related to her GYN procedure.  She plans to restart 5/1.  [facility], not even for the bike. She found that the Y was "clicky", and bikes weren't available when she likes to go.  She saw nutritionist in October 2016. She was told to follow what she knew from Weight Watchers, and didn't need to schedule f/u.  She has always lost weight with [employer], but regains it when she stops going.  Stops due to cost.  Hypertension follow-up: Blood pressure is not checked elsewhere. Denies dizziness, headaches, chest pain. Denies side effects of medications.  H/o toe pain: Some degenerative changes noted on x-ray in 02/2015.Uric acid level was elevated at 10.8 at that time.  She was given a trial of colchicine to use prn for flares, suspecting gout. She had pain last week in the right heel (not at the toe). She took colchicine for about 3 days (only had a few days left, couldn't afford the refill). Pain has improved.  Denies change in activity. She ate a hamburger. No other dietary changes.  This was the first flare of pain since  September.  CKD--sees Dr. [name] yearly.She thinks her last visit was in January. No changes in medications were made.  S/p LEEP for +HR HPV, final pathology LGSIL. She will f/u with Dr. Quincy Simmonds for yearly paps (with cotesting) for at least 2 years)  PMH, [place], [place] reviewed  Outpatient Encounter Prescriptions as of 09/24/2015  Medication Sig  . amLODipine (NORVASC) 10 MG tablet Take 1 tablet (10 mg total) by mouth daily.  Marland Kitchen aspirin 81 MG tablet Take 81 mg by mouth daily.    Marland Kitchen atorvastatin (LIPITOR) 20 MG tablet Take 1 tablet (20 mg total) by mouth daily.  . Blood Glucose Monitoring Suppl (ACCU-CHEK NANO SMARTVIEW) W/DEVICE KIT USE AS DIRECTED (Patient taking differently: test once daily)  . Cholecalciferol (VITAMIN D) 1000 UNITS capsule Take 1,000 Units by mouth daily.    . enalapril (VASOTEC) 10 MG tablet Take 10 mg by mouth daily.  . furosemide (LASIX) 40 MG tablet Take 40 mg by mouth daily.   . insulin glargine (LANTUS) 100 UNIT/ML injection Inject 0.25 mLs (25 Units total) into the skin at bedtime.  Marland Kitchen latanoprost (XALATAN) 0.005 % ophthalmic solution Place 1 drop into both eyes at bedtime.    . metFORMIN (GLUCOPHAGE) 850 MG tablet Take 1 tablet (850 mg total) by mouth 2 (two) times daily with a meal.  . pioglitazone (ACTOS) 30 MG tablet Take 1 tablet (30 mg total) by mouth daily.  . vitamin B-12 (CYANOCOBALAMIN) 100 MCG tablet Take 100 mcg by mouth daily.    . [DISCONTINUED]  atorvastatin (LIPITOR) 20 MG tablet TAKE 1 TABLET EVERY DAY  . [DISCONTINUED] insulin glargine (LANTUS) 100 UNIT/ML injection Inject 25 Units into the skin at bedtime.  . [DISCONTINUED] metFORMIN (GLUCOPHAGE) 850 MG tablet TAKE 1 TABLET TWICE DAILY WITH MEALS  . [DISCONTINUED] pioglitazone (ACTOS) 15 MG tablet TAKE 1 TABLET TWICE DAILY  . acetaminophen (TYLENOL) 500 MG tablet Take 1,000 mg by mouth every 6 (six) hours as needed for mild pain. Reported on 09/24/2015  . colchicine 0.6 MG tablet TAKE ONE TO TWO TABLETS BY  MOUTH ONCE DAILY AS NEEDED FOR  GOUT (Patient not taking: Reported on 09/24/2015)  . Insulin Syringe-Needle U-100 (RELION INSULIN SYR 0.5ML/31G) 31G X 5/16" 0.5 ML MISC 1 each by Does not apply route daily.   No facility-administered encounter medications on file as of 09/24/2015.   No Known Allergies  ROS: no fever, chills, URI symptoms, headaches, dizziness, chest pain, shortness of breath, GI or GU symptoms. Joint pain--pain in heel last week, resolved. No depression oro there concerns.  PHYSICAL EXAM: BP 136/70 mmHg  Pulse 92  Ht _0  (1.575 m)  Wt 278 lb (126.1 kg)  BMI 50.83 kg/m2  LMP 06/14/1998 (Approximate)  Pleasant, obese female in no distress  HEENT: PERRL EOMI, conjunctiva and sclera clear Neck: no lymphadenopathy, thyromegaly or mass, no carotid bruit Heart: regular rate and rhythm without murmur  Lungs: clear bilaterally  Abdomen: obese, soft, nontender, no mass  Extremities: no edema, 2+ pulses,  normal monofilament testing. No swelling, erythema or or warmth, nontender. Neuro: alert and oriented. Cranial nerves grossly intact. 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 Encouraged her to follow [employer] diet, even if not paying/going  Right foot pain - recent flare of heel pain.  elevated uric acid, so suspect h/o gout (prior toe pain). resolved. recheck uric acid  Elevated uric acid in blood - Plan: Uric acid  Acute gout of right foot, unspecified cause - Plan: Uric acid  Medication monitoring encounter - Plan: Comprehensive metabolic panel   c-met, uric acid, TSH (lipids not needed, at goal in September, check yearly)  Send copies of labs to Dr. [name]  F/u 6 months for AWV/med check (no GYN exam)   Please be sure to get at least 150 minutes of aerobic exercise each week (this can be at the Y, or doing 10-15 minutes at home 2-3 times daily). Weight bearing exercise is recommended at least 2x/week (your weights during water aerobics count, or use the ones you have at home). If you don't like your current [facility] (for the bike, when you can't get in the  pool), consider trying out other Y's, or go different times of the day.  You are taking pioglitazone 86m twice daily currently, as that was what was in the ActoplusMet combination you used to take.  There isn't any reason you need to take this medication twice daily when separate (not combination).  I'm changing you to a 361mtablet for your next prescription, and you will take this just ONCE DAILY (higher dose)--overall this isn't changing anything, other than fewer pills to take.  Please go back to following Weight Watchers (even if you aren't going/paying)--you know what to do, as far as keeping track of points, portion control, good food choices.  Weigh yourself weekly at home for motivation (instead of paying to go there).  I know you can't afford to go, but to follow what  you already know how to do is FREE!  As far as the colchicine prescription that is on file with your insurance--if they say it isn't covered, have them initiate prior authorization.  They usually [place], and LaMickel Baasn our office helps take care of it.  You shouldn't use NSAIDs for inflammation/pain, so colchicine is the best option.

## 2015-09-25 ENCOUNTER — Other Ambulatory Visit: Payer: Self-pay | Admitting: *Deleted

## 2015-09-25 DIAGNOSIS — M109 Gout, unspecified: Secondary | ICD-10-CM

## 2015-09-25 LAB — TSH: TSH: 4.15 m[IU]/L

## 2015-09-25 MED ORDER — ALLOPURINOL 100 MG PO TABS: 100.0000 mg | ORAL_TABLET | Freq: Every day | ORAL | Status: DC

## 2015-10-01 ENCOUNTER — Other Ambulatory Visit: Payer: Self-pay | Admitting: Family Medicine

## 2015-10-02 ENCOUNTER — Encounter: Payer: Self-pay | Admitting: *Deleted

## 2015-10-09 ENCOUNTER — Other Ambulatory Visit: Payer: Self-pay | Admitting: Family Medicine

## 2015-10-27 ENCOUNTER — Other Ambulatory Visit: Payer: Self-pay | Admitting: Family Medicine

## 2015-11-26 ENCOUNTER — Other Ambulatory Visit: Payer: Self-pay | Admitting: Family Medicine

## 2015-11-26 DIAGNOSIS — Z961 Presence of intraocular lens: Secondary | ICD-10-CM | POA: Diagnosis not present

## 2015-11-26 DIAGNOSIS — H35352 Cystoid macular degeneration, left eye: Secondary | ICD-10-CM | POA: Diagnosis not present

## 2015-11-26 DIAGNOSIS — H353121 Nonexudative age-related macular degeneration, left eye, early dry stage: Secondary | ICD-10-CM | POA: Diagnosis not present

## 2015-11-26 DIAGNOSIS — E113312 Type 2 diabetes mellitus with moderate nonproliferative diabetic retinopathy with macular edema, left eye: Secondary | ICD-10-CM | POA: Diagnosis not present

## 2015-11-26 DIAGNOSIS — E113512 Type 2 diabetes mellitus with proliferative diabetic retinopathy with macular edema, left eye: Secondary | ICD-10-CM | POA: Diagnosis not present

## 2015-11-26 DIAGNOSIS — H401131 Primary open-angle glaucoma, bilateral, mild stage: Secondary | ICD-10-CM | POA: Diagnosis not present

## 2015-11-26 DIAGNOSIS — H04123 Dry eye syndrome of bilateral lacrimal glands: Secondary | ICD-10-CM | POA: Diagnosis not present

## 2015-11-26 DIAGNOSIS — E113391 Type 2 diabetes mellitus with moderate nonproliferative diabetic retinopathy without macular edema, right eye: Secondary | ICD-10-CM | POA: Diagnosis not present

## 2015-11-26 LAB — HM DIABETES EYE EXAM

## 2015-12-01 ENCOUNTER — Encounter: Payer: Self-pay | Admitting: Family Medicine

## 2015-12-03 ENCOUNTER — Other Ambulatory Visit: Payer: Commercial Managed Care - HMO

## 2015-12-03 DIAGNOSIS — M109 Gout, unspecified: Secondary | ICD-10-CM | POA: Diagnosis not present

## 2015-12-03 LAB — URIC ACID: URIC ACID, SERUM: 6.9 mg/dL (ref 2.5–7.0)

## 2016-01-29 ENCOUNTER — Other Ambulatory Visit: Payer: Self-pay | Admitting: Family Medicine

## 2016-02-18 ENCOUNTER — Other Ambulatory Visit: Payer: Self-pay | Admitting: Family Medicine

## 2016-02-18 DIAGNOSIS — E782 Mixed hyperlipidemia: Secondary | ICD-10-CM

## 2016-03-16 ENCOUNTER — Other Ambulatory Visit: Payer: Self-pay | Admitting: Family Medicine

## 2016-03-29 ENCOUNTER — Ambulatory Visit (INDEPENDENT_AMBULATORY_CARE_PROVIDER_SITE_OTHER): Payer: Commercial Managed Care - HMO | Admitting: Family Medicine

## 2016-03-29 ENCOUNTER — Encounter: Payer: Self-pay | Admitting: Family Medicine

## 2016-03-29 VITALS — BP 130/70 | HR 80 | Ht 62.0 in | Wt 282.6 lb

## 2016-03-29 DIAGNOSIS — E11319 Type 2 diabetes mellitus with unspecified diabetic retinopathy without macular edema: Secondary | ICD-10-CM

## 2016-03-29 DIAGNOSIS — M109 Gout, unspecified: Secondary | ICD-10-CM | POA: Diagnosis not present

## 2016-03-29 DIAGNOSIS — Z6841 Body Mass Index (BMI) 40.0 and over, adult: Secondary | ICD-10-CM | POA: Diagnosis not present

## 2016-03-29 DIAGNOSIS — Z794 Long term (current) use of insulin: Secondary | ICD-10-CM | POA: Diagnosis not present

## 2016-03-29 DIAGNOSIS — Z23 Encounter for immunization: Secondary | ICD-10-CM | POA: Diagnosis not present

## 2016-03-29 DIAGNOSIS — E7800 Pure hypercholesterolemia, unspecified: Secondary | ICD-10-CM | POA: Diagnosis not present

## 2016-03-29 DIAGNOSIS — Z0000 Encounter for general adult medical examination without abnormal findings: Secondary | ICD-10-CM

## 2016-03-29 DIAGNOSIS — Z5181 Encounter for therapeutic drug level monitoring: Secondary | ICD-10-CM

## 2016-03-29 DIAGNOSIS — N183 Chronic kidney disease, stage 3 (moderate): Secondary | ICD-10-CM

## 2016-03-29 DIAGNOSIS — E1122 Type 2 diabetes mellitus with diabetic chronic kidney disease: Secondary | ICD-10-CM | POA: Diagnosis not present

## 2016-03-29 DIAGNOSIS — I10 Essential (primary) hypertension: Secondary | ICD-10-CM | POA: Diagnosis not present

## 2016-03-29 DIAGNOSIS — E782 Mixed hyperlipidemia: Secondary | ICD-10-CM | POA: Diagnosis not present

## 2016-03-29 LAB — CBC WITH DIFFERENTIAL/PLATELET
BASOS ABS: 62 {cells}/uL (ref 0–200)
Basophils Relative: 1 %
EOS PCT: 5 %
Eosinophils Absolute: 310 cells/uL (ref 15–500)
HEMATOCRIT: 37.6 % (ref 35.0–45.0)
HEMOGLOBIN: 12.1 g/dL (ref 11.7–15.5)
LYMPHS PCT: 28 %
Lymphs Abs: 1736 cells/uL (ref 850–3900)
MCH: 27.8 pg (ref 27.0–33.0)
MCHC: 32.2 g/dL (ref 32.0–36.0)
MCV: 86.4 fL (ref 80.0–100.0)
MPV: 11 fL (ref 7.5–12.5)
Monocytes Absolute: 558 cells/uL (ref 200–950)
Monocytes Relative: 9 %
NEUTROS PCT: 57 %
Neutro Abs: 3534 cells/uL (ref 1500–7800)
Platelets: 261 10*3/uL (ref 140–400)
RBC: 4.35 MIL/uL (ref 3.80–5.10)
RDW: 15 % (ref 11.0–15.0)
WBC: 6.2 10*3/uL (ref 4.0–10.5)

## 2016-03-29 LAB — COMPREHENSIVE METABOLIC PANEL
ALK PHOS: 69 U/L (ref 33–130)
ALT: 10 U/L (ref 6–29)
AST: 13 U/L (ref 10–35)
Albumin: 4 g/dL (ref 3.6–5.1)
BUN: 51 mg/dL — AB (ref 7–25)
CO2: 23 mmol/L (ref 20–31)
CREATININE: 1.82 mg/dL — AB (ref 0.60–0.93)
Calcium: 8.8 mg/dL (ref 8.6–10.4)
Chloride: 104 mmol/L (ref 98–110)
Glucose, Bld: 86 mg/dL (ref 65–99)
Potassium: 4.6 mmol/L (ref 3.5–5.3)
SODIUM: 138 mmol/L (ref 135–146)
TOTAL PROTEIN: 7 g/dL (ref 6.1–8.1)
Total Bilirubin: 0.4 mg/dL (ref 0.2–1.2)

## 2016-03-29 LAB — POCT GLYCOSYLATED HEMOGLOBIN (HGB A1C): HEMOGLOBIN A1C: 5.9

## 2016-03-29 LAB — LIPID PANEL
CHOLESTEROL: 192 mg/dL (ref 125–200)
HDL: 45 mg/dL — ABNORMAL LOW (ref >=46)
LDL Cholesterol: 122 mg/dL (ref <130)
Total CHOL/HDL Ratio: 4.3 Ratio (ref <=5.0)
Triglycerides: 123 mg/dL (ref <150)
VLDL: 25 mg/dL (ref <30)

## 2016-03-29 LAB — URIC ACID: URIC ACID, SERUM: 7.3 mg/dL — AB (ref 2.5–7.0)

## 2016-03-29 MED ORDER — ENALAPRIL MALEATE 10 MG PO TABS: 10.0000 mg | ORAL_TABLET | Freq: Every day | ORAL | 1 refills | Status: DC

## 2016-03-29 MED ORDER — PIOGLITAZONE HCL 30 MG PO TABS: 30.0000 mg | ORAL_TABLET | Freq: Every day | ORAL | 1 refills | Status: DC

## 2016-03-29 MED ORDER — METFORMIN HCL 850 MG PO TABS: 850.0000 mg | ORAL_TABLET | Freq: Two times a day (BID) | ORAL | 1 refills | Status: DC

## 2016-03-29 NOTE — Progress Notes (Signed)
Chief Complaint  Patient presents with  . Kerri Richardson    nonfasting (blood is in lab) AWV visit no pap (sees Kerri Richardson). No concerns.    Kerri Richardson is a 75 Kerri.o. female who presents for annual Richardson visit and follow-up on chronic medical conditions.  She has the following concerns:  Diabetes follow-up: Her glucometer broke 2 months ago, so hasn't been checking sugars. Denies hypoglycemia. Denies polydipsia and polyuria (voids frequently at night). UTD on eye exams, goes 2x/week (thinks last was 10/14/15). Patient follows a low sugar diet and checks feet regularly without concerns. She is compliant with her oral medications and her insulin (Lantus 25 U qHS).  Hyperlipidemia follow-up: Patient is reportedly following a low-fat, low cholesterol diet. Compliant with medications and denies medication side effects.   Obesity: She used to go to water aerobics 3x/week, but hasn't gone since December, after her GYN procedure. She never went back, felt the Kerri to be too "clicky". Plans to start Kerri Richardson at [facility] soon.  She saw nutritionist in October 2016. She was told to follow what she knew from Weight Watchers, and didn't need to schedule f/u.  She has always lost weight with Kerri Richardson, but regains it when she stops going.  Stops due to cost. She admits that she never got back into the routine of doing Kerri Richardson (tracking points) "because I just don't eat that much".  Hypertension follow-up: Blood pressure is not checked elsewhere. Denies dizziness, headaches, chest pain. Denies side effects of medications.  H/o toe pain/gout: Some degenerative changes noted on x-ray in 02/2015.Uric acid level was elevated at 10.8 at that time, felt to likely have a component of gout.  She was given a trial of colchicine to use prn for flares, suspecting gout, which was helpful. Level when not flaring (09/2015) was 9.9, so she was started on allopurinol 143m daily. Uric acid 6.9 in 12/03/15 on 1019mallopurinol.  She sometimes gets a very slight "funny feeling" in the right great toe.  Hasn't had pain like when she had gout flares.  CKD--sees Dr. PoFlorene Glenearly in January. No changes in medications were made.  S/p LEEP for +HR HPV, final pathology LGSIL in 05/2015 with Dr. SiQuincy SimmondsShe will f/u with Dr. SiQuincy Simmondsor yearly paps (with cotesting) for at least 2 years).  She is scheduled to see her in January.  Immunization History  Administered Date(s) Administered  . Influenza Split 05/05/2011, 03/23/2012  . Influenza, High Dose Seasonal PF 04/03/2014, 02/20/2015  . Pneumococcal Conjugate-13 12/10/2013  . Pneumococcal Polysaccharide-23 01/26/2010  . Tdap 10/19/2007  . 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 SiQuincy Simmondsast mammogram: 02/2015 Last colonoscopy: 11/26/13, +polyp, due again in 5 years Last DEXA: 01/2010--normal Ophtho: twice yearly, due again later this year (+ retinopathy) Dentist: has dentures, and just one tooth; last seen 02/2013 (got new dentures) Exercise: a little walking (previously did water aerobics 3x/week). [facility].  Other doctors caring for patient include: Ophtho: Kerri Richardson: Dr. PoFlorene GlenI: Dr. MaCollene MaresYN: Dr. SiQuincy Simmondsrtho: Dr. BeTonita CongDepression screen:  Negative (see epic questionnaire) Fall screen: negative Functional Status [place]:  Notable for some decrease in hearing x 6 months (people speak too quietly)  End of Life Discussion:  Patient does not have a living will and medical power of attorney. Plans to ask her Kerri Richardson, and his wife is  a Patent examiner.  Past Medical History:  Diagnosis Date  . Arthritis   . CKD (chronic kidney disease) stage 3, GFR 30-59 ml/min    Kerri Richardson  . Diabetic retinopathy   . DM retinopathy ([place]) 08/17/12   early  . Essential hypertension, benign   . Glaucoma   . Gout   . Pure hypercholesterolemia   . Type II or  unspecified type diabetes mellitus without mention of complication, not stated as uncontrolled     Past Surgical History:  Procedure Laterality Date  . BRAIN SURGERY  1969   Burr holes in skull to alleviate pressure on the brain (no shunt)  . CATARACT EXTRACTION, BILATERAL  2012   Kerri Richardson  . COLONOSCOPY  3/05, 11/2013   due again 2020  . COLPOSCOPY N/A 05/27/2015   Procedure: COLPOSCOPY;  Surgeon: Kerri Richardson;  Location: [facility];  Service: Gynecology;  Laterality: N/A;  . LEEP N/A 05/27/2015   Procedure: LOOP ELECTROSURGICAL EXCISION PROCEDURE (LEEP);  Surgeon: Kerri Richardson;  Location: [facility];  Service: Gynecology;  Laterality: N/A;  . TONSILLECTOMY  age 28    Social History   Social History  . Marital status: Single    Spouse name: N/A  . Number of children: N/A  . Years of education: N/A   Occupational History  . Not on file.   Social History Main Topics  . Smoking status: Former Smoker    Quit date: 06/14/2000  . Smokeless tobacco: Never Used  . Alcohol use No  . Drug use: No  . Sexual activity: No   Other Topics Concern  . Not on file   Social History Narrative   Worked at [employer], retired 12/2013. Lives alone.  She has a sister in [place].  No children. Her Kerri Richardson lives in [place]    Family History  Problem Relation Age of Onset  . Hypertension Mother   . Cancer Mother     lung (nonsmoker)  . Stroke Father   . Hypertension Father   . Diabetes Father   . Hypertension Sister   . Diabetes Sister   . Heart disease Sister   . Hypertension Sister   . Breast cancer Maternal Aunt 66    38 of old age  . Diabetes Paternal Grandmother     Outpatient Encounter Prescriptions as of 03/29/2016  Medication Sig  . allopurinol (ZYLOPRIM) 100 MG tablet TAKE 1 TABLET EVERY DAY  . amLODipine (NORVASC) 10 MG tablet Take 1 tablet (10 mg total) by mouth daily.  Marland Kitchen aspirin 81 MG tablet Take 81 mg by mouth daily.    Marland Kitchen atorvastatin (LIPITOR) 20  MG tablet TAKE 1 TABLET EVERY DAY  . Blood Glucose Monitoring Suppl (ACCU-CHEK NANO SMARTVIEW) W/DEVICE KIT USE AS DIRECTED (Patient taking differently: test once daily)  . Cholecalciferol (VITAMIN D) 1000 UNITS capsule Take 1,000 Units by mouth daily.    . enalapril (VASOTEC) 10 MG tablet TAKE 1 TABLET EVERY DAY  . furosemide (LASIX) 40 MG tablet Take 40 mg by mouth daily.   . insulin glargine (LANTUS) 100 UNIT/ML injection Inject 0.25 mLs (25 Units total) into the skin at bedtime.  . Insulin Syringe-Needle U-100 (RELION INSULIN SYR 0.5ML/31G) 31G X 5/16" 0.5 ML MISC 1 each by Does not apply route daily.  Marland Kitchen latanoprost (XALATAN) 0.005 % ophthalmic solution Place 1 drop into both eyes at bedtime.    . metFORMIN (GLUCOPHAGE) 850 MG tablet TAKE 1 TABLET TWICE DAILY  WITH MEALS  . pioglitazone (ACTOS) 30 MG tablet Take 1 tablet (30 mg total) by mouth daily.  . vitamin B-12 (CYANOCOBALAMIN) 100 MCG tablet Take 100 mcg by mouth daily.    Marland Kitchen acetaminophen (TYLENOL) 500 MG tablet Take 1,000 mg by mouth every 6 (six) hours as needed for mild pain. Reported on 09/24/2015  . colchicine 0.6 MG tablet TAKE ONE TO TWO TABLETS BY MOUTH ONCE DAILY AS NEEDED FOR  GOUT (Patient not taking: Reported on 03/29/2016)   No facility-administered encounter medications on file as of 03/29/2016.     No Known Allergies  ROS: The patient denies anorexia, fever, headaches, vision changes, ear pain, sore throat, breast concerns, chest pain, palpitations, dizziness, syncope, dyspnea on exertion, cough, swelling (only rarely), nausea, vomiting, diarrhea, constipation, abdominal pain, melena, hematochezia, indigestion/heartburn, hematuria, incontinence, dysuria, vaginal bleeding, discharge, odor or itch, genital lesions, joint pains (occasional right knee pain; no longer getting painful toe episodes since on the allopurinol--very mild); denies numbness, tingling, weakness, tremor, suspicious skin lesions, depression, anxiety,  abnormal bleeding/bruising (some bruising, on aspirin), or enlarged lymph nodes. She has gained 8# in the last year. Right knee pain has improved, only occasionally flares, and relieved by tylenol prn. Has noted some hearing loss--thinks people speak too quietly.     PHYSICAL EXAM:  BP 130/70 (BP Location: Left Arm, Patient Position: Sitting, Cuff Size: Normal)   Pulse 80   Ht '5\' 2"'  (1.575 m)   Wt 282 lb 9.6 oz (128.2 kg)   LMP 06/14/1998 (Approximate)   BMI 51.69 kg/m   General Appearance:   Alert, cooperative, no distress, appears stated age  Head:   Normocephalic, without obvious abnormality, atraumatic  Eyes:   PERRL, conjunctiva/corneas clear, EOM's intact, fundi not well visualized  Ears:   Normal TM's and external ear canals. No significant cerumen present (to contribute to decreased hearing)  Nose:  Nares normal, mucosa normal, no drainage or sinus tenderness  Throat:  Lips, mucosa, and tongue normal; +dentures.  One tooth remaining on right upper jaw.  Neck:  Supple, no lymphadenopathy; thyroid: no enlargement/tenderness/nodules; no carotid  bruit or JVD  Back:  Spine nontender, no curvature, ROM normal, no CVAtenderness  Lungs:   Clear to auscultation bilaterally without wheezes, rales orronchi; respirations unlabored  Chest Wall:   No tenderness or deformity  Heart:   Regular rate and rhythm, S1 and S2 normal, no murmur, rub or gallop  Breast Exam:   deferred to GYN  Abdomen:   Soft, obese, non-tender, nondistended, normoactive bowel sounds, no masses, no hepatosplenomegaly  Genitalia:   deferred to GYN  Rectal:   deferred to GYN  Extremities:  No clubbing, cyanosis. No edema. Normal sensation to light touch.  Pulses:  2+ and symmetric all extremities  Skin:  Skin color, texture, turgor normal, no rashes or lesions  Lymph nodes:  Cervical, supraclavicular, and axillary nodes normal  Neurologic:   CNII-XII intact, normal strength, sensation and gait; reflexes 2+ and symmetric throughout  Psych: Normal mood, affect, hygiene and grooming  Normal diabetic foot exam  A1c 5.9   ASSESSMENT/PLAN:  Encounter for Kerri annual Richardson exam  Need for prophylactic vaccination and inoculation against influenza - Plan: Flu vaccine HIGH DOSE PF (Fluzone High dose)  Diabetes mellitus with stage 3 chronic kidney disease ([place]) - diabetes is controlled on current regimen.  Last Cr okay to cont on metformin. 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 Encouraged her to resume Weight Watchers (or just follow it on her own); resume exercise program. Risks reviewed  Gout involving toe of right foot, unspecified cause, unspecified chronicity - no further flares since on low dose allopurinol - Plan: Uric acid  Medication monitoring encounter - Plan: Microalbumin / creatinine urine ratio, CBC with Differential/Platelet, Comprehensive metabolic panel, Lipid panel, Uric acid   Uric acid, microalbumin, lipid, CBC, c-met TSH done in April   SEND COPIES [place]   Discussed monthly self breast exams and yearly mammograms; at least 30 minutes of aerobic activity at least  5 days/week, weight-bearing exercise 2x/week; proper sunscreen use reviewed; healthy diet, including goals of calcium and vitamin D intake and alcohol recommendations (less than or equal to 1 drink/day) reviewed; regular seatbelt use; changing batteries in smoke detectors. Immunization recommendations discussed--yearly high dose flu shots, given today. Colonoscopy recommendations reviewed--recent, UTD.  Counseled re: obesity.   Full Code, Full Care. Discussed importance of Living Will and [place].  Advised to get [name] copies of completed forms.  F/u 6 months    Kerri Attestation I have personally reviewed: The patient's medical and social history Their use of alcohol, tobacco or illicit drugs Their current medications and supplements The patient's functional ability including ADLs,fall risks, home safety risks, cognitive, and hearing and visual impairment Diet and physical activities Evidence for depression or mood disorders  The patient's weight, height, and BMI have been recorded in the chart.  I have made referrals, counseling, and provided education to the patient based on review of the above and I have provided the patient with a written personalized care plan for preventive services.     Saloni Lablanc A, Richardson   03/29/2016

## 2016-03-29 NOTE — Patient Instructions (Addendum)
HEALTH MAINTENANCE RECOMMENDATIONS:  It is recommended that you get at least 30 minutes of aerobic exercise at least 5 days/week (for weight loss, you may need as much as 60-90 minutes). This can be any activity that gets your heart rate up. This can be divided in 10-15 minute intervals if needed, but try and build up your endurance at least once a week.  Weight bearing exercise is also recommended twice weekly.  Eat a healthy diet with lots of vegetables, fruits and fiber.  "Colorful" foods have a lot of vitamins (ie green vegetables, tomatoes, red peppers, etc).  Limit sweet tea, regular sodas and alcoholic beverages, all of which has a lot of calories and sugar.  Up to 1 alcoholic drink daily may be beneficial for women (unless trying to lose weight, watch sugars).  Drink a lot of water.  Calcium recommendations are 1200-1500 mg daily (1500 mg for postmenopausal women or women without ovaries), and vitamin D 1000 IU daily.  This should be obtained from diet and/or supplements (vitamins), and calcium should not be taken all at once, but in divided doses.  Monthly self breast exams and yearly mammograms for women over the age of 73 is recommended.  Sunscreen of at least SPF 30 should be used on all sun-exposed parts of the skin when outside between the hours of 10 am and 4 pm (not just when at beach or pool, but even with exercise, golf, tennis, and yard work!)  Use a sunscreen that says "broad spectrum" so it covers both UVA and UVB rays, and make sure to reapply every 1-2 hours.  Remember to change the batteries in your smoke detectors when changing your clock times in the spring and fall.  Use your seat belt every time you are in a car, and please drive safely and not be distracted with cell phones and texting while driving.    [name] , Thank you for taking time to come for your Medicare Wellness Visit. I appreciate your ongoing commitment to your health goals. Please review the  following plan we discussed and let me know if I can assist you in the future.   These are the goals we discussed: Goals    None      This is a list of the screening recommended for you and due dates:  Health Maintenance  Topic Date Due  . Flu Shot  01/13/2016  . Hemoglobin A1C  03/25/2016  . Complete foot exam   09/23/2016  . Eye exam for diabetics  11/25/2016  . Mammogram  03/03/2017  . Tetanus Vaccine  10/18/2017  . Colon Cancer Screening  11/27/2023  . DEXA scan (bone density measurement)  Completed  . Shingles Vaccine  Completed  . Pneumonia vaccines  Completed   Flu shot was given today. A1c was done today Colon cancer screening is due again in June 2020 (not 2025 as stated above). Your mammogram is due now (02/2016, not 02/2017 as stated above).  Please start getting at least 150 minutes of aerobic exercise each week, along with weight-bearing exercise at least 2x/week.  I encourage you to join [employer] program and go at least 2-3 times/week (but getting extra aerobic exercise on other days during the week, even if only in 10-15 minute intervals).  Please resume Weight Watcher technique (counting points, monitoring diet/exercise like you would if paying for Weight Watchers, including weekly weigh-ins).    Consider going for a hearing screening if you continue to have difficulty with  hearing.  Contact your insurance company about getting a new glucometer (let us know if we need to do anything--such as send a prescription for a new one, and to which pharmacy).

## 2016-03-30 LAB — MICROALBUMIN / CREATININE URINE RATIO
CREATININE, URINE: 71 mg/dL (ref 20–320)
MICROALB/CREAT RATIO: 3 ug/mg{creat} (ref <30)
Microalb, Ur: 0.2 mg/dL

## 2016-03-30 MED ORDER — ALLOPURINOL 100 MG PO TABS: 100.0000 mg | ORAL_TABLET | Freq: Every day | ORAL | 1 refills | Status: DC

## 2016-04-01 ENCOUNTER — Telehealth: Payer: Self-pay | Admitting: *Deleted

## 2016-04-01 ENCOUNTER — Other Ambulatory Visit: Payer: Self-pay | Admitting: *Deleted

## 2016-04-01 MED ORDER — ATORVASTATIN CALCIUM 40 MG PO TABS: 40.0000 mg | ORAL_TABLET | Freq: Every day | ORAL | 0 refills | Status: DC

## 2016-04-01 NOTE — Telephone Encounter (Signed)
Patient scheduled appt for 3 months but wanted to come in prior for labs. I know she will need lipid and liver. And your message said you wouldn't know until further med changes are made-so do you want to hold this for a while?

## 2016-04-06 ENCOUNTER — Other Ambulatory Visit: Payer: Self-pay | Admitting: Family Medicine

## 2016-04-06 DIAGNOSIS — Z1231 Encounter for screening mammogram for malignant neoplasm of breast: Secondary | ICD-10-CM

## 2016-04-07 ENCOUNTER — Other Ambulatory Visit: Payer: Self-pay | Admitting: *Deleted

## 2016-04-07 ENCOUNTER — Other Ambulatory Visit: Payer: Self-pay | Admitting: Family Medicine

## 2016-04-07 DIAGNOSIS — N183 Chronic kidney disease, stage 3 unspecified: Secondary | ICD-10-CM

## 2016-04-07 DIAGNOSIS — E1122 Type 2 diabetes mellitus with diabetic chronic kidney disease: Secondary | ICD-10-CM

## 2016-04-07 DIAGNOSIS — E782 Mixed hyperlipidemia: Secondary | ICD-10-CM

## 2016-04-07 DIAGNOSIS — I10 Essential (primary) hypertension: Secondary | ICD-10-CM

## 2016-04-07 DIAGNOSIS — E119 Type 2 diabetes mellitus without complications: Secondary | ICD-10-CM

## 2016-04-07 NOTE — Telephone Encounter (Signed)
Go ahead and add A1c and b-met (or change to c-met, I think LFTs already mentioned as needed).  No harm in repeating those then, even if changes aren't made (as long as is at least 3 months for the A1c).  I believe I had recommended she come for a fasting OV, where I could decide which labs would be done at the visit.  But if she is requesting to come prior for labs, you can enter orders for A1c, c-met and lipids.

## 2016-04-14 NOTE — Progress Notes (Signed)
Chief Complaint  Patient presents with  . Follow-up    2 week follow up, recheck BMET.   Patient presents for 2 week follow-up.  She was noted to have significant elevation in her creatinine on her most recent labs.  I had spoken with her nephrologist, who only sees her once a year.  We advised her to cut her furosemide dose in 1/2 (taking 57m instead of 457m, to increase fluid intake. She is due to repeat chem panel today.  If her Cr remains significantly elevated, we will have to stop her metformin.   Chemistry      Component Value Date/Time   NA 138 03/29/2016 1335   K 4.6 03/29/2016 1335   CL 104 03/29/2016 1335   CO2 23 03/29/2016 1335   BUN 51 (H) 03/29/2016 1335   CREATININE 1.82 (H) 03/29/2016 1335      Component Value Date/Time   CALCIUM 8.8 03/29/2016 1335   ALKPHOS 69 03/29/2016 1335   AST 13 03/29/2016 1335   ALT 10 03/29/2016 1335   BILITOT 0.4 03/29/2016 1335     She denies any swelling since cutting the dose of furosemide in half. She continues on metformin (hasn't been told to stop yet). She hasn't had a functioning glucometer, but she brought in a list of the covered strips so that she can get a new monitor and restart regular glucose monitoring.   She is fasting today--didn't take her blood pressure medication yet.  Since her last visit, we also increased her atorvastatin from 20 mg to 4051mShe is tolerating this without side effects. She denies any changes to her diet. Lab Results  Component Value Date   CHOL 192 03/29/2016   HDL 45 (L) 03/29/2016   LDLCALC 122 03/29/2016   TRIG 123 03/29/2016   CHOLHDL 4.3 03/29/2016   PMH, PSH, SH reviewed  Outpatient Encounter Prescriptions as of 04/15/2016  Medication Sig Note  . allopurinol (ZYLOPRIM) 100 MG tablet Take 1 tablet (100 mg total) by mouth daily.   . aMarland KitchenLODipine (NORVASC) 10 MG tablet Take 1 tablet (10 mg total) by mouth daily.   . aMarland Kitchenpirin 81 MG tablet Take 81 mg by mouth daily.     . aMarland Kitchenorvastatin  (LIPITOR) 40 MG tablet Take 1 tablet (40 mg total) by mouth daily.   . Cholecalciferol (VITAMIN D) 1000 UNITS capsule Take 1,000 Units by mouth daily.     . enalapril (VASOTEC) 10 MG tablet Take 1 tablet (10 mg total) by mouth daily.   . furosemide (LASIX) 40 MG tablet Take 40 mg by mouth daily.  04/15/2016: Taking 35m7mily  . Insulin Syringe-Needle U-100 (RELION INSULIN SYR 0.5ML/31G) 31G X 5/16" 0.5 ML MISC 1 each by Does not apply route daily.   . LAMarland KitchenTUS 100 UNIT/ML injection INJECT 25 UNITS  INTO THE SKIN AT BEDTIME (DISCARD AND BEGIN A NEW VIAL EVERY 28 DAYS)   . latanoprost (XALATAN) 0.005 % ophthalmic solution Place 1 drop into both eyes at bedtime.     . metFORMIN (GLUCOPHAGE) 850 MG tablet Take 1 tablet (850 mg total) by mouth 2 (two) times daily with a meal.   . pioglitazone (ACTOS) 30 MG tablet Take 1 tablet (30 mg total) by mouth daily.   . vitamin B-12 (CYANOCOBALAMIN) 100 MCG tablet Take 100 mcg by mouth daily.     . [DISCONTINUED] BD ULTRA-FINE LANCETS lancets 100 each by Other route. Test qd-bid  Dx e11.9   . [DISCONTINUED] Blood Glucose Monitoring Suppl (ACCU-CHEK  NANO SMARTVIEW) w/Device KIT by Does not apply route. Patient to check blood sugars qd-BID daily. DX-e11.9   . [DISCONTINUED] glucose blood test strip 1 each by Other route as needed for other. Test qd-bid daily  Dx e11.9   . acetaminophen (TYLENOL) 500 MG tablet Take 1,000 mg by mouth every 6 (six) hours as needed for mild pain. Reported on 09/24/2015   . colchicine 0.6 MG tablet TAKE ONE TO TWO TABLETS BY MOUTH ONCE DAILY AS NEEDED FOR  GOUT (Patient not taking: Reported on 04/15/2016)   . [DISCONTINUED] Blood Glucose Monitoring Suppl (ACCU-CHEK NANO SMARTVIEW) W/DEVICE KIT USE AS DIRECTED (Patient taking differently: test once daily)    No facility-administered encounter medications on file as of 04/15/2016.    No Known Allergies  ROS: no fever, chills, headaches, dizziness, chest pain, shortness of breath, edema,  nausea, vomiting, diarrhea, bleeding, bruising, rashes.  Gout flare in right foot last week, now resolved. Denies urinary complaints.  PHYSICAL EXAM:  BP 140/70 (BP Location: Left Arm, Patient Position: Sitting, Cuff Size: Normal)   Pulse 88   Ht '5\' 2"'  (1.575 m)   Wt 284 lb (128.8 kg)   LMP 06/14/1998 (Approximate)   BMI 51.94 kg/m   142/68 on repeat by MD, [place] Well developed, pleasant female in no distress Neck: no lymphadenopathy or mass Heart: regular rate and rhythm Lungs: clear bilaterally Extremities: no edema Psych: normal mood, affect, hygiene and grooming Neuro: alert and oriented, cranial nerves intact, normal gait. Skin: normal turgor, no rash or lesions  ASSESSMENT/PLAN:  Diabetes mellitus with stage 3 chronic kidney disease ([place]) - Plan: Basic metabolic panel  Mixed hyperlipidemia   Low cholesterol diet reviewed in detail.  Send copies of today's labs to Dr. [name]. She has appt with him 11/10.  (fasting today)   Fu as scheduled for lipids, c-met and A1c prior to visit in January.

## 2016-04-15 ENCOUNTER — Ambulatory Visit (INDEPENDENT_AMBULATORY_CARE_PROVIDER_SITE_OTHER): Payer: Commercial Managed Care - HMO | Admitting: Family Medicine

## 2016-04-15 ENCOUNTER — Other Ambulatory Visit: Payer: Self-pay | Admitting: *Deleted

## 2016-04-15 ENCOUNTER — Encounter: Payer: Self-pay | Admitting: Family Medicine

## 2016-04-15 VITALS — BP 140/70 | HR 88 | Ht 62.0 in | Wt 284.0 lb

## 2016-04-15 DIAGNOSIS — E782 Mixed hyperlipidemia: Secondary | ICD-10-CM | POA: Diagnosis not present

## 2016-04-15 DIAGNOSIS — E1122 Type 2 diabetes mellitus with diabetic chronic kidney disease: Secondary | ICD-10-CM

## 2016-04-15 DIAGNOSIS — N183 Chronic kidney disease, stage 3 (moderate): Secondary | ICD-10-CM

## 2016-04-15 MED ORDER — GLUCOSE BLOOD VI STRP: 1.0000 | ORAL_STRIP | Freq: Two times a day (BID) | 2 refills | Status: DC

## 2016-04-15 MED ORDER — BD ULTRA-FINE LANCETS MISC: 100.0000 | Freq: Two times a day (BID) | 2 refills | Status: DC

## 2016-04-15 MED ORDER — GLUCOSE BLOOD VI STRP: 1.0000 | ORAL_STRIP | 2 refills | Status: DC | PRN

## 2016-04-15 MED ORDER — ACCU-CHEK NANO SMARTVIEW W/DEVICE KIT: 1.0000 | PACK | Freq: Two times a day (BID) | 0 refills | Status: DC

## 2016-04-15 NOTE — Patient Instructions (Addendum)
Check your bottle--acetaminophen is the other name for tylenol.  This is fine for you to take for any pain, headache, etc.  Do not use ibuprofen, Goody/BC powder, advil, motrin, naproxen, Aleve.  We will contact you with your test results.  If the Creatinine remains high, we may need to change your medication (stop the metformin and titrate up your insulin dose).  So, be sure to get your new glucometer and test strips.    Continue the 40mg  of atorvastatin. Continue the 1/2 tablet of the furosemide.  I will send the copies of your labs from today to Dr. Lowell GuitarPowell.  Fat and Cholesterol Restricted Diet High levels of fat and cholesterol in your blood may lead to various health problems, such as diseases of the heart, blood vessels, gallbladder, liver, and pancreas. Fats are concentrated sources of energy that come in various forms. Certain types of fat, including saturated fat, may be harmful in excess. Cholesterol is a substance needed by your body in small amounts. Your body makes all the cholesterol it needs. Excess cholesterol comes from the food you eat. When you have high levels of cholesterol and saturated fat in your blood, health problems can develop because the excess fat and cholesterol will gather along the walls of your blood vessels, causing them to narrow. Choosing the right foods will help you control your intake of fat and cholesterol. This will help keep the levels of these substances in your blood within normal limits and reduce your risk of disease. WHAT IS MY PLAN? Your health care provider recommends that you:  Get no more than __________ % of the total calories in your daily diet from fat.  Limit your intake of saturated fat to less than ______% of your total calories each day.  Limit the amount of cholesterol in your diet to less than _________mg per day. WHAT TYPES OF FAT SHOULD I CHOOSE?  Choose healthy fats more often. Choose monounsaturated and polyunsaturated fats, such  as olive and canola oil, flaxseeds, walnuts, almonds, and seeds.  Eat more omega-3 fats. Good choices include salmon, mackerel, sardines, tuna, flaxseed oil, and ground flaxseeds. Aim to eat fish at least two times a week.  Limit saturated fats. Saturated fats are primarily found in animal products, such as meats, butter, and cream. Plant sources of saturated fats include palm oil, palm kernel oil, and coconut oil.  Avoid foods with partially hydrogenated oils in them. These contain trans fats. Examples of foods that contain trans fats are stick margarine, some tub margarines, cookies, crackers, and other baked goods. WHAT GENERAL GUIDELINES DO I NEED TO FOLLOW? These guidelines for healthy eating will help you control your intake of fat and cholesterol:  Check food labels carefully to identify foods with trans fats or high amounts of saturated fat.  Fill one half of your plate with vegetables and green salads.  Fill one fourth of your plate with whole grains. Look for the word "whole" as the first word in the ingredient list.  Fill one fourth of your plate with lean protein foods.  Limit fruit to two servings a day. Choose fruit instead of juice.  Eat more foods that contain soluble fiber. Examples of foods that contain this type of fiber are apples, broccoli, carrots, beans, peas, and barley. Aim to get 20-30 g of fiber per day.  Eat more home-cooked food and less restaurant, buffet, and fast food.  Limit or avoid alcohol.  Limit foods high in starch and sugar.  Limit  fried foods.  Cook foods using methods other than frying. Baking, boiling, grilling, and broiling are all great options.  Lose weight if you are overweight. Losing just 5-10% of your initial body weight can help your overall health and prevent diseases such as diabetes and heart disease. WHAT FOODS CAN I EAT? Grains Whole grains, such as whole wheat or whole grain breads, crackers, cereals, and pasta. Unsweetened  oatmeal, bulgur, barley, quinoa, or brown rice. Corn or whole wheat flour tortillas. Vegetables Fresh or frozen vegetables (raw, steamed, roasted, or grilled). Green salads. Fruits All fresh, canned (in natural juice), or frozen fruits. Meat and Other Protein Products Ground beef (85% or leaner), grass-fed beef, or beef trimmed of fat. Skinless chicken or [place]. Ground chicken or [place]. Pork trimmed of fat. All fish and seafood. Eggs. Dried beans, peas, or lentils. Unsalted nuts or seeds. Unsalted canned or dry beans. Dairy Low-fat dairy products, such as skim or 1% milk, 2% or reduced-fat cheeses, low-fat ricotta or cottage cheese, or plain low-fat yogurt. Fats and Oils Tub margarines without trans fats. Light or reduced-fat mayonnaise and salad dressings. Avocado. Olive, canola, sesame, or safflower oils. Natural peanut or almond butter (choose ones without added sugar and oil). The items listed above may not be a complete list of recommended foods or beverages. Contact your dietitian for more options. WHAT FOODS ARE NOT RECOMMENDED? Grains White bread. White pasta. White rice. Cornbread. Bagels, pastries, and croissants. Crackers that contain trans fat. Vegetables White potatoes. Corn. Creamed or fried vegetables. Vegetables in a cheese sauce. Fruits Dried fruits. Canned fruit in light or heavy syrup. Fruit juice. Meat and Other Protein Products Fatty cuts of meat. Ribs, chicken wings, bacon, sausage, bologna, salami, chitterlings, fatback, hot dogs, bratwurst, and packaged luncheon meats. Liver and organ meats. Dairy Whole or 2% milk, cream, half-and-half, and cream cheese. Whole milk cheeses. Whole-fat or sweetened yogurt. Full-fat cheeses. Nondairy creamers and whipped toppings. Processed cheese, cheese spreads, or cheese curds. Sweets and Desserts Corn syrup, sugars, honey, and molasses. Candy. Jam and jelly. Syrup. Sweetened cereals. Cookies, pies, cakes, donuts, muffins, and ice  cream. Fats and Oils Butter, stick margarine, lard, shortening, ghee, or bacon fat. Coconut, palm kernel, or palm oils. Beverages Alcohol. Sweetened drinks (such as sodas, lemonade, and fruit drinks or punches). The items listed above may not be a complete list of foods and beverages to avoid. Contact your dietitian for more information.   This information is not intended to replace advice given to you by your health care provider. Make sure you discuss any questions you have with your health care provider.   Document Released: 05/31/2005 Document Revised: 06/21/2014 Document Reviewed: 08/29/2013 Elsevier Interactive Patient Education [employer]2016 Elsevier Inc.

## 2016-04-16 LAB — BASIC METABOLIC PANEL
BUN: 26 mg/dL — ABNORMAL HIGH (ref 7–25)
CALCIUM: 9.1 mg/dL (ref 8.6–10.4)
CO2: 23 mmol/L (ref 20–31)
CREATININE: 1.36 mg/dL — AB (ref 0.60–0.93)
Chloride: 107 mmol/L (ref 98–110)
GLUCOSE: 95 mg/dL (ref 65–99)
Potassium: 4.7 mmol/L (ref 3.5–5.3)
Sodium: 140 mmol/L (ref 135–146)

## 2016-04-22 ENCOUNTER — Ambulatory Visit
Admission: RE | Admit: 2016-04-22 | Discharge: 2016-04-22 | Disposition: A | Discharge status: Home / Self Care | Payer: Commercial Managed Care - HMO | Source: Ambulatory Visit | Attending: Family Medicine | Admitting: Family Medicine

## 2016-04-22 DIAGNOSIS — Z1231 Encounter for screening mammogram for malignant neoplasm of breast: Secondary | ICD-10-CM

## 2016-04-23 DIAGNOSIS — N183 Chronic kidney disease, stage 3 (moderate): Secondary | ICD-10-CM | POA: Diagnosis not present

## 2016-04-23 DIAGNOSIS — I10 Essential (primary) hypertension: Secondary | ICD-10-CM | POA: Diagnosis not present

## 2016-04-23 DIAGNOSIS — I129 Hypertensive chronic kidney disease with stage 1 through stage 4 chronic kidney disease, or unspecified chronic kidney disease: Secondary | ICD-10-CM | POA: Diagnosis not present

## 2016-06-09 ENCOUNTER — Other Ambulatory Visit: Payer: Self-pay | Admitting: Family Medicine

## 2016-06-28 ENCOUNTER — Other Ambulatory Visit: Payer: Self-pay

## 2016-06-28 ENCOUNTER — Other Ambulatory Visit: Payer: Self-pay | Admitting: Family Medicine

## 2016-06-28 DIAGNOSIS — E119 Type 2 diabetes mellitus without complications: Secondary | ICD-10-CM

## 2016-06-30 ENCOUNTER — Ambulatory Visit: Payer: Commercial Managed Care - HMO | Admitting: Obstetrics and Gynecology

## 2016-07-01 ENCOUNTER — Ambulatory Visit: Payer: Self-pay | Admitting: Family Medicine

## 2016-07-05 ENCOUNTER — Ambulatory Visit: Payer: Commercial Managed Care - HMO | Admitting: Obstetrics and Gynecology

## 2016-07-05 NOTE — Progress Notes (Deleted)
76 y.o. G0P0000 Single African American female here for annual exam.    PCP:     Patient's last menstrual period was 06/14/1998 (approximate).           Sexually active: {yes no:314532}  The current method of family planning is post menopausal status.    Exercising: {yes HC:[id number]}  {types:19826} Smoker:  Former--quit 2002  Health Maintenance: Pap:  02-24-15 Neg:Pos HR HPV;Pos 18/45  History of abnormal Pap:  Yes, 02-24-15 Neg:Pos HR HPV;Pos 18/45 with unsatisfactory colposcopy. MMG:  04-22-16 Density B/Neg/BiRads1:TBC Colonoscopy:  11-26-13 polyp with Dr.Mann;next due 11/2018 BMD:   02-10-10  Result:Normal:TBC TDaP:  PCP Gardasil:   N/A HIV: Hep C: Screening Labs:  Hb today: ***, Urine today: ***   reports that she quit smoking about 16 years ago. She has never used smokeless tobacco. She reports that she does not drink alcohol or use drugs.  Past Medical History:  Diagnosis Date  . Arthritis   . CKD (chronic kidney disease) stage 3, GFR 30-59 ml/min    Dr. [name]  . Diabetic retinopathy   . DM retinopathy ([place]) 08/17/12   early  . Essential hypertension, benign   . Glaucoma   . Gout   . Pure hypercholesterolemia   . Type II or unspecified type diabetes mellitus without mention of complication, not stated as uncontrolled     Past Surgical History:  Procedure Laterality Date  . BRAIN SURGERY  1969   Burr holes in skull to alleviate pressure on the brain (no shunt)  . CATARACT EXTRACTION, BILATERAL  2012   Dr. [name]  . COLONOSCOPY  3/05, 11/2013   due again 2020  . COLPOSCOPY N/A 05/27/2015   Procedure: COLPOSCOPY;  Surgeon: [name], MD;  Location: [facility];  Service: Gynecology;  Laterality: N/A;  . LEEP N/A 05/27/2015   Procedure: LOOP ELECTROSURGICAL EXCISION PROCEDURE (LEEP);  Surgeon: [name], MD;  Location: [facility];  Service: Gynecology;  Laterality: N/A;  . TONSILLECTOMY  age 19    Current Outpatient Prescriptions  Medication Sig  Dispense Refill  . ACCU-CHEK FASTCLIX LANCETS MISC TEST TWO TIMES DAILY 204 each 2  . ACCU-CHEK SMARTVIEW test strip USE TWICE DAILY 200 each 2  . acetaminophen (TYLENOL) 500 MG tablet Take 1,000 mg by mouth every 6 (six) hours as needed for mild pain. Reported on 09/24/2015    . allopurinol (ZYLOPRIM) 100 MG tablet Take 1 tablet (100 mg total) by mouth daily. 90 tablet 1  . amLODipine (NORVASC) 10 MG tablet Take 1 tablet (10 mg total) by mouth daily. 90 tablet 1  . aspirin 81 MG tablet Take 81 mg by mouth daily.      Marland Kitchen atorvastatin (LIPITOR) 40 MG tablet TAKE 1 TABLET (40 MG TOTAL) BY MOUTH DAILY. 90 tablet 0  . BD INSULIN SYRINGE ULTRAFINE 31G X 5/16" 0.5 ML MISC USE  1 EVERY DAY 90 each 3  . Blood Glucose Monitoring Suppl (ACCU-CHEK NANO SMARTVIEW) w/Device KIT 1 each by Does not apply route 2 (two) times daily. BID 1 kit 0  . Cholecalciferol (VITAMIN D) 1000 UNITS capsule Take 1,000 Units by mouth daily.      . colchicine 0.6 MG tablet TAKE ONE TO TWO TABLETS BY MOUTH ONCE DAILY AS NEEDED FOR  GOUT (Patient not taking: Reported on 04/15/2016) 30 tablet 0  . enalapril (VASOTEC) 10 MG tablet Take 1 tablet (10 mg total) by mouth daily. 90 tablet 1  . furosemide (LASIX)  40 MG tablet Take 40 mg by mouth daily.     Marland Kitchen LANTUS 100 UNIT/ML injection INJECT 25 UNITS  INTO THE SKIN AT BEDTIME (DISCARD AND BEGIN A NEW VIAL EVERY 28 DAYS) 30 mL 1  . latanoprost (XALATAN) 0.005 % ophthalmic solution Place 1 drop into both eyes at bedtime.      . metFORMIN (GLUCOPHAGE) 850 MG tablet Take 1 tablet (850 mg total) by mouth 2 (two) times daily with a meal. 180 tablet 1  . pioglitazone (ACTOS) 30 MG tablet Take 1 tablet (30 mg total) by mouth daily. 90 tablet 1  . vitamin B-12 (CYANOCOBALAMIN) 100 MCG tablet Take 100 mcg by mouth daily.       No current facility-administered medications for this visit.     Family History  Problem Relation Age of Onset  . Hypertension Mother   . Cancer Mother     lung  (nonsmoker)  . Stroke Father   . Hypertension Father   . Diabetes Father   . Hypertension Sister   . Diabetes Sister   . Heart disease Sister   . Hypertension Sister   . Breast cancer Maternal Aunt 56    37 of old age  . Diabetes Paternal Grandmother     ROS:  Pertinent items are noted in HPI.  Otherwise, a comprehensive ROS was negative.  Exam:   LMP 06/14/1998 (Approximate)     General appearance: alert, cooperative and appears stated age Head: Normocephalic, without obvious abnormality, atraumatic Neck: no adenopathy, supple, symmetrical, trachea midline and thyroid normal to inspection and palpation Lungs: clear to auscultation bilaterally Breasts: normal appearance, no masses or tenderness, No nipple retraction or dimpling, No nipple discharge or bleeding, No axillary or supraclavicular adenopathy Heart: regular rate and rhythm Abdomen: soft, non-tender; no masses, no organomegaly Extremities: extremities normal, atraumatic, no cyanosis or edema Skin: Skin color, texture, turgor normal. No rashes or lesions Lymph nodes: Cervical, supraclavicular, and axillary nodes normal. No abnormal inguinal nodes palpated Neurologic: Grossly normal  Pelvic: External genitalia:  no lesions              Urethra:  normal appearing urethra with no masses, tenderness or lesions              Bartholins and Skenes: normal                 Vagina: normal appearing vagina with normal color and discharge, no lesions              Cervix: no lesions              Pap taken: {yes no:314532} Bimanual Exam:  Uterus:  normal size, contour, position, consistency, mobility, non-tender              Adnexa: no mass, fullness, tenderness              Rectal exam: {yes no:314532}.  Confirms.              Anus:  normal sphincter tone, no lesions  Chaperone was present for exam.  Assessment:   Well woman visit with normal exam.   Plan: Mammogram screening discussed. Recommended self breast  awareness. Pap and HR HPV as above. Guidelines for Calcium, Vitamin D, regular exercise program including cardiovascular and weight bearing exercise.   Follow up annually and prn.   Additional counseling given.  {yes [id number]. _______ minutes face to face time of which over 50% was spent in counseling.  After visit summary provided.

## 2016-07-14 ENCOUNTER — Ambulatory Visit (INDEPENDENT_AMBULATORY_CARE_PROVIDER_SITE_OTHER): Payer: Medicare HMO | Admitting: Obstetrics and Gynecology

## 2016-07-14 ENCOUNTER — Encounter: Payer: Self-pay | Admitting: Obstetrics and Gynecology

## 2016-07-14 VITALS — BP 160/82 | HR 100 | Resp 22 | Ht 61.5 in | Wt 279.0 lb

## 2016-07-14 DIAGNOSIS — Z1151 Encounter for screening for human papillomavirus (HPV): Secondary | ICD-10-CM | POA: Diagnosis not present

## 2016-07-14 DIAGNOSIS — Z01419 Encounter for gynecological examination (general) (routine) without abnormal findings: Secondary | ICD-10-CM | POA: Diagnosis not present

## 2016-07-14 NOTE — Progress Notes (Signed)
76 y.o. G0P0000 Single African American female here for annual exam.    LEEP done 12/16 -  LGSIL.  No postmenopausal bleeding.   HgbA1C 5.9. 03/29/16.  PCP:  Rita Ohara, MD   Patient's last menstrual period was 06/14/1998 (approximate).           Sexually active: No. female The current method of family planning is post menopausal status.    Exercising: No.   Smoker:  Former  Health Maintenance: Pap:  02-24-15 Neg:Pos HR HPV and Pos 18/45 History of abnormal Pap:  Yes, 02-24-15 Neg:Pos HR HPV and Pos 18/45;colposcopy 04-25-15 unsatisfactory.  LEEP 05-27-15 revealed LGSIL of cervix, margins negative and ECC negative. MMG:  04-22-16 Density B/Neg/BiRads1:TBC Colonoscopy:  11-26-13 polyp with Dr.Mann;next due 11/2018. BMD:   02-10-10  Result  Normal:TBC TDaP:  10-19-07 Gardasil:   N/A Hep C:   NA Screening Labs:  Hb today: PCP, Urine today: unable to void   reports that she quit smoking about 16 years ago. She has never used smokeless tobacco. She reports that she does not drink alcohol or use drugs.  Past Medical History:  Diagnosis Date  . Arthritis   . CKD (chronic kidney disease) stage 3, GFR 30-59 ml/min    Dr. [name]  . Diabetic retinopathy   . DM retinopathy ([place]) 08/17/12   early  . Essential hypertension, benign   . Glaucoma   . Gout   . Pure hypercholesterolemia   . Type II or unspecified type diabetes mellitus without mention of complication, not stated as uncontrolled     Past Surgical History:  Procedure Laterality Date  . BRAIN SURGERY  1969   Burr holes in skull to alleviate pressure on the brain (no shunt)  . CATARACT EXTRACTION, BILATERAL  2012   Dr. [name]  . COLONOSCOPY  3/05, 11/2013   due again 2020  . COLPOSCOPY N/A 05/27/2015   Procedure: COLPOSCOPY;  Surgeon: [name], MD;  Location: [facility];  Service: Gynecology;  Laterality: N/A;  . LEEP N/A 05/27/2015   Procedure: LOOP ELECTROSURGICAL EXCISION PROCEDURE (LEEP);  Surgeon: [name], MD;  Location: [facility];  Service: Gynecology;  Laterality: N/A;  . TONSILLECTOMY  age 80    Current Outpatient Prescriptions  Medication Sig Dispense Refill  . ACCU-CHEK FASTCLIX LANCETS MISC TEST TWO TIMES DAILY 204 each 2  . ACCU-CHEK SMARTVIEW test strip USE TWICE DAILY 200 each 2  . acetaminophen (TYLENOL) 500 MG tablet Take 1,000 mg by mouth every 6 (six) hours as needed for mild pain. Reported on 09/24/2015    . allopurinol (ZYLOPRIM) 100 MG tablet Take 1 tablet (100 mg total) by mouth daily. 90 tablet 1  . amLODipine (NORVASC) 10 MG tablet Take 1 tablet (10 mg total) by mouth daily. 90 tablet 1  . aspirin 81 MG tablet Take 81 mg by mouth daily.      Marland Kitchen atorvastatin (LIPITOR) 40 MG tablet TAKE 1 TABLET (40 MG TOTAL) BY MOUTH DAILY. 90 tablet 0  . BD INSULIN SYRINGE ULTRAFINE 31G X 5/16" 0.5 ML MISC USE  1 EVERY DAY 90 each 3  . Blood Glucose Monitoring Suppl (ACCU-CHEK NANO SMARTVIEW) w/Device KIT 1 each by Does not apply route 2 (two) times daily. BID 1 kit 0  . Cholecalciferol (VITAMIN D) 1000 UNITS capsule Take 1,000 Units by mouth daily.      . colchicine 0.6 MG tablet TAKE ONE TO TWO TABLETS BY MOUTH ONCE DAILY AS NEEDED  FOR  GOUT 30 tablet 0  . enalapril (VASOTEC) 10 MG tablet Take 1 tablet (10 mg total) by mouth daily. 90 tablet 1  . furosemide (LASIX) 40 MG tablet Take 40 mg by mouth daily.     Marland Kitchen LANTUS 100 UNIT/ML injection INJECT 25 UNITS  INTO THE SKIN AT BEDTIME (DISCARD AND BEGIN A NEW VIAL EVERY 28 DAYS) 30 mL 1  . latanoprost (XALATAN) 0.005 % ophthalmic solution Place 1 drop into both eyes at bedtime.      . metFORMIN (GLUCOPHAGE) 850 MG tablet Take 1 tablet (850 mg total) by mouth 2 (two) times daily with a meal. 180 tablet 1  . pioglitazone (ACTOS) 30 MG tablet Take 1 tablet (30 mg total) by mouth daily. 90 tablet 1  . vitamin B-12 (CYANOCOBALAMIN) 100 MCG tablet Take 100 mcg by mouth daily.       No current facility-administered medications for this visit.      Family History  Problem Relation Age of Onset  . Hypertension Mother   . Cancer Mother     lung (nonsmoker)  . Stroke Father   . Hypertension Father   . Diabetes Father   . Hypertension Sister   . Diabetes Sister   . Heart disease Sister   . Hypertension Sister   . Breast cancer Maternal Aunt 31    22 of old age  . Diabetes Paternal Grandmother     ROS:  Pertinent items are noted in HPI.  Otherwise, a comprehensive ROS was negative.  Exam:   BP (!) 160/82 (BP Location: Right Arm, Patient Position: Sitting, Cuff Size: Large)   Pulse 100   Resp (!) 22   Ht 5' 1.5" (1.562 m)   Wt 279 lb (126.6 kg)   LMP 06/14/1998 (Approximate)   BMI 51.86 kg/m     General appearance: alert, cooperative and appears stated age Head: Normocephalic, without obvious abnormality, atraumatic Neck: no adenopathy, supple, symmetrical, trachea midline and thyroid normal to inspection and palpation Lungs: clear to auscultation bilaterally Breasts: normal appearance, no masses or tenderness, No nipple retraction or dimpling, No nipple discharge or bleeding, No axillary or supraclavicular adenopathy Heart: S1S2 RRR.  Occasional additional beat.  PAC or PVC?  Abdomen: obese, soft, non-tender; no masses, no organomegaly Extremities: extremities normal, atraumatic, no cyanosis or edema Skin: Skin color, texture, turgor normal. No rashes or lesions Lymph nodes: Cervical, supraclavicular, and axillary nodes normal. No abnormal inguinal nodes palpated Neurologic: Grossly normal  Pelvic: External genitalia:  no lesions              Urethra:  normal appearing urethra with no masses, tenderness or lesions              Bartholins and Skenes: normal                 Vagina: normal appearing vagina with normal color and discharge, no lesions              Cervix: no lesions              Pap taken: Yes.   Bimanual Exam:  Uterus:  normal size, contour, position, consistency, mobility, non-tender               Adnexa: no mass, fullness, tenderness              Rectal exam: Yes.  .  Confirms.              Anus:  normal  sphincter tone, no lesions  Chaperone was present for exam.  Assessment:   Well woman visit with normal exam. Hx LGSIL.  Status post LEEP.  Arrhythmia.  DM.  Plan: Mammogram screening discussed. Recommended self breast awareness. Pap and HR HPV a performed.  Guidelines for Calcium, Vitamin D, regular exercise program including cardiovascular and weight bearing exercise. Will refer patient to her PCP for further evaluation of her potential cardiac arrhythmia. Follow up annually and prn.       After visit summary provided.

## 2016-07-14 NOTE — Patient Instructions (Signed)

## 2016-07-14 NOTE — Progress Notes (Signed)
Spoke with patient while in office. Patient states she sees Dr. Lynelle DoctorKnapp for pcp. Advised patient will contact pcp 2/1 for scheduling f/u for arrthymia. Patient states can take first available appointment. Best contact for patient [phone]. Advised patient will return call 2/1 with appointment. Patient verbalizes understanding and is agreeable.

## 2016-07-15 ENCOUNTER — Telehealth: Payer: Self-pay | Admitting: *Deleted

## 2016-07-15 ENCOUNTER — Ambulatory Visit (INDEPENDENT_AMBULATORY_CARE_PROVIDER_SITE_OTHER): Payer: Medicare HMO | Admitting: Family Medicine

## 2016-07-15 ENCOUNTER — Encounter: Payer: Self-pay | Admitting: Family Medicine

## 2016-07-15 VITALS — BP 130/70 | HR 88 | Ht 61.5 in | Wt 280.2 lb

## 2016-07-15 DIAGNOSIS — I499 Cardiac arrhythmia, unspecified: Secondary | ICD-10-CM | POA: Diagnosis not present

## 2016-07-15 DIAGNOSIS — E1122 Type 2 diabetes mellitus with diabetic chronic kidney disease: Secondary | ICD-10-CM

## 2016-07-15 DIAGNOSIS — N183 Chronic kidney disease, stage 3 (moderate): Secondary | ICD-10-CM

## 2016-07-15 DIAGNOSIS — I10 Essential (primary) hypertension: Secondary | ICD-10-CM | POA: Diagnosis not present

## 2016-07-15 DIAGNOSIS — I498 Other specified cardiac arrhythmias: Secondary | ICD-10-CM

## 2016-07-15 DIAGNOSIS — Z5181 Encounter for therapeutic drug level monitoring: Secondary | ICD-10-CM

## 2016-07-15 LAB — BASIC METABOLIC PANEL
BUN: 24 mg/dL (ref 7–25)
CHLORIDE: 107 mmol/L (ref 98–110)
CO2: 22 mmol/L (ref 20–31)
CREATININE: 1.6 mg/dL — AB (ref 0.60–0.93)
Calcium: 9 mg/dL (ref 8.6–10.4)
GLUCOSE: 118 mg/dL — AB (ref 65–99)
Potassium: 4.6 mmol/L (ref 3.5–5.3)
Sodium: 140 mmol/L (ref 135–146)

## 2016-07-15 NOTE — Patient Instructions (Signed)
Premature Atrial Contraction A premature atrial contraction (PAC) is a kind of irregular heartbeat (arrhythmia). It happens when the heart beats too early and then pauses before beating again. PACs are also called skipped heartbeats because they may make you feel like your heart is stopping for a second, even though the heart does not actually skip a beat. The heart has four areas, or chambers. Normally, electrical signals spread across the heart and make all the chambers beat together. During a PAC, the upper chambers of the heart (right atrium and left atrium) beat too early, before they have had time to fill with blood. The heartbeat pauses afterward so the heart can fill with blood for the next beat. What are the causes? The cause of this condition is often unknown. Sometimes it is caused by heart disease or injury to the heart. What increases the risk? This condition is more likely to develop in adults who are 50 years of age or older and in children. Episodes may be triggered by:  Caffeine.  Stress.  Tiredness.  Alcohol.  Smoking.  Stimulant drugs.  Heart disease.  What are the signs or symptoms? Symptoms of this condition include:  A feeling that your heart skipped a beat. The first heartbeat after the "skipped" beat may feel more forceful.  A feeling that your heart is fluttering.  How is this diagnosed? This condition is diagnosed based on:  Your symptoms.  A physical exam. Your health care provider may listen to your heart.  Tests to rule out other conditions, such as a test that records the electrical impulses of the heart and assesses heart health (electrocardiogram, or ECG). If you have an ECG, you may need to wear a portable ECG machine (Holter monitor) that records your heart beats for 24 hours or more.  How is this treated?  Usually, treatment is not needed for this condition. If you have episodes that happen often or if a cause is found, you may receive  treatment for the underlying cause of your PACs. Follow these instructions at home: Lifestyle Follow these instructions as told by your health care provider:  Do not use any products that contain nicotine or tobacco, such as cigarettes and e-cigarettes. If you need help quitting, ask your health care provider.  If caffeine triggers episodes, do not eat, drink, or use anything with caffeine in it.  If caffeine does not seem to trigger episodes, consume caffeine in moderation.  If alcohol triggers episodes of PAC, do not drink alcohol.  If alcohol does not seem to trigger episodes, limit alcohol intake to no more than 1 drink a day for nonpregnant women and 2 drinks a day for men. One drink equals 12 oz of beer, 5 oz of wine, or 1 oz of hard liquor.  Exercise regularly. Ask your health care provider what type of exercise is safe for you.  Find healthy ways to manage stress. Avoid stressful situations when possible.  Try to get at least 7-9 hours of sleep each night, or as much as recommended by your health care provider.  Do not use illegal drugs.  General instructions  Take over-the-counter and prescription medicines only as told by your health care provider.  Keep all follow-up visits as told by your health care provider. This is important. Contact a health care provider if:  You feel your heart skipping beats more than once a day.  Your heart skips beats and you feel dizzy, light-headed, or very tired. Get help right away   have chest pain.  You have trouble breathing. This information is not intended to replace advice given to you by your health care provider. Make sure you discuss any questions you have with your health care provider. Document Released: 02/01/2014 Document Revised: 01/27/2016 Document Reviewed: 11/28/2015 Elsevier Interactive Patient Education  2017 [employer]   We are checking your electrolytes, and referring you to a cardiologist for further evaluation  of the abnormal heart rhythm (very frequent premature contractions, every other beat).

## 2016-07-15 NOTE — Telephone Encounter (Signed)
Spoke with Lynden Angathy at Dr. Delford FieldKnapp's office. Advised patient was seen by Dr. Edward JollySilva 1/31, needs f/u with Dr. Lynelle DoctorKnapp for arrhythmia. Lynden AngCathy states she had received a message from Dr.Knapp to call patient today and schedule appointment with her to be seen today. Lynden AngCathy will return call to patient for scheduling this morning. Advised will update Dr. Edward JollySilva.  Routing to provider for final review. Patient is agreeable to disposition. Will close encounter.

## 2016-07-15 NOTE — Progress Notes (Signed)
Chief Complaint  Patient presents with  . Follow-up    saw Dr Quincy Simmonds yesterday and was sent over to see Dr.Velisa Regnier for arrhythmia.    She was at her routine GYN follow-up and Dr. Quincy Simmonds noted she had an irregular heart beat. She was told to f/u here for evaluation. She denies any palpitations, tachycardia, any chest pain.  Denies any edema since being on the lower dose of lasix. Sugar was 71 this morning; all running fine, no hypoglycemic symptoms.  PMH, PSH, SH reviewed  Outpatient Encounter Prescriptions as of 07/15/2016  Medication Sig Note  . ACCU-CHEK FASTCLIX LANCETS MISC TEST TWO TIMES DAILY   . ACCU-CHEK SMARTVIEW test strip USE TWICE DAILY   . allopurinol (ZYLOPRIM) 100 MG tablet Take 1 tablet (100 mg total) by mouth daily.   Marland Kitchen amLODipine (NORVASC) 10 MG tablet Take 1 tablet (10 mg total) by mouth daily.   Marland Kitchen aspirin 81 MG tablet Take 81 mg by mouth daily.     Marland Kitchen atorvastatin (LIPITOR) 40 MG tablet TAKE 1 TABLET (40 MG TOTAL) BY MOUTH DAILY.   . BD INSULIN SYRINGE ULTRAFINE 31G X 5/16" 0.5 ML MISC USE  1 EVERY DAY   . Blood Glucose Monitoring Suppl (ACCU-CHEK NANO SMARTVIEW) w/Device KIT 1 each by Does not apply route 2 (two) times daily. BID   . Cholecalciferol (VITAMIN D) 1000 UNITS capsule Take 1,000 Units by mouth daily.     . enalapril (VASOTEC) 10 MG tablet Take 1 tablet (10 mg total) by mouth daily.   . furosemide (LASIX) 40 MG tablet Take 40 mg by mouth daily.  04/15/2016: Taking 55m daily  . LANTUS 100 UNIT/ML injection INJECT 25 UNITS  INTO THE SKIN AT BEDTIME (DISCARD AND BEGIN A NEW VIAL EVERY 28 DAYS)   . latanoprost (XALATAN) 0.005 % ophthalmic solution Place 1 drop into both eyes at bedtime.     . metFORMIN (GLUCOPHAGE) 850 MG tablet Take 1 tablet (850 mg total) by mouth 2 (two) times daily with a meal.   . pioglitazone (ACTOS) 30 MG tablet Take 1 tablet (30 mg total) by mouth daily.   . vitamin B-12 (CYANOCOBALAMIN) 100 MCG tablet Take 100 mcg by mouth daily.     .Marland Kitchen acetaminophen (TYLENOL) 500 MG tablet Take 1,000 mg by mouth every 6 (six) hours as needed for mild pain. Reported on 09/24/2015   . colchicine 0.6 MG tablet TAKE ONE TO TWO TABLETS BY MOUTH ONCE DAILY AS NEEDED FOR  GOUT (Patient not taking: Reported on 07/15/2016)    No facility-administered encounter medications on file as of 07/15/2016.    No Known Allergies  ROS: no headaches, dizziness, chest pain, palpitations, shortness of breath, edema, URI symptoms, GI complaints or other concerns.  PHYSICAL EXAM:  BP 130/70 (BP Location: Left Arm, Patient Position: Sitting, Cuff Size: Normal)   Pulse 88   Ht 5' 1.5" (1.562 m)   Wt 280 lb 3.2 oz (127.1 kg)   LMP 06/14/1998 (Approximate)   BMI 52.09 kg/m   Pleasant, obese female in no distress.  Sitting comfortably in chair.  When asked to get onto the examination table, this was very difficult for her, she struggled, and was breathing a little harder/faster.  Tachycardic after working hard to get up on the exam table. Sounded regular.  As her heart rate slowed down, it was clear that she had bigeminy. No murmur, rub, gallop. Lungs clear bilaterally Extremities: no edema Neuro: alert and oriented, cranial nerves intact. Normal gait  Psych: normal mood, affect, hygiene and grooming.  EKG--NSR with frequent PAC's--bigeminy. See scanned EKG and rhythm strip.  No ischemia or acute abnormalities.  ASSESSMENT/PLAN:    Addendum--Cr back up again.  Advised to stop metformin, and instead titrate up the dose of insulin to keep sugars controlled.

## 2016-07-19 LAB — IPS PAP TEST WITH HPV

## 2016-07-20 DIAGNOSIS — H401131 Primary open-angle glaucoma, bilateral, mild stage: Secondary | ICD-10-CM | POA: Diagnosis not present

## 2016-07-20 DIAGNOSIS — H353131 Nonexudative age-related macular degeneration, bilateral, early dry stage: Secondary | ICD-10-CM | POA: Diagnosis not present

## 2016-07-20 DIAGNOSIS — E119 Type 2 diabetes mellitus without complications: Secondary | ICD-10-CM | POA: Diagnosis not present

## 2016-07-20 DIAGNOSIS — H04123 Dry eye syndrome of bilateral lacrimal glands: Secondary | ICD-10-CM | POA: Diagnosis not present

## 2016-07-20 DIAGNOSIS — Z961 Presence of intraocular lens: Secondary | ICD-10-CM | POA: Diagnosis not present

## 2016-07-20 LAB — HM DIABETES EYE EXAM

## 2016-07-22 ENCOUNTER — Encounter: Payer: Self-pay | Admitting: *Deleted

## 2016-07-28 NOTE — Progress Notes (Signed)
Chief Complaint  Patient presents with  . Diabetes    follow up on blood sugars. Stopped metformin and went up on lantus dose. Was unsure if she was supposed to stop taking actos so she did not-she is still taking it.     She was recently seen and diagnosed with bigeminy.  She was referred to cardiology, and has appointment scheduled later this month. She denies any cardiac symptoms (chest pain, palpitations, DOE, shortness of breath).  We checked chem panel as part of evaluation at her recent visit, and worsening Cr was noted.  Metformin was stopped, and recommended titrating up insulin dose instead. She presents today to follow up on her diabetes.  She has been trying to exercise and eat a little less, and healthier.  Cut back on fried foods.     Chemistry      Component Value Date/Time   NA 140 07/15/2016 1423   K 4.6 07/15/2016 1423   CL 107 07/15/2016 1423   CO2 22 07/15/2016 1423   BUN 24 07/15/2016 1423   CREATININE 1.60 (H) 07/15/2016 1423      Component Value Date/Time   CALCIUM 9.0 07/15/2016 1423   ALKPHOS 69 03/29/2016 1335   AST 13 03/29/2016 1335   ALT 10 03/29/2016 1335   BILITOT 0.4 03/29/2016 1335      Lantus dose prior to stopping metformin was 25 U QHS. She had a little misunderstanding in the titration instructions. She took 27 U for 3 days, then took 25 for 1 day, then back to 27 U for 3 days.   Sugars are running 71-131 (mostly <100) in the mornings, 78-153 before dinner. Denies any hypoglycemic symptoms  PMH, PSH, SH reviewed  Outpatient Encounter Prescriptions as of 07/29/2016  Medication Sig Note  . ACCU-CHEK FASTCLIX LANCETS MISC TEST TWO TIMES DAILY   . ACCU-CHEK SMARTVIEW test strip USE TWICE DAILY   . allopurinol (ZYLOPRIM) 100 MG tablet Take 1 tablet (100 mg total) by mouth daily.   Marland Kitchen amLODipine (NORVASC) 10 MG tablet Take 1 tablet (10 mg total) by mouth daily.   Marland Kitchen aspirin 81 MG tablet Take 81 mg by mouth daily.     Marland Kitchen atorvastatin (LIPITOR) 40  MG tablet TAKE 1 TABLET (40 MG TOTAL) BY MOUTH DAILY.   . BD INSULIN SYRINGE ULTRAFINE 31G X 5/16" 0.5 ML MISC USE  1 EVERY DAY   . Blood Glucose Monitoring Suppl (ACCU-CHEK NANO SMARTVIEW) w/Device KIT 1 each by Does not apply route 2 (two) times daily. BID   . Cholecalciferol (VITAMIN D) 1000 UNITS capsule Take 1,000 Units by mouth daily.     . enalapril (VASOTEC) 10 MG tablet Take 1 tablet (10 mg total) by mouth daily.   . furosemide (LASIX) 40 MG tablet Take 40 mg by mouth daily.  04/15/2016: Taking 36m daily  . LANTUS 100 UNIT/ML injection INJECT 25 UNITS  INTO THE SKIN AT BEDTIME (DISCARD AND BEGIN A NEW VIAL EVERY 28 DAYS) (Patient taking differently: 27 units)   . latanoprost (XALATAN) 0.005 % ophthalmic solution Place 1 drop into both eyes at bedtime.     . pioglitazone (ACTOS) 30 MG tablet Take 1 tablet (30 mg total) by mouth daily.   . vitamin B-12 (CYANOCOBALAMIN) 100 MCG tablet Take 100 mcg by mouth daily.     .Marland Kitchenacetaminophen (TYLENOL) 500 MG tablet Take 1,000 mg by mouth every 6 (six) hours as needed for mild pain. Reported on 09/24/2015   . colchicine 0.6 MG  tablet TAKE ONE TO TWO TABLETS BY MOUTH ONCE DAILY AS NEEDED FOR  GOUT (Patient not taking: Reported on 07/15/2016)   . metFORMIN (GLUCOPHAGE) 850 MG tablet Take 1 tablet (850 mg total) by mouth 2 (two) times daily with a meal. (Patient not taking: Reported on 07/29/2016)    No facility-administered encounter medications on file as of 07/29/2016.    No Known Allergies  ROS: no fever, chills, headaches, dizziness, chest pain, palpitations, shortness of breath, hypoglycemia, polydipsia, polyuria or other complaints.  PHYSICAL EXAM:  BP 140/70 (BP Location: Left Arm, Patient Position: Sitting, Cuff Size: Normal)   Pulse 96   Ht 5' 1.5" (1.562 m)   Wt 276 lb 3.2 oz (125.3 kg)   LMP 06/14/1998 (Approximate)   BMI 51.34 kg/m   Wt Readings from Last 3 Encounters:  07/29/16 276 lb 3.2 oz (125.3 kg)  07/15/16 280 lb 3.2 oz (127.1  kg)  07/14/16 279 lb (126.6 kg)   Pleasant, obese female in good spirits Alert and oriented, normal cranial nerves and gait Heart: regular rate and rhythm, no ectopy or bigeminy noted today Lungs: clear  Lab Results  Component Value Date   HGBA1C 6.6 07/29/2016   ASSESSMENT/PLAN:  Diabetes mellitus with stage 3 chronic kidney disease ([place])  Diabetes mellitus without complication ([place]) - Plan: HgB A1c    Your blood sugars overall look good. You had some frequent morning and evening sugars <100, and often <80 in the mornings. I think the 27 U is a little too much.   Decrease your Lantus dose back to 26 units every night.  If your blood sugars are consistently under 100 every morning, or frequently under 80, then you will need to further lower the dose (to 25 units, and possible down to 24 units if the sugars stay in that same range).  Continue the Actos (pioglitazone). Metformin was the only medication to be stopped (due to the bump in kidney function).  F/u as scheduled in April F/u as scheduled with cardiologist later this month.

## 2016-07-29 ENCOUNTER — Encounter: Payer: Self-pay | Admitting: Family Medicine

## 2016-07-29 ENCOUNTER — Ambulatory Visit (INDEPENDENT_AMBULATORY_CARE_PROVIDER_SITE_OTHER): Payer: Medicare HMO | Admitting: Family Medicine

## 2016-07-29 VITALS — BP 140/70 | HR 96 | Ht 61.5 in | Wt 276.2 lb

## 2016-07-29 DIAGNOSIS — E1122 Type 2 diabetes mellitus with diabetic chronic kidney disease: Secondary | ICD-10-CM | POA: Diagnosis not present

## 2016-07-29 DIAGNOSIS — N183 Chronic kidney disease, stage 3 unspecified: Secondary | ICD-10-CM

## 2016-07-29 DIAGNOSIS — E119 Type 2 diabetes mellitus without complications: Secondary | ICD-10-CM | POA: Diagnosis not present

## 2016-07-29 LAB — POCT GLYCOSYLATED HEMOGLOBIN (HGB A1C): HEMOGLOBIN A1C: 6.6

## 2016-07-29 NOTE — Patient Instructions (Signed)
  Your blood sugars overall look good. You had some frequent morning and evening sugars <100, and often <80 in the mornings. I think the 27 U is a little too much.   Decrease your Lantus dose back to 26 units every night.  If your blood sugars are consistently under 100 every morning, or frequently under 80, then you will need to further lower the dose (to 25 units, and possible down to 24 units if the sugars stay in that same range).  Continue the Actos (pioglitazone). Metformin was the only medication to be stopped (due to the bump in kidney function).  Your heartbeat was regular today. Keep the appointment with the cardiologist as scheduled for later this month.

## 2016-08-10 ENCOUNTER — Encounter: Payer: Self-pay | Admitting: Cardiology

## 2016-08-10 ENCOUNTER — Ambulatory Visit (INDEPENDENT_AMBULATORY_CARE_PROVIDER_SITE_OTHER): Payer: Medicare HMO | Admitting: Cardiology

## 2016-08-10 VITALS — BP 144/74 | HR 90 | Ht 62.0 in | Wt 278.2 lb

## 2016-08-10 DIAGNOSIS — I499 Cardiac arrhythmia, unspecified: Secondary | ICD-10-CM | POA: Insufficient documentation

## 2016-08-10 DIAGNOSIS — E1122 Type 2 diabetes mellitus with diabetic chronic kidney disease: Secondary | ICD-10-CM | POA: Diagnosis not present

## 2016-08-10 DIAGNOSIS — N183 Chronic kidney disease, stage 3 unspecified: Secondary | ICD-10-CM

## 2016-08-10 NOTE — Progress Notes (Signed)
Cardiology Office Note    Date:  08/10/2016   ID:  Kerri Richardson, DOB [birth year], MRN [id number]  PCP:  Vikki Ports, MD  Cardiologist:   Candee Furbish, MD   No chief complaint on file.   History of Present Illness:  Kerri Richardson is a 76 y.o. female here for the evaluation of irregular heart beats At the request of Rita Ohara M.D.   Rhythm strip 07/15/16 was previously performed and personally viewed and appears to be sinus tachycardia. I do not see any clear P waves buried within T-wave.  She was in a routine GYN follow-up visit with Dr. Quincy Simmonds who auscultated irregular heartbeat. She denies any palpitations, chest pain, shortness of breath, tachycardia.  Recently her metformin was discontinued because of creatinine ranging between 1.8 and 1.6.  Sister had MI CAD.   Overall she is not feeling any symptoms. She understands that some shortness of breath is associated with her morbid obesity.   Past Medical History:  Diagnosis Date  . Arthritis   . CKD (chronic kidney disease) stage 3, GFR 30-59 ml/min    Dr. [name]  . Diabetic retinopathy   . DM retinopathy ([place]) 08/17/12   early  . Essential hypertension, benign   . Glaucoma   . Gout   . Pure hypercholesterolemia   . Type II or unspecified type diabetes mellitus without mention of complication, not stated as uncontrolled     Past Surgical History:  Procedure Laterality Date  . BRAIN SURGERY  1969   Burr holes in skull to alleviate pressure on the brain (no shunt)  . CATARACT EXTRACTION, BILATERAL  2012   Dr. [name]  . COLONOSCOPY  3/05, 11/2013   due again 2020  . COLPOSCOPY N/A 05/27/2015   Procedure: COLPOSCOPY;  Surgeon: [name], MD;  Location: [facility];  Service: Gynecology;  Laterality: N/A;  . LEEP N/A 05/27/2015   Procedure: LOOP ELECTROSURGICAL EXCISION PROCEDURE (LEEP);  Surgeon: [name], MD;  Location: [facility];  Service: Gynecology;  Laterality: N/A;  . TONSILLECTOMY  age 73      Current Medications: Outpatient Medications Prior to Visit  Medication Sig Dispense Refill  . ACCU-CHEK FASTCLIX LANCETS MISC TEST TWO TIMES DAILY 204 each 2  . ACCU-CHEK SMARTVIEW test strip USE TWICE DAILY 200 each 2  . acetaminophen (TYLENOL) 500 MG tablet Take 1,000 mg by mouth every 6 (six) hours as needed for mild pain. Reported on 09/24/2015    . allopurinol (ZYLOPRIM) 100 MG tablet Take 1 tablet (100 mg total) by mouth daily. 90 tablet 1  . amLODipine (NORVASC) 10 MG tablet Take 1 tablet (10 mg total) by mouth daily. 90 tablet 1  . aspirin 81 MG tablet Take 81 mg by mouth daily.      Marland Kitchen atorvastatin (LIPITOR) 40 MG tablet TAKE 1 TABLET (40 MG TOTAL) BY MOUTH DAILY. 90 tablet 0  . BD INSULIN SYRINGE ULTRAFINE 31G X 5/16" 0.5 ML MISC USE  1 EVERY DAY 90 each 3  . Blood Glucose Monitoring Suppl (ACCU-CHEK NANO SMARTVIEW) w/Device KIT 1 each by Does not apply route 2 (two) times daily. BID 1 kit 0  . Cholecalciferol (VITAMIN D) 1000 UNITS capsule Take 1,000 Units by mouth daily.      . colchicine 0.6 MG tablet TAKE ONE TO TWO TABLETS BY MOUTH ONCE DAILY AS NEEDED FOR  GOUT 30 tablet 0  . enalapril (VASOTEC) 10 MG tablet Take 1 tablet (10  mg total) by mouth daily. 90 tablet 1  . furosemide (LASIX) 40 MG tablet Take 40 mg by mouth daily.     Marland Kitchen LANTUS 100 UNIT/ML injection INJECT 25 UNITS  INTO THE SKIN AT BEDTIME (DISCARD AND BEGIN A NEW VIAL EVERY 28 DAYS) (Patient taking differently: 27 units) 30 mL 1  . latanoprost (XALATAN) 0.005 % ophthalmic solution Place 1 drop into both eyes at bedtime.      . pioglitazone (ACTOS) 30 MG tablet Take 1 tablet (30 mg total) by mouth daily. 90 tablet 1  . vitamin B-12 (CYANOCOBALAMIN) 100 MCG tablet Take 100 mcg by mouth daily.      . metFORMIN (GLUCOPHAGE) 850 MG tablet Take 1 tablet (850 mg total) by mouth 2 (two) times daily with a meal. (Patient not taking: Reported on 07/29/2016) 180 tablet 1   No facility-administered medications prior to visit.       Allergies:   Patient has no known allergies.   Social History   Social History  . Marital status: Single    Spouse name: N/A  . Number of children: N/A  . Years of education: N/A   Social History Main Topics  . Smoking status: Former Smoker    Quit date: 06/14/2000  . Smokeless tobacco: Never Used  . Alcohol use No  . Drug use: No  . Sexual activity: No   Other Topics Concern  . None   Social History Narrative   Worked at [employer], retired 12/2013. Lives alone.  She has a sister in [place].  No children. Her God-son lives in [place]     Family History:  The patient's family history includes Breast cancer (age of onset: 67) in her maternal aunt; Cancer in her mother; Diabetes in her father, paternal grandmother, and sister; Heart disease in her sister; Hypertension in her father, mother, sister, and sister; Stroke in her father.   ROS:   Please see the history of present illness.    ROS All other systems reviewed and are negative.   PHYSICAL EXAM:   VS:  BP (!) 144/74   Pulse 90   Ht _0  (1.575 m)   Wt 278 lb 3.2 oz (126.2 kg)   LMP 06/14/1998 (Approximate)   SpO2 97%   BMI 50.88 kg/m    GEN: Well nourished, well developed, in no acute distress  HEENT: normal  Neck: no JVD, carotid bruits, or masses Cardiac: RRR; no murmurs, rubs, or gallops,no edema (For a brief moment her heart rate did transition for a few beats then went back to her current rate.) Respiratory:  clear to auscultation bilaterally, normal work of breathing GI: soft, nontender, nondistended, + BS MS: no deformity or atrophy  Skin: warm and dry, no rash Neuro:  Alert and Oriented x 3, Strength and sensation are intact Psych: euthymic mood, full affect  Wt Readings from Last 3 Encounters:  08/10/16 278 lb 3.2 oz (126.2 kg)  07/29/16 276 lb 3.2 oz (125.3 kg)  07/15/16 280 lb 3.2 oz (127.1 kg)      Studies/Labs Reviewed:   EKG:  EKG and rhythm strip from 07/15/16 personally reviewed  demonstrating sinus tachycardia. I do not see any evidence of clear P waves buried within the teas. R to R intervals are normal. Pattern not suggestive of bigeminy.  Recent Labs: 09/24/2015: TSH 4.15 03/29/2016: ALT 10; Hemoglobin 12.1; Platelets 261 07/15/2016: BUN 24; Creat 1.60; Potassium 4.6; Sodium 140   Lipid Panel    Component Value Date/Time  CHOL 192 03/29/2016 1335   TRIG 123 03/29/2016 1335   HDL 45 (L) 03/29/2016 1335   CHOLHDL 4.3 03/29/2016 1335   VLDL 25 03/29/2016 1335   LDLCALC 122 03/29/2016 1335    Additional studies/ records that were reviewed today include:  Prior EKGs, lab work, office notes reviewed    ASSESSMENT:    1. Diabetes mellitus with stage 3 chronic kidney disease ([place])   2. Irregular heartbeat   3. CKD (chronic kidney disease), stage III   4. Morbid obesity ([place])      PLAN:  In order of problems listed above:  Irregular heartbeat  - Previous EKG is reassuring. No evidence of atrial flutter or fibrillation. No evidence of PACs noted.  - Reassurance.  - No further cardiac workup necessary.  - It does not appear that an EKG was performed at the time of service irregular heart beat was heard at GYN office visit. I do agree with Dr. Quincy Simmonds that an occasional additional beat is likely a PAC or PVC. Benign. She has no high risk symptoms such as syncope, angina.  - If symptoms change, she is to let me know.  Chronic kidney disease stage IIIB  - Creatinine of 1.6 -  GFR 32.  - She is now off of metformin.  Diabetes with renal manifestations  - Per primary team  - On ACE inhibitor for renal protection.  Morbid obesity  - Continue to encourage weight loss.  Medication Adjustments/Labs and Tests Ordered: Current medicines are reviewed at length with the patient today.  Concerns regarding medicines are outlined above.  Medication changes, Labs and Tests ordered today are listed in the Patient Instructions below. Patient Instructions  Medication  Instructions:  The current medical regimen is effective;  continue present plan and medications.  Follow-Up: Follow up as needed with Dr Marlou Porch.  Thank you for choosing [facility]!!        Signed, Candee Furbish, MD  08/10/2016 9:12 AM    [facility] [place], [place], [place]  [address] Phone: [phone]; Fax: [phone]

## 2016-08-10 NOTE — Patient Instructions (Signed)
Medication Instructions:  The current medical regimen is effective;  continue present plan and medications.  Follow-Up: Follow up as needed with Dr Skains.  Thank you for choosing [facility]!!     

## 2016-08-11 ENCOUNTER — Other Ambulatory Visit: Payer: Self-pay | Admitting: Family Medicine

## 2016-08-11 DIAGNOSIS — N183 Chronic kidney disease, stage 3 unspecified: Secondary | ICD-10-CM

## 2016-08-11 DIAGNOSIS — E1122 Type 2 diabetes mellitus with diabetic chronic kidney disease: Secondary | ICD-10-CM

## 2016-08-11 DIAGNOSIS — I10 Essential (primary) hypertension: Secondary | ICD-10-CM

## 2016-08-18 ENCOUNTER — Other Ambulatory Visit: Payer: Self-pay | Admitting: Family Medicine

## 2016-08-30 ENCOUNTER — Other Ambulatory Visit: Payer: Self-pay | Admitting: Family Medicine

## 2016-08-30 DIAGNOSIS — E1122 Type 2 diabetes mellitus with diabetic chronic kidney disease: Secondary | ICD-10-CM

## 2016-08-30 DIAGNOSIS — M109 Gout, unspecified: Secondary | ICD-10-CM

## 2016-08-30 DIAGNOSIS — N183 Chronic kidney disease, stage 3 unspecified: Secondary | ICD-10-CM
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
## 2016-09-27 ENCOUNTER — Encounter: Payer: Self-pay | Admitting: Family Medicine

## 2016-09-27 ENCOUNTER — Ambulatory Visit (INDEPENDENT_AMBULATORY_CARE_PROVIDER_SITE_OTHER): Payer: Medicare HMO | Admitting: Family Medicine

## 2016-09-27 VITALS — BP 138/70 | HR 88 | Ht 62.0 in | Wt 275.6 lb

## 2016-09-27 DIAGNOSIS — E782 Mixed hyperlipidemia: Secondary | ICD-10-CM

## 2016-09-27 DIAGNOSIS — Z6841 Body Mass Index (BMI) 40.0 and over, adult: Secondary | ICD-10-CM

## 2016-09-27 DIAGNOSIS — I10 Essential (primary) hypertension: Secondary | ICD-10-CM | POA: Diagnosis not present

## 2016-09-27 DIAGNOSIS — E1122 Type 2 diabetes mellitus with diabetic chronic kidney disease: Secondary | ICD-10-CM | POA: Diagnosis not present

## 2016-09-27 DIAGNOSIS — N183 Chronic kidney disease, stage 3 (moderate): Secondary | ICD-10-CM | POA: Diagnosis not present

## 2016-09-27 DIAGNOSIS — M109 Gout, unspecified: Secondary | ICD-10-CM | POA: Diagnosis not present

## 2016-09-27 DIAGNOSIS — Z5181 Encounter for therapeutic drug level monitoring: Secondary | ICD-10-CM

## 2016-09-27 LAB — COMPREHENSIVE METABOLIC PANEL
ALBUMIN: 4.1 g/dL (ref 3.6–5.1)
ALT: 9 U/L (ref 6–29)
AST: 12 U/L (ref 10–35)
Alkaline Phosphatase: 76 U/L (ref 33–130)
BILIRUBIN TOTAL: 0.4 mg/dL (ref 0.2–1.2)
BUN: 32 mg/dL — ABNORMAL HIGH (ref 7–25)
CALCIUM: 9.1 mg/dL (ref 8.6–10.4)
CHLORIDE: 104 mmol/L (ref 98–110)
CO2: 23 mmol/L (ref 20–31)
Creat: 1.41 mg/dL — ABNORMAL HIGH (ref 0.60–0.93)
Glucose, Bld: 98 mg/dL (ref 65–99)
POTASSIUM: 4.7 mmol/L (ref 3.5–5.3)
Sodium: 138 mmol/L (ref 135–146)
Total Protein: 6.8 g/dL (ref 6.1–8.1)

## 2016-09-27 LAB — LIPID PANEL
CHOLESTEROL: 162 mg/dL (ref <200)
HDL: 40 mg/dL — AB (ref >50)
LDL Cholesterol: 98 mg/dL (ref <100)
Total CHOL/HDL Ratio: 4.1 Ratio (ref <5.0)
Triglycerides: 121 mg/dL (ref <150)
VLDL: 24 mg/dL (ref <30)

## 2016-09-27 NOTE — Patient Instructions (Addendum)
I recommend getting the new shingles vaccine (Shingrix). You will need to check with your insurance to see if it is covered, and if covered by Medicare Part D, you need to get from the pharmacy rather than our office.  It is a series of 2 injections, spaced 2 months apart.   Try and get at least 150 minutes of aerobic activity each week. Consider restarting water aerobics, since that won't bother your knee as much as walking.  Silver Sneakers is still a good idea--you may need to talk to the instructor about modifications if your knee bothers you with a particular exercise. Continue to eat healthy--avoiding the fried foods, juices, sugared beverages, and continuing to follow the Weight Watchers guidelines--not just with their frozen foods, but in counting points and measuring portions for other foods (ie when out, or cooking your own.).  Continue your current medications. We will contact you later this week with your test results.

## 2016-09-28 LAB — URIC ACID: URIC ACID, SERUM: 7 mg/dL (ref 2.5–7.0)

## 2016-10-13 ENCOUNTER — Other Ambulatory Visit: Payer: Self-pay | Admitting: Family Medicine

## 2016-10-13 DIAGNOSIS — E1122 Type 2 diabetes mellitus with diabetic chronic kidney disease: Secondary | ICD-10-CM

## 2016-10-13 DIAGNOSIS — N183 Chronic kidney disease, stage 3 (moderate): Principal | ICD-10-CM

## 2016-10-13 DIAGNOSIS — I10 Essential (primary) hypertension: Secondary | ICD-10-CM

## 2016-10-20 ENCOUNTER — Other Ambulatory Visit: Payer: Self-pay | Admitting: Family Medicine

## 2016-10-26 DIAGNOSIS — I10 Essential (primary) hypertension: Secondary | ICD-10-CM | POA: Diagnosis not present

## 2016-10-26 DIAGNOSIS — N183 Chronic kidney disease, stage 3 (moderate): Secondary | ICD-10-CM | POA: Diagnosis not present

## 2016-10-26 DIAGNOSIS — I129 Hypertensive chronic kidney disease with stage 1 through stage 4 chronic kidney disease, or unspecified chronic kidney disease: Secondary | ICD-10-CM | POA: Diagnosis not present

## 2016-11-01 ENCOUNTER — Other Ambulatory Visit: Payer: Self-pay | Admitting: Family Medicine

## 2016-11-01 DIAGNOSIS — N183 Chronic kidney disease, stage 3 unspecified: Secondary | ICD-10-CM

## 2016-11-01 DIAGNOSIS — M109 Gout, unspecified: Secondary | ICD-10-CM

## 2016-11-01 DIAGNOSIS — E1122 Type 2 diabetes mellitus with diabetic chronic kidney disease: Secondary | ICD-10-CM

## 2016-11-02 NOTE — Telephone Encounter (Signed)
Is this okay to refill? 

## 2017-01-03 ENCOUNTER — Telehealth: Payer: Self-pay | Admitting: Family Medicine

## 2017-01-03 DIAGNOSIS — N183 Chronic kidney disease, stage 3 unspecified: Secondary | ICD-10-CM

## 2017-01-03 DIAGNOSIS — E1122 Type 2 diabetes mellitus with diabetic chronic kidney disease: Secondary | ICD-10-CM

## 2017-01-03 MED ORDER — INSULIN GLARGINE 100 UNIT/ML ~~LOC~~ SOLN: SUBCUTANEOUS | 1 refills | Status: DC

## 2017-01-03 NOTE — Telephone Encounter (Signed)
Pt has an appt in December 

## 2017-01-03 NOTE — Telephone Encounter (Signed)
[facility]

## 2017-01-05 ENCOUNTER — Other Ambulatory Visit: Payer: Self-pay | Admitting: Family Medicine

## 2017-01-05 DIAGNOSIS — N183 Chronic kidney disease, stage 3 unspecified: Secondary | ICD-10-CM

## 2017-01-05 DIAGNOSIS — E1122 Type 2 diabetes mellitus with diabetic chronic kidney disease: Secondary | ICD-10-CM

## 2017-02-03 DIAGNOSIS — Z961 Presence of intraocular lens: Secondary | ICD-10-CM | POA: Diagnosis not present

## 2017-02-03 DIAGNOSIS — H04123 Dry eye syndrome of bilateral lacrimal glands: Secondary | ICD-10-CM | POA: Diagnosis not present

## 2017-02-03 DIAGNOSIS — H401131 Primary open-angle glaucoma, bilateral, mild stage: Secondary | ICD-10-CM | POA: Diagnosis not present

## 2017-02-21 ENCOUNTER — Other Ambulatory Visit: Payer: Self-pay | Admitting: Family Medicine

## 2017-02-21 DIAGNOSIS — N183 Chronic kidney disease, stage 3 unspecified: Secondary | ICD-10-CM

## 2017-02-21 DIAGNOSIS — E1122 Type 2 diabetes mellitus with diabetic chronic kidney disease: Secondary | ICD-10-CM

## 2017-02-21 DIAGNOSIS — I10 Essential (primary) hypertension: Secondary | ICD-10-CM

## 2017-03-14 ENCOUNTER — Other Ambulatory Visit: Payer: Self-pay | Admitting: Family Medicine

## 2017-03-14 DIAGNOSIS — M109 Gout, unspecified: Secondary | ICD-10-CM

## 2017-03-14 DIAGNOSIS — E1122 Type 2 diabetes mellitus with diabetic chronic kidney disease: Secondary | ICD-10-CM

## 2017-03-14 DIAGNOSIS — N183 Chronic kidney disease, stage 3 unspecified: Secondary | ICD-10-CM

## 2017-04-23 IMAGING — CR DG FOOT COMPLETE 3+V*R*
3 series · 3 of 3 positions shown · non-contrast
Comparison: None.

CLINICAL DATA: Right foot pain for 10 days particularly in the
great toe, no trauma

EXAM:
RIGHT FOOT COMPLETE - 3+ VIEW

[x foot ap right]
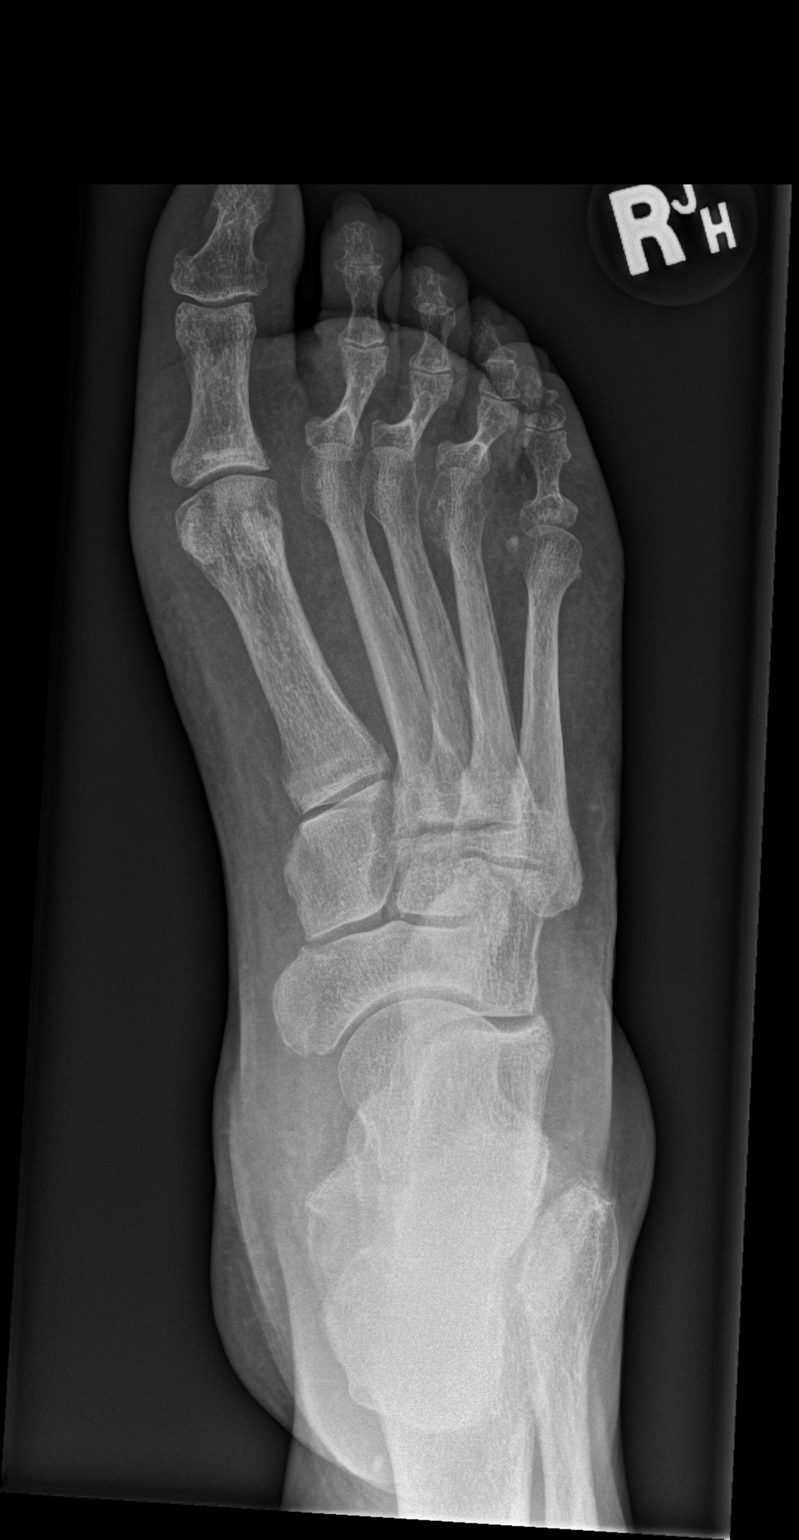

[x foot obl right]
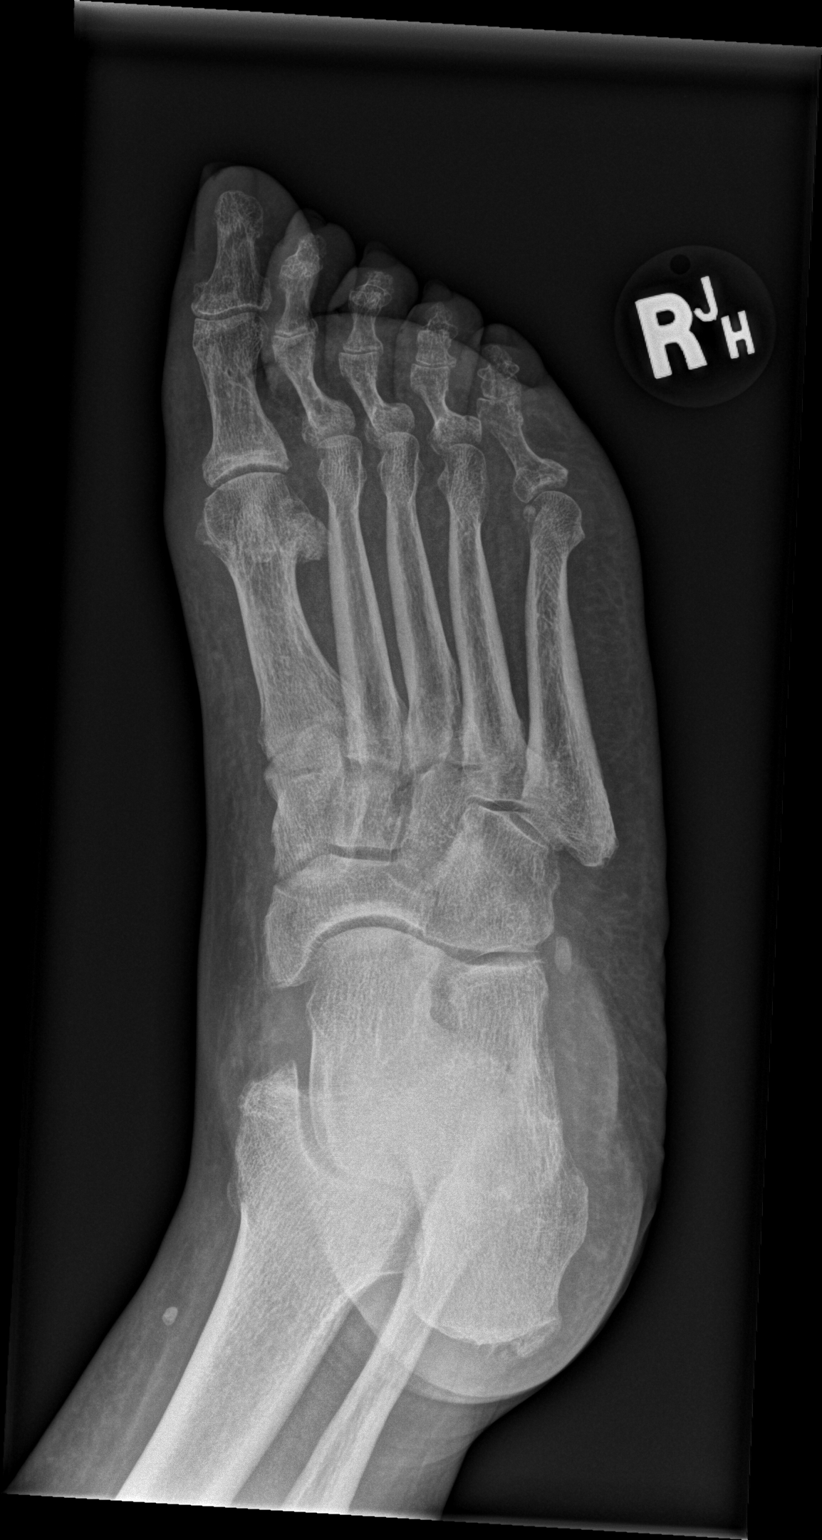

[x foot lat right]
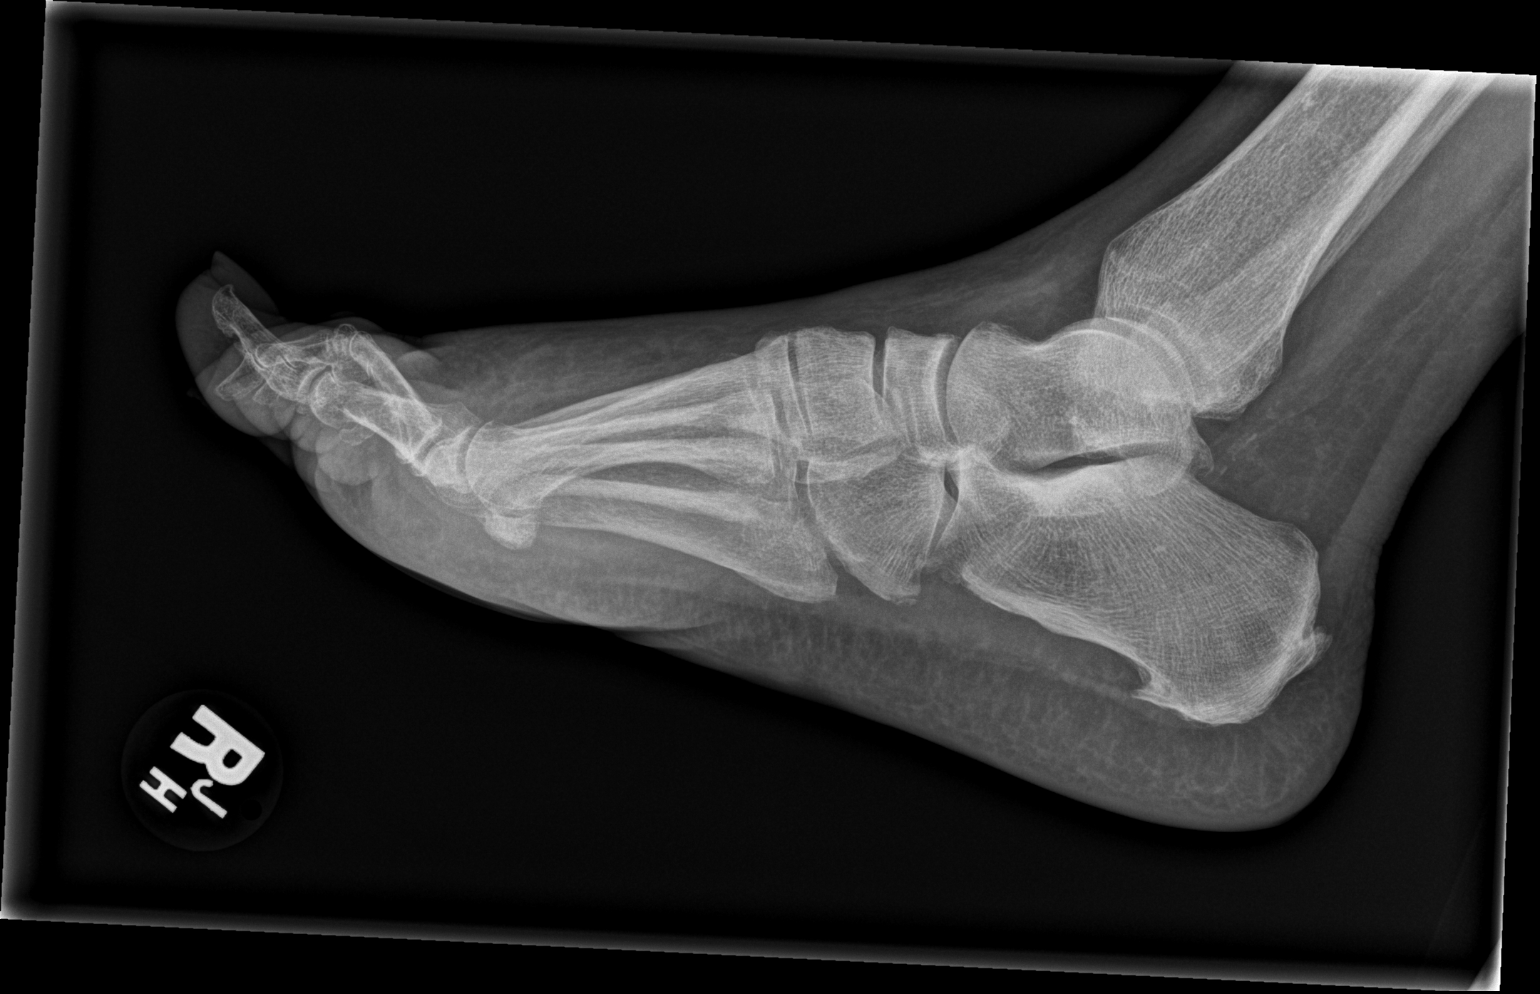

[3 of 3 positions shown; findings below may reference images not displayed]

FINDINGS: The bones are somewhat osteopenic. There is only mild degenerative
joint disease involving the right first MTP joint. No erosion is
seen. Tarsal-metatarsal alignment is normal. There are calcaneal
degenerative spur is present.
IMPRESSION: No acute abnormality. Mild degenerative change of the right first
MTP joint with degenerative calcaneal spurs present as well.

## 2017-04-27 ENCOUNTER — Other Ambulatory Visit: Payer: Self-pay | Admitting: Family Medicine

## 2017-04-27 DIAGNOSIS — I10 Essential (primary) hypertension: Secondary | ICD-10-CM

## 2017-04-27 DIAGNOSIS — N183 Chronic kidney disease, stage 3 (moderate): Principal | ICD-10-CM

## 2017-04-27 DIAGNOSIS — E1122 Type 2 diabetes mellitus with diabetic chronic kidney disease: Secondary | ICD-10-CM

## 2017-05-10 ENCOUNTER — Other Ambulatory Visit: Payer: Self-pay | Admitting: Family Medicine

## 2017-05-10 DIAGNOSIS — E119 Type 2 diabetes mellitus without complications: Secondary | ICD-10-CM

## 2017-05-15 NOTE — Progress Notes (Signed)
Chief Complaint  Patient presents with  . Medicare Wellness    fasting AWV no pap-sees KerriSilva. No concerns.     Kerri Richardson is a 76 y.o. female who presents for annual wellness visit and follow-up on chronic medical conditions.    She reports assymmetry to her stomach, more swollen on the right. Wonders if it is related to her insulin injections. She changed locations, has been giving in arm rather than abdomen.  Diabetes follow-up: Metformin was stopped in February related to elevated creatinine.  Lantus dose was titrated up, current dose is 27 Units, has been at this dose since last visit. Blood sugars are running 70's-100 in the morning, hasn't been checking later in the day. Denies hypoglycemia. Denies polydipsia and polyuria (voids frequently at night).  Last eye exam was 07/2016, no retinopathy. Patient follows a low sugar diet and checks feet regularly without concerns.  Lab Results  Component Value Date   HGBA1C 6.6 07/29/2016    Hyperlipidemia follow-up: Patient is reportedly following a low-fat, low cholesterol diet. Compliant with medications and denies medication side effects. Dose of atorvastatin was increased from 20 to 12m in October 2017.   Lab Results  Component Value Date   CHOL 162 09/27/2016   HDL 40 (L) 09/27/2016   LDLCALC 98 09/27/2016   TRIG 121 09/27/2016   CHOLHDL 4.1 09/27/2016    Obesity:  She now watches a 637month old baby during the day, so [facility].  No regular exercise. Her right knee sometimes bothers her (which has limited her walking in the past).  Previously saw nutritionist in October 2016.  Has been successful with Weight Watchers in the past, but regains weight when she stops going. Stops due to cost. She has been trying to follow the [employer](buying the meals), but stopped when she didn't see any weight loss.  Hypertension follow-up: Blood pressure is not checked elsewhere. Denies dizziness,  headaches, chest pain. Denies side effects of medications.  H/o toe pain--gout and degenerative changes. On allopurinol for prevention, with colchicine for prn use. She doesn't have any colchicine left. Only on 1013mof allopurinol (dose not increased due to impaired Cr and lack of gout symptoms on this dose).  She thinks she had some pain in her right great toe last week, better now. Denies changes in her diet. This was the first time she had any pain in a long time. Lab Results  Component Value Date   LABURIC 7.0 09/27/2016   CKD--sees Dr. PoFlorene Glenearly,. Denies any edema, even since cutting dose of lasix in 1/2 to 2053m   Immunization History  Administered Date(s) Administered  . Influenza Split 05/05/2011, 03/23/2012  . Influenza, High Dose Seasonal PF 04/03/2014, 02/20/2015, 03/29/2016  . Pneumococcal Conjugate-13 12/10/2013  . Pneumococcal Polysaccharide-23 01/26/2010  . Tdap 10/19/2007  . Zoster 10/19/2007   Last Pap smear: 06/2016, normal, no high risk HPV (by Dr. SilQuincy SimmondsS/p LEEP for +HR HPV, final pathology [facility] in 05/2015 with Dr. SilQuincy Simmondsec yearly paps (with cotesting) for at least 2 years).  Scheduled with Dr. SilQuincy Richardson 07/15/17. Last mammogram: 04/2016 Last colonoscopy: 11/26/13, +polyp, due again in 5 years Last DEXA: 01/2010--normal Ophtho: twice yearly, (+ retinopathy) Dentist: has dentures, and just one tooth; last seen 02/2013 (got new dentures) Exercise: very limited.   Other doctors caring for patient include: Ophtho: Dr. GroKaty Fitchphro: Dr. PowFlorene Richardson: Dr. ManCollene MaresN: Dr. SilQuincy Simmondstho: Dr. BeaTonita Richardson  Depression screen: Negative Fall screen: negative Functional Status Survery: Notable for some decrease in hearing, not worse than last year See full screens in epic  End of Life Discussion: Patient does not havea living will and medical power of attorney. Plans to ask her God-son. She forgot all about it, but plans to address. She still has the forms   Past  Medical History:  Diagnosis Date  . Arthritis   . CKD (chronic kidney disease) stage 3, GFR 30-59 ml/min (HCC)    Dr. Florene Richardson  . Diabetic retinopathy   . DM retinopathy ([place]) 08/17/12   early  . Essential hypertension, benign   . Glaucoma   . Gout   . Pure hypercholesterolemia   . Type II or unspecified type diabetes mellitus without mention of complication, not stated as uncontrolled     Past Surgical History:  Procedure Laterality Date  . BRAIN SURGERY  1969   Burr holes in skull to alleviate pressure on the brain (no shunt)  . CATARACT EXTRACTION, BILATERAL  2012   Kerri Richardson  . COLONOSCOPY  3/05, 11/2013   due again 2020  . COLPOSCOPY N/A 05/27/2015   Procedure: COLPOSCOPY;  Surgeon: Kerri Cobbs, MD;  Location: [facility];  Service: Gynecology;  Laterality: N/A;  . LEEP N/A 05/27/2015   Procedure: LOOP ELECTROSURGICAL EXCISION PROCEDURE (LEEP);  Surgeon: Kerri Cobbs, MD;  Location: [facility];  Service: Gynecology;  Laterality: N/A;  . TONSILLECTOMY  age 63    Social History   Socioeconomic History  . Marital status: Single    Spouse name: Not on file  . Number of children: Not on file  . Years of education: Not on file  . Highest education level: Not on file  Social Needs  . Financial resource strain: Not on file  . Food insecurity - worry: Not on file  . Food insecurity - inability: Not on file  . Transportation needs - medical: Not on file  . Transportation needs - non-medical: Not on file  Occupational History  . Not on file  Tobacco Use  . Smoking status: Former Smoker    Last attempt to quit: 06/14/2000    Years since quitting: 16.9  . Smokeless tobacco: Never Used  Substance and Sexual Activity  . Alcohol use: No    Alcohol/week: 0.0 oz  . Drug use: No  . Sexual activity: No    Partners: Male    Birth control/protection: Post-menopausal  Other Topics Concern  . Not on file  Social History Narrative   Worked at [employer], retired  12/2013. Lives alone.  She has a sister in [place].  No children. Her God-son lives in [place].   Babysitting during the day    Family History  Problem Relation Age of Onset  . Hypertension Mother   . Cancer Mother        lung (nonsmoker)  . Stroke Father   . Hypertension Father   . Diabetes Father   . Hypertension Sister   . Diabetes Sister   . Heart disease Sister   . Hypertension Sister   . Breast cancer Maternal Aunt 80       36 of old age  . Diabetes Paternal Grandmother     Outpatient Encounter Medications as of 05/16/2017  Medication Sig Note  . ACCU-CHEK FASTCLIX LANCETS MISC TEST TWO TIMES DAILY   . ACCU-CHEK SMARTVIEW test strip TEST TWICE DAILY   . allopurinol (ZYLOPRIM) 100 MG tablet TAKE  1 TABLET (100 MG TOTAL) BY MOUTH DAILY.   Marland Kitchen amLODipine (NORVASC) 10 MG tablet Take 1 tablet (10 mg total) by mouth daily.   Marland Kitchen aspirin 81 MG tablet Take 81 mg by mouth daily.     Marland Kitchen atorvastatin (LIPITOR) 40 MG tablet TAKE 1 TABLET EVERY DAY   . BD INSULIN SYRINGE ULTRAFINE 31G X 5/16" 0.5 ML MISC USE EVERY DAY   . Blood Glucose Monitoring Suppl (ACCU-CHEK NANO SMARTVIEW) w/Device KIT 1 each by Does not apply route 2 (two) times daily. BID   . Cholecalciferol (VITAMIN D) 1000 UNITS capsule Take 1,000 Units by mouth daily.     . enalapril (VASOTEC) 10 MG tablet TAKE 1 TABLET EVERY DAY   . furosemide (LASIX) 40 MG tablet Take 40 mg by mouth daily.  04/15/2016: Taking 51m daily  . insulin glargine (LANTUS) 100 UNIT/ML injection Inject 27 units every night at bedtime   . latanoprost (XALATAN) 0.005 % ophthalmic solution Place 1 drop into both eyes at bedtime.     . pioglitazone (ACTOS) 30 MG tablet TAKE 1 TABLET EVERY DAY   . vitamin B-12 (CYANOCOBALAMIN) 100 MCG tablet Take 100 mcg by mouth daily.     .Marland Kitchenacetaminophen (TYLENOL) 500 MG tablet Take 1,000 mg by mouth every 6 (six) hours as needed for mild pain. Reported on 09/24/2015   . colchicine 0.6 MG tablet TAKE ONE TO TWO TABLETS BY MOUTH ONCE  DAILY AS NEEDED FOR  GOUT (Patient not taking: Reported on 09/27/2016) 05/16/2017: Doesn't have any left   No facility-administered encounter medications on file as of 05/16/2017.     No Known Allergies   ROS: The patient denies anorexia, fever, headaches, vision changes, ear pain, sore throat, breast concerns, chest pain, palpitations, dizziness, syncope, dyspnea on exertion, cough, nausea, vomiting, diarrhea, constipation, abdominal pain, melena, hematochezia, indigestion/heartburn, hematuria, incontinence, dysuria, vaginal bleeding, discharge, odor or itch, genital lesions, joint pains (occasional right knee pain; slight right great toe pain last week, very mild); denies numbness, tingling, weakness, tremor, suspicious skin lesions, depression, anxiety, abnormal bleeding/bruising (some bruising, on aspirin), or enlarged lymph nodes. Right knee pain has improved, only occasionally flares, and relieved by tylenol prn. Has noted some hearing loss (Juliann Pulsesaid her TV is too loud), not significantly different over the last year. 10# weight gain since April. Some swelling in her feet only when she sits too long (in office today, infrequently at home).     PHYSICAL EXAM:  BP 130/70   Pulse 88   Ht '5\' 2"'  (1.575 m)   Wt 285 lb 6.4 oz (129.5 kg)   LMP 06/14/1998 (Approximate)   BMI 52.20 kg/m   Wt Readings from Last 3 Encounters:  05/16/17 285 lb 6.4 oz (129.5 kg)  09/27/16 275 lb 9.6 oz (125 kg)  08/10/16 278 lb 3.2 oz (126.2 kg)    General Appearance:   Alert, cooperative, no distress, appears stated age  Head:   Normocephalic, without obvious abnormality, atraumatic  Eyes:   PERRL, conjunctiva/corneas clear, EOM's intact, fundi not well visualized  Ears:   Normal TM's and external ear canals.   Nose:  Nares normal, mucosa normal, no drainage or sinus tenderness  Throat:  Lips, mucosa, and tongue normal; +dentures.  One tooth remaining on right upper jaw.  Neck:   Supple, no lymphadenopathy; thyroid: no enlargement/ tenderness/nodules; no carotid bruit or JVD  Back:  Spine nontender, no curvature, ROM normal, no CVAtenderness  Lungs:   Clear to auscultation bilaterally without  wheezes, rales orronchi; respirations unlabored  Chest Wall:   No tenderness or deformity  Heart:   Regular rate and rhythm, S1 and S2 normal, no murmur, rub or gallop  Breast Exam:   deferred to GYN  Abdomen:   Soft, obese, non-tender, nondistended, normoactive bowel sounds, no masses, no hepatosplenomegaly. obese.  No asymmetry noted when laying down.  When upright or with valsalva, very slight increase on right side noted; nontender.  Genitalia:   deferred to GYN  Rectal:   deferred to GYN  Extremities:  No clubbing, cyanosis. No edema. Normal sensation to light touch.  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, with long-term current use of insulin, macular edema presence unspecified, unspecified laterality, unspecified retinopathy severity ([place]) - Continue current meds; Discussed diet, exercise, weight loss - Plan: HgB A1c  Mixed hyperlipidemia - at goal per last check, due for repeat lipids - Plan: Lipid panel, Comprehensive metabolic panel  Diabetes mellitus with stage 3 chronic kidney disease ([place]) - Plan: Comprehensive metabolic panel, TSH, Microalbumin / creatinine urine ratio  Essential hypertension, benign -  controlled - Plan: Comprehensive metabolic panel  Gout involving toe of right foot, unspecified cause, unspecified chronicity - slight flare last week. refill colchicine for prn use. Check uric acid, may need allopurinol dose increased - Plan: Uric acid, colchicine 0.6 MG tablet  Obesity, morbid, BMI 50 or higher ([place]) - counseled extensively re: risks of obesity, diet, exercise, wt loss. Declines bariatric surgery eval. Will try and refer to wt loss clinic, discussed [employer]   Medication monitoring encounter - Plan: Lipid panel, Comprehensive metabolic panel, CBC with Differential/Platelet, Uric acid  Abdominal swelling, right lower quadrant - morbidly obese, limited exam--check [name] to evaluate underlying mass, hernia - Plan: US Abdomen Complete, US PELVIC COMPLETE WITH TRANSVAGINAL, CANCELED: US Pelvis Complete  Pelvic mass - rule out (with [name]) - Plan: US PELVIC COMPLETE WITH TRANSVAGINAL  c-met, uric acid, lipid, TSH, microalbumin, CBC  rec shingrix and Td (or Tdap) at pharmacy F/u as scheduled with Dr. Quincy Richardson for [facility] and pap in February Schedule mammogram  Refill colchicine for prn use.  May need allopurinol dose increased if uric acid level is elevated.  Discussed monthly self breast exams and yearly mammograms; at least 30 minutes of aerobic activity at least 5 days/week, weight-bearing exercise 2x/week; proper sunscreen use reviewed; healthy diet, including goals of calcium and vitamin D intake and alcohol recommendations (less than or equal to 1 drink/day) reviewed; regular seatbelt use; changing batteries in smoke detectors. Immunization recommendations discussed--continue yearly high dose flu shots. Tetanus booster due in 2019, to get at pharmacy. Shingrix recommended. Colonoscopy recommendations reviewed--due 11/2018.  Counseled re: obesity. Will try and refer to weight loss clinic--med may have some limitations due to kidney function, and cost. Not interested in surgery. Encouraged  restarting [employer] (paying for it)--to track ALL points for all meals, credit for exercise, and weekly weigh-ins for the added accountability.  She wasn't getting that when just buying [employer] food for a meal (and therefore  wasn't effective) Encouraged to consider surgical option.  She "has a bunch" of medication at home, declines refills today. Looks like pioglitazone, atorvastatin and allopurinol will be due for refills in January.     Full Code, Full Care. Discussed importance of Living Will and [place]. Advised to get [name] copies of completed forms.  F/u 6 months, sooner prn   Medicare Attestation I have personally reviewed: The patient's medical and social history Their use of alcohol, tobacco or illicit drugs Their current medications and supplements The patient's functional ability including ADLs,fall risks, home safety risks, cognitive, and hearing and visual impairment Diet and physical activities Evidence for depression or mood disorders  The patient's weight, height, and BMI have been recorded in the chart.  I have made referrals, counseling, and provided education to the patient based on review of the above and I have provided the patient with a written personalized care plan for preventive services.

## 2017-05-16 ENCOUNTER — Ambulatory Visit: Payer: Medicare HMO | Admitting: Family Medicine

## 2017-05-16 ENCOUNTER — Encounter: Payer: Self-pay | Admitting: Family Medicine

## 2017-05-16 ENCOUNTER — Other Ambulatory Visit: Payer: Self-pay | Admitting: Family Medicine

## 2017-05-16 VITALS — BP 130/70 | HR 88 | Ht 62.0 in | Wt 285.4 lb

## 2017-05-16 DIAGNOSIS — Z5181 Encounter for therapeutic drug level monitoring: Secondary | ICD-10-CM | POA: Diagnosis not present

## 2017-05-16 DIAGNOSIS — Z0000 Encounter for general adult medical examination without abnormal findings: Secondary | ICD-10-CM | POA: Diagnosis not present

## 2017-05-16 DIAGNOSIS — M109 Gout, unspecified: Secondary | ICD-10-CM | POA: Diagnosis not present

## 2017-05-16 DIAGNOSIS — N183 Chronic kidney disease, stage 3 unspecified: Secondary | ICD-10-CM

## 2017-05-16 DIAGNOSIS — Z794 Long term (current) use of insulin: Secondary | ICD-10-CM

## 2017-05-16 DIAGNOSIS — E11319 Type 2 diabetes mellitus with unspecified diabetic retinopathy without macular edema: Secondary | ICD-10-CM | POA: Diagnosis not present

## 2017-05-16 DIAGNOSIS — E1122 Type 2 diabetes mellitus with diabetic chronic kidney disease: Secondary | ICD-10-CM

## 2017-05-16 DIAGNOSIS — R1903 Right lower quadrant abdominal swelling, mass and lump: Secondary | ICD-10-CM | POA: Diagnosis not present

## 2017-05-16 DIAGNOSIS — R1900 Intra-abdominal and pelvic swelling, mass and lump, unspecified site: Secondary | ICD-10-CM | POA: Diagnosis not present

## 2017-05-16 DIAGNOSIS — E782 Mixed hyperlipidemia: Secondary | ICD-10-CM

## 2017-05-16 DIAGNOSIS — I10 Essential (primary) hypertension: Secondary | ICD-10-CM

## 2017-05-16 LAB — POCT GLYCOSYLATED HEMOGLOBIN (HGB A1C): HEMOGLOBIN A1C: 6.4

## 2017-05-16 MED ORDER — COLCHICINE 0.6 MG PO TABS: ORAL_TABLET | ORAL | 0 refills | Status: DC

## 2017-05-16 NOTE — Patient Instructions (Addendum)
HEALTH MAINTENANCE RECOMMENDATIONS:  It is recommended that you get at least 30 minutes of aerobic exercise at least 5 days/week (for weight loss, you may need as much as 60-90 minutes). This can be any activity that gets your heart rate up. This can be divided in 10-15 minute intervals if needed, but try and build up your endurance at least once a week.  Weight bearing exercise is also recommended twice weekly.  Eat a healthy diet with lots of vegetables, fruits and fiber.  "Colorful" foods have a lot of vitamins (ie green vegetables, tomatoes, red peppers, etc).  Limit sweet tea, regular sodas and alcoholic beverages, all of which has a lot of calories and sugar.  Up to 1 alcoholic drink daily may be beneficial for women (unless trying to lose weight, watch sugars).  Drink a lot of water.  Calcium recommendations are 1200-1500 mg daily (1500 mg for postmenopausal women or women without ovaries), and vitamin D 1000 IU daily.  This should be obtained from diet and/or supplements (vitamins), and calcium should not be taken all at once, but in divided doses.  Monthly self breast exams and yearly mammograms for women over the age of 74 is recommended.  Sunscreen of at least SPF 30 should be used on all sun-exposed parts of the skin when outside between the hours of 10 am and 4 pm (not just when at beach or pool, but even with exercise, golf, tennis, and yard work!)  Use a sunscreen that says "broad spectrum" so it covers both UVA and UVB rays, and make sure to reapply every 1-2 hours.  Remember to change the batteries in your smoke detectors when changing your clock times in the spring and fall.  Use your seat belt every time you are in a car, and please drive safely and not be distracted with cell phones and texting while driving.   [name] , Thank you for taking time to come for your Medicare Wellness Visit. I appreciate your ongoing commitment to your health goals. Please review the  following plan we discussed and let me know if I can assist you in the future.   These are the goals we discussed: Goals    None      This is a list of the screening recommended for you and due dates:  Health Maintenance  Topic Date Due  . Flu Shot  01/12/2017  . Hemoglobin A1C  01/26/2017  . Complete foot exam   03/29/2017  . Eye exam for diabetics  07/20/2017  . Tetanus Vaccine  10/18/2017  . Mammogram  04/22/2018  . Colon Cancer Screening  11/27/2023  . DEXA scan (bone density measurement)  Completed  . Pneumonia vaccines  Completed   We did your foot exam today, as well as your A1c. You got your flu shot already this year (ignore the date above). Schedule your diabetic eye exam for February. Please call to schedule your yearly mammogram--it is due now (not 04/2018 as stated above).  Go to the pharmacy to get your tetanus vaccine (Td or Tdap, due by May). I recommend getting the new shingles vaccine (Shingrix). You will need to check with your insurance to see if it is covered, and if covered by Medicare Part D, you need to get from the pharmacy rather than our office.  It is a series of 2 injections, spaced 2 months apart.  Please fill out your Living Will and healthcare power of attorney.  We would like a copy  of it once it is notarized.  It is very important that you get your weight down.  I highly recommend that you restart Weight [employer] (tracking all of your food, exercise, and weekly weigh-ins, not just buying their foods). We will look into a weight loss clinic to also help you. We are limited in some of the medications by your kidney function and the cost of the medications.  I think you would be a good candidate for weight loss surgery--please let me know if you change your mind about the referral.  We will schedule your for an ultrasound of your abdomen to evaluate the asymmetric swelling that you are noting--to make sure there isn't any mass, hernia or other  abnormality causing this.

## 2017-05-17 LAB — CBC WITH DIFFERENTIAL/PLATELET
BASOS ABS: 59 {cells}/uL (ref 0–200)
Basophils Relative: 0.8 %
EOS PCT: 3.8 %
Eosinophils Absolute: 281 cells/uL (ref 15–500)
HEMATOCRIT: 39 % (ref 35.0–45.0)
Hemoglobin: 12.6 g/dL (ref 11.7–15.5)
LYMPHS ABS: 1680 {cells}/uL (ref 850–3900)
MCH: 27.9 pg (ref 27.0–33.0)
MCHC: 32.3 g/dL (ref 32.0–36.0)
MCV: 86.5 fL (ref 80.0–100.0)
MONOS PCT: 7 %
MPV: 11.6 fL (ref 7.5–12.5)
NEUTROS ABS: 4862 {cells}/uL (ref 1500–7800)
NEUTROS PCT: 65.7 %
PLATELETS: 260 10*3/uL (ref 140–400)
RBC: 4.51 10*6/uL (ref 3.80–5.10)
RDW: 13.9 % (ref 11.0–15.0)
TOTAL LYMPHOCYTE: 22.7 %
WBC mixed population: 518 cells/uL (ref 200–950)
WBC: 7.4 10*3/uL (ref 3.8–10.8)

## 2017-05-17 LAB — COMPREHENSIVE METABOLIC PANEL
AG RATIO: 1.4 (calc) (ref 1.0–2.5)
ALKALINE PHOSPHATASE (APISO): 94 U/L (ref 33–130)
ALT: 13 U/L (ref 6–29)
AST: 15 U/L (ref 10–35)
Albumin: 4.3 g/dL (ref 3.6–5.1)
BUN/Creatinine Ratio: 24 (calc) — ABNORMAL HIGH (ref 6–22)
BUN: 39 mg/dL — ABNORMAL HIGH (ref 7–25)
CHLORIDE: 103 mmol/L (ref 98–110)
CO2: 23 mmol/L (ref 20–32)
Calcium: 9.1 mg/dL (ref 8.6–10.4)
Creat: 1.64 mg/dL — ABNORMAL HIGH (ref 0.60–0.93)
GLOBULIN: 3 g/dL (ref 1.9–3.7)
GLUCOSE: 139 mg/dL — AB (ref 65–99)
Potassium: 4.3 mmol/L (ref 3.5–5.3)
Sodium: 136 mmol/L (ref 135–146)
Total Bilirubin: 0.4 mg/dL (ref 0.2–1.2)
Total Protein: 7.3 g/dL (ref 6.1–8.1)

## 2017-05-17 LAB — TSH: TSH: 3.8 m[IU]/L (ref 0.40–4.50)

## 2017-05-17 LAB — LIPID PANEL
Cholesterol: 192 mg/dL (ref <200)
HDL: 44 mg/dL — AB (ref >50)
LDL CHOLESTEROL (CALC): 122 mg/dL — AB
NON-HDL CHOLESTEROL (CALC): 148 mg/dL — AB (ref <130)
TRIGLYCERIDES: 149 mg/dL (ref <150)
Total CHOL/HDL Ratio: 4.4 (calc) (ref <5.0)

## 2017-05-17 LAB — URIC ACID: Uric Acid, Serum: 6.5 mg/dL (ref 2.5–7.0)

## 2017-05-17 LAB — MICROALBUMIN / CREATININE URINE RATIO
CREATININE, URINE: 208 mg/dL (ref 20–275)
MICROALB UR: 1.8 mg/dL
Microalb Creat Ratio: 9 mcg/mg creat (ref <30)

## 2017-05-17 NOTE — Telephone Encounter (Signed)
LMTCB- pt has appt 05/18/2017

## 2017-05-19 ENCOUNTER — Other Ambulatory Visit: Payer: Self-pay | Admitting: Family Medicine

## 2017-05-19 ENCOUNTER — Other Ambulatory Visit: Payer: Self-pay | Admitting: *Deleted

## 2017-05-19 DIAGNOSIS — N183 Chronic kidney disease, stage 3 unspecified: Secondary | ICD-10-CM

## 2017-05-19 DIAGNOSIS — E1122 Type 2 diabetes mellitus with diabetic chronic kidney disease: Secondary | ICD-10-CM

## 2017-06-02 ENCOUNTER — Telehealth: Payer: Self-pay | Admitting: Family Medicine

## 2017-06-02 DIAGNOSIS — M109 Gout, unspecified: Secondary | ICD-10-CM

## 2017-06-02 MED ORDER — COLCHICINE 0.6 MG PO TABS: ORAL_TABLET | ORAL | 0 refills | Status: DC

## 2017-06-02 NOTE — Telephone Encounter (Signed)
rx sent. /RLB  

## 2017-06-02 NOTE — Telephone Encounter (Signed)
ok 

## 2017-06-02 NOTE — Telephone Encounter (Signed)
Rcvd refill request from [facility] for Colchicine 0.6 mg #30

## 2017-06-20 ENCOUNTER — Ambulatory Visit
Admission: RE | Admit: 2017-06-20 | Discharge: 2017-06-20 | Disposition: A | Discharge status: Home / Self Care | Payer: Medicare HMO | Source: Ambulatory Visit | Attending: Family Medicine | Admitting: Family Medicine

## 2017-06-20 ENCOUNTER — Ambulatory Visit
Admission: RE | Admit: 2017-06-20 | Discharge: 2017-06-20 | Disposition: A | Discharge status: Home / Self Care | Payer: Medicare PPO | Source: Ambulatory Visit | Attending: Family Medicine | Admitting: Family Medicine

## 2017-06-20 DIAGNOSIS — R1900 Intra-abdominal and pelvic swelling, mass and lump, unspecified site: Secondary | ICD-10-CM

## 2017-06-20 DIAGNOSIS — R1903 Right lower quadrant abdominal swelling, mass and lump: Secondary | ICD-10-CM

## 2017-06-20 DIAGNOSIS — R1031 Right lower quadrant pain: Secondary | ICD-10-CM | POA: Diagnosis not present

## 2017-06-29 ENCOUNTER — Other Ambulatory Visit: Payer: Self-pay | Admitting: Family Medicine

## 2017-06-29 DIAGNOSIS — N183 Chronic kidney disease, stage 3 unspecified: Secondary | ICD-10-CM

## 2017-06-29 DIAGNOSIS — E1122 Type 2 diabetes mellitus with diabetic chronic kidney disease: Secondary | ICD-10-CM

## 2017-06-29 DIAGNOSIS — I10 Essential (primary) hypertension: Secondary | ICD-10-CM

## 2017-07-12 ENCOUNTER — Ambulatory Visit: Payer: Medicare PPO | Admitting: Sports Medicine

## 2017-07-12 ENCOUNTER — Encounter: Payer: Self-pay | Admitting: Sports Medicine

## 2017-07-12 VITALS — BP 159/78 | HR 91

## 2017-07-12 DIAGNOSIS — Z6841 Body Mass Index (BMI) 40.0 and over, adult: Secondary | ICD-10-CM

## 2017-07-12 DIAGNOSIS — M79675 Pain in left toe(s): Secondary | ICD-10-CM

## 2017-07-12 DIAGNOSIS — N183 Chronic kidney disease, stage 3 unspecified: Secondary | ICD-10-CM

## 2017-07-12 DIAGNOSIS — M79674 Pain in right toe(s): Secondary | ICD-10-CM | POA: Diagnosis not present

## 2017-07-12 DIAGNOSIS — B351 Tinea unguium: Secondary | ICD-10-CM

## 2017-07-12 DIAGNOSIS — E1122 Type 2 diabetes mellitus with diabetic chronic kidney disease: Secondary | ICD-10-CM

## 2017-07-12 DIAGNOSIS — I739 Peripheral vascular disease, unspecified: Secondary | ICD-10-CM | POA: Diagnosis not present

## 2017-07-12 NOTE — Progress Notes (Signed)
   Subjective:    Patient ID: Kerri Richardson, female    DOB: 07/24/1940, 77 y.o.   MRN: [id number]  HPI    Review of Systems  All other systems reviewed and are negative.      Objective:   Physical Exam        Assessment & Plan:

## 2017-07-12 NOTE — Progress Notes (Signed)
Subjective: Kerri Richardson is a 77 y.o. female patient with history of diabetes who presents to office today complaining of long,mildly painful nails especially at right 1st toe while ambulating in shoes; unable to trim. Patient states that the glucose reading this morning was not checked last a1c was 6.1. Patient denies any new changes in medication or new problems.   Review of Systems  Musculoskeletal:       R toe pain and thick nail  All other systems reviewed and are negative.    Patient Active Problem List   Diagnosis Date Noted  . Irregular heartbeat 08/10/2016  . Acute gout 09/24/2015  . Morbid obesity with body mass index of 45.0-49.9 in adult [facility]) 06/10/2014  . Diabetes mellitus with stage 3 chronic kidney disease ([place]) 03/25/2014  . Hypertensive nephropathy 03/25/2014  . Type 2 diabetes, controlled, with retinopathy ([place]) 12/10/2013  . Peripheral edema 12/22/2012  . Diabetic retinopathy ([place]) 08/23/2012  . Mixed hyperlipidemia 05/03/2011  . Type II or unspecified type diabetes mellitus without mention of complication, not stated as uncontrolled 02/01/2011  . Pure hypercholesterolemia 02/01/2011  . Essential hypertension, benign 02/01/2011  . CKD (chronic kidney disease), stage III ([place]) 02/01/2011   Current Outpatient Medications on File Prior to Visit  Medication Sig Dispense Refill  . ACCU-CHEK FASTCLIX LANCETS MISC TEST TWO TIMES DAILY 204 each 2  . ACCU-CHEK SMARTVIEW test strip TEST TWICE DAILY 200 each 2  . acetaminophen (TYLENOL) 500 MG tablet Take 1,000 mg by mouth every 6 (six) hours as needed for mild pain. Reported on 09/24/2015    . allopurinol (ZYLOPRIM) 100 MG tablet TAKE 1 TABLET EVERY DAY 90 tablet 1  . amLODipine (NORVASC) 10 MG tablet Take 1 tablet (10 mg total) by mouth daily. 90 tablet 1  . aspirin 81 MG tablet Take 81 mg by mouth daily.      Marland Kitchen atorvastatin (LIPITOR) 40 MG tablet TAKE 1 TABLET EVERY DAY 90 tablet 1  . BD INSULIN SYRINGE ULTRAFINE 31G  X 5/16" 0.5 ML MISC USE EVERY DAY 90 each 3  . Blood Glucose Monitoring Suppl (ACCU-CHEK NANO SMARTVIEW) w/Device KIT 1 each by Does not apply route 2 (two) times daily. BID 1 kit 0  . Cholecalciferol (VITAMIN D) 1000 UNITS capsule Take 1,000 Units by mouth daily.      . colchicine 0.6 MG tablet Take 2 tablets by mouth at the onset of gout pain.  May take one additional tablet an hour later, if needed 90 tablet 0  . enalapril (VASOTEC) 10 MG tablet TAKE 1 TABLET EVERY DAY 90 tablet 0  . furosemide (LASIX) 40 MG tablet Take 40 mg by mouth daily.     . insulin glargine (LANTUS) 100 UNIT/ML injection INJECT 27 UNITS EVERY NIGHT AT BEDTIME (DISCARD OPEN VIAL 28 DAYS AFTER FIRST OPEN) 30 mL 1  . latanoprost (XALATAN) 0.005 % ophthalmic solution Place 1 drop into both eyes at bedtime.      . pioglitazone (ACTOS) 30 MG tablet TAKE 1 TABLET EVERY DAY 90 tablet 1  . vitamin B-12 (CYANOCOBALAMIN) 100 MCG tablet Take 100 mcg by mouth daily.       No current facility-administered medications on file prior to visit.    No Known Allergies  Recent Results (from the past 2160 hour(s))  HgB A1c     Status: None   Collection Time: 05/16/17  9:47 AM  Result Value Ref Range   Hemoglobin A1C 6.4   Lipid panel  Status: Abnormal   Collection Time: 05/16/17 10:40 AM  Result Value Ref Range   Cholesterol 192 <200 mg/dL   HDL 44 (L) >50 mg/dL   Triglycerides 149 <150 mg/dL   LDL Cholesterol (Calc) 122 (H) mg/dL (calc)    Comment: Reference range: <100 . Desirable range <100 mg/dL for primary prevention;   <70 mg/dL for patients with CHD or diabetic patients  with > or = 2 CHD risk factors. Marland Kitchen LDL-C is now calculated using the Martin-Hopkins  calculation, which is a validated novel method providing  better accuracy than the Friedewald equation in the  estimation of LDL-C.  Cresenciano Genre et al. Annamaria Helling. 5093;267(12): 2061-2068  (http://education.[employer].com/faq/FAQ164)    Total CHOL/HDL Ratio 4.4  <5.0 (calc)   Non-HDL Cholesterol (Calc) 148 (H) <130 mg/dL (calc)    Comment: For patients with diabetes plus 1 major ASCVD risk  factor, treating to a non-HDL-C goal of <100 mg/dL  (LDL-C of <70 mg/dL) is considered a therapeutic  option.   Comprehensive metabolic panel     Status: Abnormal   Collection Time: 05/16/17 10:40 AM  Result Value Ref Range   Glucose, Bld 139 (H) 65 - 99 mg/dL    Comment: .            Fasting reference interval . For someone without known diabetes, a glucose value >125 mg/dL indicates that they may have diabetes and this should be confirmed with a follow-up test. .    BUN 39 (H) 7 - 25 mg/dL   Creat 1.64 (H) 0.60 - 0.93 mg/dL    Comment: For patients >2 years of age, the reference limit for Creatinine is approximately 13% higher for people identified as African-American. .    BUN/Creatinine Ratio 24 (H) 6 - 22 (calc)   Sodium 136 135 - 146 mmol/L   Potassium 4.3 3.5 - 5.3 mmol/L   Chloride 103 98 - 110 mmol/L   CO2 23 20 - 32 mmol/L   Calcium 9.1 8.6 - 10.4 mg/dL   Total Protein 7.3 6.1 - 8.1 g/dL   Albumin 4.3 3.6 - 5.1 g/dL   Globulin 3.0 1.9 - 3.7 g/dL (calc)   AG Ratio 1.4 1.0 - 2.5 (calc)   Total Bilirubin 0.4 0.2 - 1.2 mg/dL   Alkaline phosphatase (APISO) 94 33 - 130 U/L   AST 15 10 - 35 U/L   ALT 13 6 - 29 U/L  CBC with Differential/Platelet     Status: None   Collection Time: 05/16/17 10:40 AM  Result Value Ref Range   WBC 7.4 3.8 - 10.8 Thousand/uL   RBC 4.51 3.80 - 5.10 Million/uL   Hemoglobin 12.6 11.7 - 15.5 g/dL   HCT 39.0 35.0 - 45.0 %   MCV 86.5 80.0 - 100.0 fL   MCH 27.9 27.0 - 33.0 pg   MCHC 32.3 32.0 - 36.0 g/dL   RDW 13.9 11.0 - 15.0 %   Platelets 260 140 - 400 Thousand/uL   MPV 11.6 7.5 - 12.5 fL   Neutro Abs 4,862 1,500 - 7,800 cells/uL   Lymphs Abs 1,680 850 - 3,900 cells/uL   WBC mixed population 518 200 - 950 cells/uL   Eosinophils Absolute 281 15 - 500 cells/uL   Basophils Absolute 59 0 - 200 cells/uL    Neutrophils Relative % 65.7 %   Total Lymphocyte 22.7 %   Monocytes Relative 7.0 %   Eosinophils Relative 3.8 %   Basophils Relative 0.8 %  TSH  Status: None   Collection Time: 05/16/17 10:40 AM  Result Value Ref Range   TSH 3.80 0.40 - 4.50 mIU/L  Microalbumin / creatinine urine ratio     Status: None   Collection Time: 05/16/17 10:40 AM  Result Value Ref Range   Creatinine, Urine 208 20 - 275 mg/dL   Microalb, Ur 1.8 mg/dL    Comment: Reference Range Not established    Microalb Creat Ratio 9 <30 mcg/mg creat    Comment: . The ADA defines abnormalities in albumin excretion as follows: Marland Kitchen Category         Result (mcg/mg creatinine) . Normal                    <30 Microalbuminuria         30-299  Clinical albuminuria   > OR = 300 . The ADA recommends that at least two of three specimens collected within a 3-6 month period be abnormal before considering a patient to be within a diagnostic category.   Uric acid     Status: None   Collection Time: 05/16/17 10:40 AM  Result Value Ref Range   Uric Acid, Serum 6.5 2.5 - 7.0 mg/dL    Comment: Therapeutic target for gout patients: <6.0 mg/dL .     Objective: General: Patient is awake, alert, and oriented x 3 and in no acute distress.  Integument: Skin is warm, dry and supple bilateral. Nails are tender, long, thickened and  dystrophic with subungual debris, consistent with onychomycosis, 1-5 bilateral with the most thickness on right 1st toenail and pince deformity. No signs of infection. No open lesions or preulcerative lesions present bilateral. Remaining integument unremarkable.  Vasculature:  Dorsalis Pedis pulse 1/4 bilateral. Posterior Tibial pulse  0/4 bilateral due to 1+ pitting edema to ankles Capillary fill time <3 sec 1-5 bilateral. Scant hair growth to the level of the digits. Temperature gradient within normal limits. Mild brawny skin changes and varicosities present bilateral.  Neurology: The patient has  intact sensation measured with a 5.07/10g Semmes Weinstein Monofilament at all pedal sites bilateral. Vibratory sensation diminished bilateral with tuning fork. No Babinski sign present bilateral.   Musculoskeletal: Aspymptomatic pes planus pedal deformities noted bilateral. Muscular strength 5/5 in all lower extremity muscular groups bilateral without pain on range of motion. No tenderness with calf compression bilateral.  Assessment and Plan: Problem List Items Addressed This Visit      Endocrine   Diabetes mellitus with stage 3 chronic kidney disease ([place])     Other   Morbid obesity with body mass index of 45.0-49.9 in adult [facility])    Other Visit Diagnoses    Pain due to onychomycosis of toenails of both feet    -  Primary   PVD (peripheral vascular disease) ([place])          -Examined patient. -Discussed and educated patient on diabetic foot care, especially with  regards to the vascular, neurological and musculoskeletal systems.  -Stressed the importance of good glycemic control and the detriment of not  controlling glucose levels in relation to the foot. -Mechanically debrided all nails 1-5 bilateral using sterile nail nipper and filed with dremel without incident  -Recommend elevation when sitting to assist with edema control -Answered all patient questions -Patient to return  in 3 months for at risk foot care -Patient advised to call the office if any problems or questions arise in the meantime.  Landis Martins, DPM

## 2017-07-15 ENCOUNTER — Encounter: Payer: Self-pay | Admitting: Obstetrics and Gynecology

## 2017-07-15 ENCOUNTER — Ambulatory Visit: Payer: Medicare PPO | Admitting: Obstetrics and Gynecology

## 2017-07-15 ENCOUNTER — Other Ambulatory Visit (HOSPITAL_COMMUNITY)
Admission: RE | Admit: 2017-07-15 | Discharge: 2017-07-15 | Disposition: A | Discharge status: Home / Self Care | Payer: Medicare PPO | Source: Ambulatory Visit | Attending: Obstetrics and Gynecology | Admitting: Obstetrics and Gynecology

## 2017-07-15 ENCOUNTER — Other Ambulatory Visit: Payer: Self-pay

## 2017-07-15 VITALS — BP 142/70 | HR 88 | Resp 16 | Ht 62.0 in | Wt 288.0 lb

## 2017-07-15 DIAGNOSIS — Z01419 Encounter for gynecological examination (general) (routine) without abnormal findings: Secondary | ICD-10-CM

## 2017-07-15 NOTE — Progress Notes (Signed)
77 y.o. G0P0000 Single African American female here for annual exam.    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   PCP: Dr. Tomi Bamberger    Patient's last menstrual period was 06/14/1998 (approximate).           Sexually active: No.  The current method of family planning is post menopausal status.    Exercising: No.  The patient does not participate in regular exercise at present. Smoker:  no  Health Maintenance: Pap:  07/14/16 Pap and HR HPV negative;  02-24-15 Neg:Pos HR HPV and Pos 18/45; colposcopy 04-25-15 unsatisfactory.  LEEP 05-27-15 revealed LGSIL of cervix, margins negative and ECC negative History of abnormal Pap:  yes MMG:  04/22/16 BIRADS 1 negative/density b.  Will do next week.  Colonoscopy:  11-26-13 polyp with Dr.Mann;next due 11/2018 BMD:   02/10/10  Result  Normal: TBC TDaP:  10/19/2007 Gardasil:  N/A Hep C: not indicated due to age Screening Labs:  PCP   reports that she quit smoking about 17 years ago. she has never used smokeless tobacco. She reports that she does not drink alcohol or use drugs.  Past Medical History:  Diagnosis Date  . Arthritis   . CKD (chronic kidney disease) stage 3, GFR 30-59 ml/min (HCC)    Dr. [name]  . Diabetic retinopathy   . DM retinopathy ([place]) 08/17/12   early  . Essential hypertension, benign   . Glaucoma   . Gout   . Pure hypercholesterolemia   . Type II or unspecified type diabetes mellitus without mention of complication, not stated as uncontrolled     Past Surgical History:  Procedure Laterality Date  . BRAIN SURGERY  1969   Burr holes in skull to alleviate pressure on the brain (no shunt)  . CATARACT EXTRACTION, BILATERAL  2012   Dr. [name]  . COLONOSCOPY  3/05, 11/2013   due again 2020  . COLPOSCOPY N/A 05/27/2015   Procedure: COLPOSCOPY;  Surgeon: [name], MD;  Location: [facility];  Service: Gynecology;  Laterality: N/A;  . LEEP N/A 05/27/2015   Procedure: LOOP ELECTROSURGICAL EXCISION PROCEDURE (LEEP);  Surgeon: [name], MD;  Location: [facility];  Service: Gynecology;  Laterality: N/A;  . TONSILLECTOMY  age 66    Current Outpatient Medications  Medication Sig Dispense Refill  . ACCU-CHEK FASTCLIX LANCETS MISC TEST TWO TIMES DAILY 204 each 2  . ACCU-CHEK SMARTVIEW test strip TEST TWICE DAILY 200 each 2  . acetaminophen (TYLENOL) 500 MG tablet Take 1,000 mg by mouth every 6 (six) hours as needed for mild pain. Reported on 09/24/2015    . allopurinol (ZYLOPRIM) 100 MG tablet TAKE 1 TABLET EVERY DAY 90 tablet 1  . amLODipine (NORVASC) 10 MG tablet Take 1 tablet (10 mg total) by mouth daily. 90 tablet 1  . aspirin 81 MG tablet Take 81 mg by mouth daily.      Marland Kitchen atorvastatin (LIPITOR) 40 MG tablet TAKE 1 TABLET EVERY DAY 90 tablet 1  . BD INSULIN SYRINGE ULTRAFINE 31G X 5/16" 0.5 ML MISC USE EVERY DAY 90 each 3  . Blood Glucose Monitoring Suppl (ACCU-CHEK NANO SMARTVIEW) w/Device KIT 1 each by Does not apply route 2 (two) times daily. BID 1 kit 0  . Cholecalciferol (VITAMIN D) 1000 UNITS capsule Take 1,000 Units by mouth daily.      . colchicine 0.6 MG tablet Take 2 tablets by mouth at the onset of gout pain.  May take one additional tablet  an hour later, if needed 90 tablet 0  . enalapril (VASOTEC) 10 MG tablet TAKE 1 TABLET EVERY DAY 90 tablet 0  . furosemide (LASIX) 40 MG tablet Take 40 mg by mouth daily.     . insulin glargine (LANTUS) 100 UNIT/ML injection INJECT 27 UNITS EVERY NIGHT AT BEDTIME (DISCARD OPEN VIAL 28 DAYS AFTER FIRST OPEN) 30 mL 1  . latanoprost (XALATAN) 0.005 % ophthalmic solution Place 1 drop into both eyes at bedtime.      . pioglitazone (ACTOS) 30 MG tablet TAKE 1 TABLET EVERY DAY 90 tablet 1  . vitamin B-12 (CYANOCOBALAMIN) 100 MCG tablet Take 100 mcg by mouth daily.       No current facility-administered medications for this visit.     Family History  Problem Relation Age of Onset  . Hypertension Mother   . Cancer Mother        lung (nonsmoker)  . Stroke Father   .  Hypertension Father   . Diabetes Father   . Hypertension Sister   . Diabetes Sister   . Heart disease Sister   . Hypertension Sister   . Breast cancer Maternal Aunt 50       51 of old age  . Diabetes Paternal Grandmother     ROS:  Pertinent items are noted in HPI.  Otherwise, a comprehensive ROS was negative.  Exam:   BP (!) 142/70 (BP Location: Right Arm, Patient Position: Sitting, Cuff Size: Large)   Pulse 88   Resp 16   Ht '5\' 2"'  (1.575 m)   Wt 288 lb (130.6 kg)   LMP 06/14/1998 (Approximate)   BMI 52.68 kg/m     General appearance: alert, cooperative and appears stated age Head: Normocephalic, without obvious abnormality, atraumatic Neck: no adenopathy, supple, symmetrical, trachea midline and thyroid normal to inspection and palpation Lungs: clear to auscultation bilaterally Breasts: normal appearance, no masses or tenderness, No nipple retraction or dimpling, No nipple discharge or bleeding, No axillary or supraclavicular adenopathy Heart: regular rate and rhythm Abdomen: soft, non-tender; no masses, no organomegaly Extremities: extremities normal, atraumatic, no cyanosis or edema Skin: Skin color, texture, turgor normal. No rashes or lesions Lymph nodes: Cervical, supraclavicular, and axillary nodes normal. No abnormal inguinal nodes palpated Neurologic: Grossly normal  Pelvic: External genitalia:  no lesions              Urethra:  normal appearing urethra with no masses, tenderness or lesions              Bartholins and Skenes: normal                 Vagina: normal appearing vagina with normal color and discharge, no lesions              Cervix: no lesions              Pap taken: Yes.   Bimanual Exam:  Uterus:  normal size, contour, position, consistency, mobility, non-tender              Adnexa: no mass, fullness, tenderness              Rectal exam: Yes.  .  Confirms.              Anus:  normal sphincter tone, no lesions  Chaperone was present for  exam.  Assessment:   Well woman visit with normal exam. Hx LGSIL.  Status post LEEP.  Plan: Mammogram screening discussed.  I discussed 3D.  Recommended self breast awareness. Pap and HR HPV as above. Guidelines for Calcium, Vitamin D, regular exercise program including cardiovascular and weight bearing exercise.   Follow up annually and prn.    After visit summary provided.

## 2017-07-15 NOTE — Progress Notes (Deleted)
GYNECOLOGY  VISIT   HPI: 77 y.o.   Single  African American  female   [place] with Patient's last menstrual period was 06/14/1998 (approximate).   here for     GYNECOLOGIC HISTORY: Patient's last menstrual period was 06/14/1998 (approximate). Contraception:  Postmenopausal Menopausal hormone therapy:  none Last mammogram:  04/22/16 BIRADS 1 negative/density b Last pap smear: 07/14/16 Pap and HR HPV negative;  02-24-15 Neg:Pos HR HPV and Pos 18/45; colposcopy 04-25-15 unsatisfactory.  LEEP 05-27-15 revealed LGSIL of cervix, margins negative and ECC negative        OB History    Gravida Para Term Preterm AB Living   0 0 0 0 0 0   SAB TAB Ectopic Multiple Live Births   0 0 0 0           Patient Active Problem List   Diagnosis Date Noted  . Irregular heartbeat 08/10/2016  . Acute gout 09/24/2015  . Morbid obesity with body mass index of 45.0-49.9 in adult [facility]) 06/10/2014  . Diabetes mellitus with stage 3 chronic kidney disease ([place]) 03/25/2014  . Hypertensive nephropathy 03/25/2014  . Type 2 diabetes, controlled, with retinopathy ([place]) 12/10/2013  . Peripheral edema 12/22/2012  . Diabetic retinopathy ([place]) 08/23/2012  . Mixed hyperlipidemia 05/03/2011  . Type II or unspecified type diabetes mellitus without mention of complication, not stated as uncontrolled 02/01/2011  . Pure hypercholesterolemia 02/01/2011  . Essential hypertension, benign 02/01/2011  . CKD (chronic kidney disease), stage III ([place]) 02/01/2011    Past Medical History:  Diagnosis Date  . Arthritis   . CKD (chronic kidney disease) stage 3, GFR 30-59 ml/min (HCC)    Dr. [name]  . Diabetic retinopathy   . DM retinopathy ([place]) 08/17/12   early  . Essential hypertension, benign   . Glaucoma   . Gout   . Pure hypercholesterolemia   . Type II or unspecified type diabetes mellitus without mention of complication, not stated as uncontrolled     Past Surgical History:  Procedure Laterality Date  . BRAIN SURGERY   1969   Burr holes in skull to alleviate pressure on the brain (no shunt)  . CATARACT EXTRACTION, BILATERAL  2012   Dr. [name]  . COLONOSCOPY  3/05, 11/2013   due again 2020  . COLPOSCOPY N/A 05/27/2015   Procedure: COLPOSCOPY;  Surgeon: [name], MD;  Location: [facility];  Service: Gynecology;  Laterality: N/A;  . LEEP N/A 05/27/2015   Procedure: LOOP ELECTROSURGICAL EXCISION PROCEDURE (LEEP);  Surgeon: [name], MD;  Location: [facility];  Service: Gynecology;  Laterality: N/A;  . TONSILLECTOMY  age 70    Current Outpatient Medications  Medication Sig Dispense Refill  . ACCU-CHEK FASTCLIX LANCETS MISC TEST TWO TIMES DAILY 204 each 2  . ACCU-CHEK SMARTVIEW test strip TEST TWICE DAILY 200 each 2  . acetaminophen (TYLENOL) 500 MG tablet Take 1,000 mg by mouth every 6 (six) hours as needed for mild pain. Reported on 09/24/2015    . allopurinol (ZYLOPRIM) 100 MG tablet TAKE 1 TABLET EVERY DAY 90 tablet 1  . amLODipine (NORVASC) 10 MG tablet Take 1 tablet (10 mg total) by mouth daily. 90 tablet 1  . aspirin 81 MG tablet Take 81 mg by mouth daily.      Marland Kitchen atorvastatin (LIPITOR) 40 MG tablet TAKE 1 TABLET EVERY DAY 90 tablet 1  . BD INSULIN SYRINGE ULTRAFINE 31G X 5/16" 0.5 ML MISC USE EVERY DAY 90 each 3  .  Blood Glucose Monitoring Suppl (ACCU-CHEK NANO SMARTVIEW) w/Device KIT 1 each by Does not apply route 2 (two) times daily. BID 1 kit 0  . Cholecalciferol (VITAMIN D) 1000 UNITS capsule Take 1,000 Units by mouth daily.      . colchicine 0.6 MG tablet Take 2 tablets by mouth at the onset of gout pain.  May take one additional tablet an hour later, if needed 90 tablet 0  . enalapril (VASOTEC) 10 MG tablet TAKE 1 TABLET EVERY DAY 90 tablet 0  . furosemide (LASIX) 40 MG tablet Take 40 mg by mouth daily.     . insulin glargine (LANTUS) 100 UNIT/ML injection INJECT 27 UNITS EVERY NIGHT AT BEDTIME (DISCARD OPEN VIAL 28 DAYS AFTER FIRST OPEN) 30 mL 1  . latanoprost (XALATAN)  0.005 % ophthalmic solution Place 1 drop into both eyes at bedtime.      . pioglitazone (ACTOS) 30 MG tablet TAKE 1 TABLET EVERY DAY 90 tablet 1  . vitamin B-12 (CYANOCOBALAMIN) 100 MCG tablet Take 100 mcg by mouth daily.       No current facility-administered medications for this visit.      ALLERGIES: Patient has no known allergies.  Family History  Problem Relation Age of Onset  . Hypertension Mother   . Cancer Mother        lung (nonsmoker)  . Stroke Father   . Hypertension Father   . Diabetes Father   . Hypertension Sister   . Diabetes Sister   . Heart disease Sister   . Hypertension Sister   . Breast cancer Maternal Aunt 61       74 of old age  . Diabetes Paternal Grandmother     Social History   Socioeconomic History  . Marital status: Single    Spouse name: Not on file  . Number of children: Not on file  . Years of education: Not on file  . Highest education level: Not on file  Social Needs  . Financial resource strain: Not on file  . Food insecurity - worry: Not on file  . Food insecurity - inability: Not on file  . Transportation needs - medical: Not on file  . Transportation needs - non-medical: Not on file  Occupational History  . Not on file  Tobacco Use  . Smoking status: Former Smoker    Last attempt to quit: 06/14/2000    Years since quitting: 17.0  . Smokeless tobacco: Never Used  Substance and Sexual Activity  . Alcohol use: No    Alcohol/week: 0.0 oz  . Drug use: No  . Sexual activity: No    Partners: Male    Birth control/protection: Post-menopausal  Other Topics Concern  . Not on file  Social History Narrative   Worked at [employer], retired 12/2013. Lives alone.  She has a sister in [place].  No children. Her God-son lives in [place].   Babysitting during the day    ROS:  Pertinent items are noted in HPI.  PHYSICAL EXAMINATION:    LMP 06/14/1998 (Approximate)     General appearance: alert, cooperative and appears stated age Head:  Normocephalic, without obvious abnormality, atraumatic Neck: no adenopathy, supple, symmetrical, trachea midline and thyroid normal to inspection and palpation Lungs: clear to auscultation bilaterally Breasts: normal appearance, no masses or tenderness, No nipple retraction or dimpling, No nipple discharge or bleeding, No axillary or supraclavicular adenopathy Heart: regular rate and rhythm Abdomen: soft, non-tender, no masses,  no organomegaly Extremities: extremities normal, atraumatic, no  cyanosis or edema Skin: Skin color, texture, turgor normal. No rashes or lesions Lymph nodes: Cervical, supraclavicular, and axillary nodes normal. No abnormal inguinal nodes palpated Neurologic: Grossly normal  Pelvic: External genitalia:  no lesions              Urethra:  normal appearing urethra with no masses, tenderness or lesions              Bartholins and Skenes: normal                 Vagina: normal appearing vagina with normal color and discharge, no lesions              Cervix: no lesions                Bimanual Exam:  Uterus:  normal size, contour, position, consistency, mobility, non-tender              Adnexa: no mass, fullness, tenderness              Rectal exam: {yes no:[id number]}.  Confirms.              Anus:  normal sphincter tone, no lesions  Chaperone was present for exam.  ASSESSMENT     PLAN     An After Visit Summary was printed and given to the patient.  ______ minutes face to face time of which over 50% was spent in counseling.

## 2017-07-15 NOTE — Patient Instructions (Signed)

## 2017-07-18 ENCOUNTER — Other Ambulatory Visit: Payer: Self-pay | Admitting: Family Medicine

## 2017-07-18 DIAGNOSIS — Z1231 Encounter for screening mammogram for malignant neoplasm of breast: Secondary | ICD-10-CM

## 2017-07-19 LAB — CYTOLOGY - PAP
Diagnosis: NEGATIVE
HPV: NOT DETECTED

## 2017-07-20 ENCOUNTER — Telehealth: Payer: Self-pay | Admitting: *Deleted

## 2017-07-20 NOTE — Telephone Encounter (Signed)
Spoke with patient, advised as seen below per Dr. Silva. Patient verbalizes understanding and is agreeable. Will close encounter.  

## 2017-07-20 NOTE — Telephone Encounter (Signed)
-----   Message from Patton SallesBrook E Amundson C Silva, MD sent at 07/20/2017  4:56 AM EST ----- Please inform patient of normal pap and negative HR HPV.  She may return to routine screening.  Recall - 02.

## 2017-07-20 NOTE — Telephone Encounter (Signed)
Notes recorded by Leda MinHamm, Americus Scheurich N, RN on 07/20/2017 at 11:18 AM EST Left message to call Noreene LarssonJill at [phone].

## 2017-08-03 DIAGNOSIS — H401131 Primary open-angle glaucoma, bilateral, mild stage: Secondary | ICD-10-CM | POA: Diagnosis not present

## 2017-08-03 DIAGNOSIS — Z961 Presence of intraocular lens: Secondary | ICD-10-CM | POA: Diagnosis not present

## 2017-08-03 DIAGNOSIS — E119 Type 2 diabetes mellitus without complications: Secondary | ICD-10-CM | POA: Diagnosis not present

## 2017-08-03 DIAGNOSIS — H04123 Dry eye syndrome of bilateral lacrimal glands: Secondary | ICD-10-CM | POA: Diagnosis not present

## 2017-08-03 DIAGNOSIS — H3562 Retinal hemorrhage, left eye: Secondary | ICD-10-CM | POA: Diagnosis not present

## 2017-08-03 DIAGNOSIS — H353131 Nonexudative age-related macular degeneration, bilateral, early dry stage: Secondary | ICD-10-CM | POA: Diagnosis not present

## 2017-08-03 LAB — HM DIABETES EYE EXAM

## 2017-08-05 ENCOUNTER — Ambulatory Visit
Admission: RE | Admit: 2017-08-05 | Discharge: 2017-08-05 | Disposition: A | Discharge status: Home / Self Care | Payer: Medicare PPO | Source: Ambulatory Visit | Attending: Family Medicine | Admitting: Family Medicine

## 2017-08-05 DIAGNOSIS — Z1231 Encounter for screening mammogram for malignant neoplasm of breast: Secondary | ICD-10-CM | POA: Diagnosis not present

## 2017-09-05 ENCOUNTER — Other Ambulatory Visit: Payer: Self-pay | Admitting: Family Medicine

## 2017-09-05 DIAGNOSIS — E1122 Type 2 diabetes mellitus with diabetic chronic kidney disease: Secondary | ICD-10-CM

## 2017-09-05 DIAGNOSIS — N183 Chronic kidney disease, stage 3 (moderate): Principal | ICD-10-CM

## 2017-09-05 DIAGNOSIS — I10 Essential (primary) hypertension: Secondary | ICD-10-CM

## 2017-09-06 NOTE — Telephone Encounter (Signed)
Pt has an appt in June. Will refill until then

## 2017-09-20 ENCOUNTER — Encounter: Payer: Self-pay | Admitting: Sports Medicine

## 2017-09-20 ENCOUNTER — Ambulatory Visit: Payer: Medicare PPO | Admitting: Sports Medicine

## 2017-09-20 DIAGNOSIS — Z6841 Body Mass Index (BMI) 40.0 and over, adult: Secondary | ICD-10-CM

## 2017-09-20 DIAGNOSIS — M79674 Pain in right toe(s): Secondary | ICD-10-CM

## 2017-09-20 DIAGNOSIS — M79675 Pain in left toe(s): Secondary | ICD-10-CM | POA: Diagnosis not present

## 2017-09-20 DIAGNOSIS — I739 Peripheral vascular disease, unspecified: Secondary | ICD-10-CM

## 2017-09-20 DIAGNOSIS — B351 Tinea unguium: Secondary | ICD-10-CM

## 2017-09-20 DIAGNOSIS — N183 Chronic kidney disease, stage 3 unspecified: Secondary | ICD-10-CM

## 2017-09-20 DIAGNOSIS — E1122 Type 2 diabetes mellitus with diabetic chronic kidney disease: Secondary | ICD-10-CM

## 2017-09-20 NOTE — Progress Notes (Signed)
Subjective: Kerri Richardson is a 77 y.o. female patient with history of diabetes who returns to office today complaining of long,mildly painful nails while ambulating in shoes; unable to trim. Patient states that the glucose reading this morning was not checked last a1c was 6.2. Patient denies any new changes in medication or new problems since last visit.    Patient Active Problem List   Diagnosis Date Noted  . Irregular heartbeat 08/10/2016  . Acute gout 09/24/2015  . Morbid obesity with body mass index of 45.0-49.9 in adult [facility]) 06/10/2014  . Diabetes mellitus with stage 3 chronic kidney disease ([place]) 03/25/2014  . Hypertensive nephropathy 03/25/2014  . Type 2 diabetes, controlled, with retinopathy ([place]) 12/10/2013  . Peripheral edema 12/22/2012  . Diabetic retinopathy ([place]) 08/23/2012  . Mixed hyperlipidemia 05/03/2011  . Type II or unspecified type diabetes mellitus without mention of complication, not stated as uncontrolled 02/01/2011  . Pure hypercholesterolemia 02/01/2011  . Essential hypertension, benign 02/01/2011  . CKD (chronic kidney disease), stage III ([place]) 02/01/2011   Current Outpatient Medications on File Prior to Visit  Medication Sig Dispense Refill  . ACCU-CHEK FASTCLIX LANCETS MISC TEST TWO TIMES DAILY 204 each 2  . ACCU-CHEK SMARTVIEW test strip TEST TWICE DAILY 200 each 2  . acetaminophen (TYLENOL) 500 MG tablet Take 1,000 mg by mouth every 6 (six) hours as needed for mild pain. Reported on 09/24/2015    . allopurinol (ZYLOPRIM) 100 MG tablet TAKE 1 TABLET EVERY DAY 90 tablet 1  . amLODipine (NORVASC) 10 MG tablet Take 1 tablet (10 mg total) by mouth daily. 90 tablet 1  . aspirin 81 MG tablet Take 81 mg by mouth daily.      Marland Kitchen atorvastatin (LIPITOR) 40 MG tablet TAKE 1 TABLET EVERY DAY 90 tablet 1  . BD INSULIN SYRINGE ULTRAFINE 31G X 5/16" 0.5 ML MISC USE EVERY DAY 90 each 3  . Blood Glucose Monitoring Suppl (ACCU-CHEK NANO SMARTVIEW) w/Device KIT 1 each by Does  not apply route 2 (two) times daily. BID 1 kit 0  . Cholecalciferol (VITAMIN D) 1000 UNITS capsule Take 1,000 Units by mouth daily.      . colchicine 0.6 MG tablet Take 2 tablets by mouth at the onset of gout pain.  May take one additional tablet an hour later, if needed 90 tablet 0  . enalapril (VASOTEC) 10 MG tablet TAKE 1 TABLET EVERY DAY 90 tablet 0  . furosemide (LASIX) 40 MG tablet Take 40 mg by mouth daily.     . insulin glargine (LANTUS) 100 UNIT/ML injection INJECT 27 UNITS EVERY NIGHT AT BEDTIME (DISCARD OPEN VIAL 28 DAYS AFTER FIRST OPEN) 30 mL 1  . latanoprost (XALATAN) 0.005 % ophthalmic solution Place 1 drop into both eyes at bedtime.      . pioglitazone (ACTOS) 30 MG tablet TAKE 1 TABLET EVERY DAY 90 tablet 1  . vitamin B-12 (CYANOCOBALAMIN) 100 MCG tablet Take 100 mcg by mouth daily.       No current facility-administered medications on file prior to visit.    No Known Allergies  Recent Results (from the past 2160 hour(s))  Cytology - PAP     Status: None   Collection Time: 07/15/17 12:00 AM  Result Value Ref Range   Adequacy      Satisfactory for evaluation  endocervical/transformation zone component PRESENT.   Diagnosis      NEGATIVE FOR INTRAEPITHELIAL LESIONS OR MALIGNANCY.   HPV NOT DETECTED     Comment: Normal  Reference Range - NOT Detected   Material Submitted CervicoVaginal Pap [ThinPrep Imaged]    CYTOLOGY - PAP PAP RESULT     Objective: General: Patient is awake, alert, and oriented x 3 and in no acute distress.  Integument: Skin is warm, dry and supple bilateral. Nails are tender, long, thickened and  dystrophic with subungual debris, consistent with onychomycosis, 1-5 bilateral with the most thickness on right 1st toenail and pince deformity. No signs of infection. No open lesions or preulcerative lesions present bilateral. Remaining integument unremarkable.  Vasculature:  Dorsalis Pedis pulse 1/4 bilateral. Posterior Tibial pulse  0/4 bilateral due to 1+  pitting edema to ankles Capillary fill time <3 sec 1-5 bilateral. Scant hair growth to the level of the digits. Temperature gradient within normal limits. Mild brawny skin changes and varicosities present bilateral.  Neurology: The patient has intact sensation measured with a 5.07/10g Semmes Weinstein Monofilament at all pedal sites bilateral. Vibratory sensation diminished bilateral with tuning fork. No Babinski sign present bilateral.   Musculoskeletal: Aspymptomatic pes planus pedal deformities noted bilateral. Muscular strength 5/5 in all lower extremity muscular groups bilateral without pain on range of motion. No tenderness with calf compression bilateral.  Assessment and Plan: Problem List Items Addressed This Visit      Endocrine   Diabetes mellitus with stage 3 chronic kidney disease ([place])     Other   Morbid obesity with body mass index of 45.0-49.9 in adult [facility])    Other Visit Diagnoses    Pain due to onychomycosis of toenails of both feet    -  Primary   PVD (peripheral vascular disease) ([place])          -Examined patient. -Discussed and educated patient on diabetic foot care, especially with  regards to the vascular, neurological and musculoskeletal systems.  -Stressed the importance of good glycemic control and the detriment of not  controlling glucose levels in relation to the foot. -Mechanically debrided all nails 1-5 bilateral using sterile nail nipper and filed with dremel without incident  -Recommend elevation when sitting to assist with edema control as previous discussed  -Answered all patient questions -Patient to return  in 3 months for at risk foot care -Patient advised to call the office if any problems or questions arise in the meantime.  Landis Martins, DPM

## 2017-09-29 ENCOUNTER — Encounter (INDEPENDENT_AMBULATORY_CARE_PROVIDER_SITE_OTHER): Payer: Medicare PPO

## 2017-10-03 ENCOUNTER — Ambulatory Visit (INDEPENDENT_AMBULATORY_CARE_PROVIDER_SITE_OTHER): Payer: Medicare PPO | Admitting: Family Medicine

## 2017-10-03 ENCOUNTER — Encounter (INDEPENDENT_AMBULATORY_CARE_PROVIDER_SITE_OTHER): Payer: Self-pay | Admitting: Family Medicine

## 2017-10-03 VITALS — BP 138/75 | HR 64 | Temp 98.0°F | Ht 62.0 in | Wt 286.0 lb

## 2017-10-03 DIAGNOSIS — E559 Vitamin D deficiency, unspecified: Secondary | ICD-10-CM | POA: Diagnosis not present

## 2017-10-03 DIAGNOSIS — E118 Type 2 diabetes mellitus with unspecified complications: Secondary | ICD-10-CM | POA: Diagnosis not present

## 2017-10-03 DIAGNOSIS — Z6841 Body Mass Index (BMI) 40.0 and over, adult: Secondary | ICD-10-CM

## 2017-10-03 DIAGNOSIS — Z794 Long term (current) use of insulin: Secondary | ICD-10-CM | POA: Diagnosis not present

## 2017-10-03 DIAGNOSIS — R0609 Other forms of dyspnea: Secondary | ICD-10-CM | POA: Diagnosis not present

## 2017-10-03 DIAGNOSIS — Z0289 Encounter for other administrative examinations: Secondary | ICD-10-CM

## 2017-10-03 DIAGNOSIS — Z1331 Encounter for screening for depression: Secondary | ICD-10-CM

## 2017-10-03 DIAGNOSIS — R5383 Other fatigue: Secondary | ICD-10-CM | POA: Diagnosis not present

## 2017-10-04 LAB — HEMOGLOBIN A1C
Est. average glucose Bld gHb Est-mCnc: 154 mg/dL
HEMOGLOBIN A1C: 7 % — AB (ref 4.8–5.6)

## 2017-10-04 LAB — COMPREHENSIVE METABOLIC PANEL
A/G RATIO: 1.5 (ref 1.2–2.2)
ALK PHOS: 91 IU/L (ref 39–117)
ALT: 12 IU/L (ref 0–32)
AST: 13 IU/L (ref 0–40)
Albumin: 4.3 g/dL (ref 3.5–4.8)
BILIRUBIN TOTAL: 0.3 mg/dL (ref 0.0–1.2)
BUN / CREAT RATIO: 24 (ref 12–28)
BUN: 37 mg/dL — ABNORMAL HIGH (ref 8–27)
CHLORIDE: 104 mmol/L (ref 96–106)
CO2: 20 mmol/L (ref 20–29)
Calcium: 9.2 mg/dL (ref 8.7–10.3)
Creatinine, Ser: 1.54 mg/dL — ABNORMAL HIGH (ref 0.57–1.00)
GFR calc Af Amer: 38 mL/min/{1.73_m2} — ABNORMAL LOW (ref >59)
GFR calc non Af Amer: 33 mL/min/{1.73_m2} — ABNORMAL LOW (ref >59)
GLOBULIN, TOTAL: 2.8 g/dL (ref 1.5–4.5)
Glucose: 107 mg/dL — ABNORMAL HIGH (ref 65–99)
POTASSIUM: 4.6 mmol/L (ref 3.5–5.2)
SODIUM: 140 mmol/L (ref 134–144)
Total Protein: 7.1 g/dL (ref 6.0–8.5)

## 2017-10-04 LAB — CBC WITH DIFFERENTIAL
BASOS ABS: 0 10*3/uL (ref 0.0–0.2)
BASOS: 0 %
EOS (ABSOLUTE): 0.3 10*3/uL (ref 0.0–0.4)
Eos: 4 %
Hematocrit: 39.4 % (ref 34.0–46.6)
Hemoglobin: 12.5 g/dL (ref 11.1–15.9)
IMMATURE GRANULOCYTES: 0 %
Immature Grans (Abs): 0 10*3/uL (ref 0.0–0.1)
Lymphocytes Absolute: 1.7 10*3/uL (ref 0.7–3.1)
Lymphs: 25 %
MCH: 28.3 pg (ref 26.6–33.0)
MCHC: 31.7 g/dL (ref 31.5–35.7)
MCV: 89 fL (ref 79–97)
MONOS ABS: 0.5 10*3/uL (ref 0.1–0.9)
Monocytes: 7 %
NEUTROS PCT: 64 %
Neutrophils Absolute: 4.5 10*3/uL (ref 1.4–7.0)
RBC: 4.42 x10E6/uL (ref 3.77–5.28)
RDW: 15.3 % (ref 12.3–15.4)
WBC: 7 10*3/uL (ref 3.4–10.8)

## 2017-10-04 LAB — LIPID PANEL WITH LDL/HDL RATIO
Cholesterol, Total: 163 mg/dL (ref 100–199)
HDL: 42 mg/dL (ref >39)
LDL Calculated: 93 mg/dL (ref 0–99)
LDl/HDL Ratio: 2.2 ratio (ref 0.0–3.2)
Triglycerides: 140 mg/dL (ref 0–149)
VLDL CHOLESTEROL CAL: 28 mg/dL (ref 5–40)

## 2017-10-04 LAB — T4, FREE: FREE T4: 1.24 ng/dL (ref 0.82–1.77)

## 2017-10-04 LAB — VITAMIN D 25 HYDROXY (VIT D DEFICIENCY, FRACTURES): Vit D, 25-Hydroxy: 28.3 ng/mL — ABNORMAL LOW (ref 30.0–100.0)

## 2017-10-04 LAB — TSH: TSH: 2.9 u[IU]/mL (ref 0.450–4.500)

## 2017-10-04 LAB — INSULIN, RANDOM: INSULIN: 8.7 u[IU]/mL (ref 2.6–24.9)

## 2017-10-04 LAB — T3: T3 TOTAL: 107 ng/dL (ref 71–180)

## 2017-10-06 NOTE — Progress Notes (Signed)
Office: [phone]  /  Fax: [phone]   Dear Dr. Tomi Bamberger,   Thank you for referring Kerri Richardson to our clinic. The following note includes my evaluation and treatment recommendations.  HPI:   Chief Complaint: OBESITY    Kerri Richardson has been referred by Rita Ohara, MD for consultation regarding her obesity and obesity related comorbidities.    Kerri Richardson (MR# [id number]) is a 77 y.o. female who presents on 10/03/2017 for obesity evaluation and treatment. Current BMI is Body mass index is 52.31 kg/m. Kerri Richardson has been struggling with her weight for many years and has been unsuccessful in either losing weight, maintaining weight loss, or reaching her healthy weight goal.     Kerri Richardson states she doesn't cook much.     Kerri Richardson attended our information session and states she is currently in the action stage of change and ready to dedicate time achieving and maintaining a healthier weight. Kerri Richardson is interested in becoming our patient and working on intensive lifestyle modifications including (but not limited to) diet, exercise and weight loss.    Kerri Richardson states she skips meals frequently she is frequently drinking liquids with calories she frequently makes poor food choices   Fatigue Kerri Richardson feels her energy is lower than it should be. This has worsened with weight gain and has not worsened recently. Kerri Richardson admits to daytime somnolence and  denies waking up still tired. Patient is at risk for obstructive sleep apnea. Patent has a history of symptoms of daytime fatigue. Patient generally gets 7 hours of sleep per night, and states they generally have generally restful sleep. Snoring is present. Apneic episodes are not present. Epworth Sleepiness Score is 2.  Dyspnea on exertion Kerri Richardson notes increasing shortness of breath with exercising and seems to be worsening over time with weight gain. She notes getting out of breath sooner with activity than she used to. This has not gotten worse recently.  EKG-incomplete RBBB. Kerri Richardson denies orthopnea.  Diabetes II Kerri Richardson has a diagnosis of diabetes type II. Kerri Richardson is on 27 units of Lantus and Pioglitazone. She was previously on metformin but stopped secondary to elevated creatinine. She denies any hypoglycemic episodes. Last A1c was 6.4 on 05/16/17. She has been working on intensive lifestyle modifications including diet, exercise, and weight loss to help control her blood glucose levels.  Vitamin D Deficiency Kerri Richardson has a diagnosis of vitamin D deficiency. Diagnosis is likely given obesity. She is not currently taking vit D and denies nausea, vomiting or muscle weakness.  Depression Screen Kerri Richardson's Food and Mood (modified PHQ-9) score was  Depression screen PHQ 2/9 10/03/2017  Decreased Interest 2  Down, Depressed, Hopeless 2  PHQ - 2 Score 4  Altered sleeping 0  Tired, decreased energy 2  Change in appetite 0  Feeling bad or failure about yourself  0  Trouble concentrating 0  Moving slowly or fidgety/restless 0  Suicidal thoughts 0  PHQ-9 Score 6    ALLERGIES: No Known Allergies  MEDICATIONS: Current Outpatient Medications on File Prior to Visit  Medication Sig Dispense Refill  . ACCU-CHEK FASTCLIX LANCETS MISC TEST TWO TIMES DAILY 204 each 2  . ACCU-CHEK SMARTVIEW test strip TEST TWICE DAILY 200 each 2  . allopurinol (ZYLOPRIM) 100 MG tablet TAKE 1 TABLET EVERY DAY 90 tablet 1  . amLODipine (NORVASC) 10 MG tablet Take 1 tablet (10 mg total) by mouth daily. 90 tablet 1  . aspirin 81 MG tablet Take 81 mg by mouth daily.      Marland Kitchen  atorvastatin (LIPITOR) 40 MG tablet TAKE 1 TABLET EVERY DAY 90 tablet 1  . BD INSULIN SYRINGE ULTRAFINE 31G X 5/16" 0.5 ML MISC USE EVERY DAY 90 each 3  . Blood Glucose Monitoring Suppl (ACCU-CHEK NANO SMARTVIEW) w/Device KIT 1 each by Does not apply route 2 (two) times daily. BID 1 kit 0  . enalapril (VASOTEC) 10 MG tablet TAKE 1 TABLET EVERY DAY 90 tablet 0  . furosemide (LASIX) 20 MG tablet Take 20 mg by mouth  daily.     . insulin glargine (LANTUS) 100 UNIT/ML injection INJECT 27 UNITS EVERY NIGHT AT BEDTIME (DISCARD OPEN VIAL 28 DAYS AFTER FIRST OPEN) 30 mL 1  . latanoprost (XALATAN) 0.005 % ophthalmic solution Place 1 drop into both eyes at bedtime.      . pioglitazone (ACTOS) 30 MG tablet TAKE 1 TABLET EVERY DAY 90 tablet 1   No current facility-administered medications on file prior to visit.     PAST MEDICAL HISTORY: Past Medical History:  Diagnosis Date  . Arthritis   . CKD (chronic kidney disease) stage 3, GFR 30-59 ml/min (HCC)    Dr. [name]  . Diabetic retinopathy   . DM retinopathy ([place]) 08/17/12   early  . Dyspnea   . Essential hypertension, benign   . Glaucoma   . Gout   . Pure hypercholesterolemia   . Type II or unspecified type diabetes mellitus without mention of complication, not stated as uncontrolled     PAST SURGICAL HISTORY: Past Surgical History:  Procedure Laterality Date  . BRAIN SURGERY  1969   Burr holes in skull to alleviate pressure on the brain (no shunt)  . CATARACT EXTRACTION, BILATERAL  2012   Dr. [name]  . COLONOSCOPY  3/05, 11/2013   due again 2020  . COLPOSCOPY N/A 05/27/2015   Procedure: COLPOSCOPY;  Surgeon: [name], MD;  Location: [facility];  Service: Gynecology;  Laterality: N/A;  . LEEP N/A 05/27/2015   Procedure: LOOP ELECTROSURGICAL EXCISION PROCEDURE (LEEP);  Surgeon: [name], MD;  Location: [facility];  Service: Gynecology;  Laterality: N/A;  . TONSILLECTOMY  age 8    SOCIAL HISTORY: Social History   Tobacco Use  . Smoking status: Former Smoker    Last attempt to quit: 06/14/2000    Years since quitting: 17.3  . Smokeless tobacco: Never Used  Substance Use Topics  . Alcohol use: No    Alcohol/week: 0.0 oz  . Drug use: No    FAMILY HISTORY: Family History  Problem Relation Age of Onset  . Hypertension Mother   . Cancer Mother        lung (nonsmoker)  . Stroke Father   . Hypertension Father   .  Diabetes Father   . Hypertension Sister   . Diabetes Sister   . Heart disease Sister   . Hypertension Sister   . Breast cancer Maternal Aunt 55       91 of old age  . Diabetes Paternal Grandmother     ROS: Review of Systems  Constitutional: Positive for malaise/fatigue. Negative for weight loss.  HENT:       + Decreased hearing + Dentures  Respiratory: Positive for shortness of breath (with exertion).   Cardiovascular: Negative for orthopnea.       + Leg cramping  Gastrointestinal: Negative for nausea and vomiting.  Musculoskeletal:       Negative muscle weakness  Endo/Heme/Allergies: Bruises/bleeds easily.    PHYSICAL EXAM: Blood pressure  138/75, pulse 64, temperature 98 F (36.7 C), temperature source Oral, height '5\' 2"'  (1.575 m), weight 286 lb (129.7 kg), last menstrual period 06/14/1998, SpO2 96 %. Body mass index is 52.31 kg/m. Physical Exam  Constitutional: She is oriented to person, place, and time. She appears well-developed and well-nourished.  HENT:  Head: Normocephalic and atraumatic.  Nose: Nose normal.  Eyes: EOM are normal. No scleral icterus.  Neck: Normal range of motion. Neck supple. No thyromegaly present.  Cardiovascular: Normal rate and regular rhythm.  Pulmonary/Chest: Effort normal. No respiratory distress.  Abdominal: Soft. There is no tenderness.  + Obesity  Musculoskeletal:  Range of Motion normal in all 4 extremities Trace edema noted in bilateral lower extremities  Neurological: She is alert and oriented to person, place, and time. Coordination normal.  Skin: Skin is warm and dry.  Psychiatric: She has a normal mood and affect. Her behavior is normal.  Vitals reviewed.   RECENT LABS AND TESTS: BMET    Component Value Date/Time   NA 140 10/03/2017 1224   K 4.6 10/03/2017 1224   CL 104 10/03/2017 1224   CO2 20 10/03/2017 1224   GLUCOSE 107 (H) 10/03/2017 1224   GLUCOSE 139 (H) 05/16/2017 1040   BUN 37 (H) 10/03/2017 1224    CREATININE 1.54 (H) 10/03/2017 1224   CREATININE 1.64 (H) 05/16/2017 1040   CALCIUM 9.2 10/03/2017 1224   GFRNONAA 33 (L) 10/03/2017 1224   GFRAA 38 (L) 10/03/2017 1224   Lab Results  Component Value Date   HGBA1C 7.0 (H) 10/03/2017   Lab Results  Component Value Date   INSULIN 8.7 10/03/2017   CBC    Component Value Date/Time   WBC 7.0 10/03/2017 1224   WBC 7.4 05/16/2017 1040   RBC 4.42 10/03/2017 1224   RBC 4.51 05/16/2017 1040   HGB 12.5 10/03/2017 1224   HCT 39.4 10/03/2017 1224   PLT 260 05/16/2017 1040   MCV 89 10/03/2017 1224   MCH 28.3 10/03/2017 1224   MCH 27.9 05/16/2017 1040   MCHC 31.7 10/03/2017 1224   MCHC 32.3 05/16/2017 1040   RDW 15.3 10/03/2017 1224   LYMPHSABS 1.7 10/03/2017 1224   MONOABS 558 03/29/2016 1335   EOSABS 0.3 10/03/2017 1224   BASOSABS 0.0 10/03/2017 1224   Iron/TIBC/Ferritin/ %Sat No results found for: IRON, TIBC, FERRITIN, IRONPCTSAT Lipid Panel     Component Value Date/Time   CHOL 163 10/03/2017 1224   TRIG 140 10/03/2017 1224   HDL 42 10/03/2017 1224   CHOLHDL 4.4 05/16/2017 1040   VLDL 24 09/27/2016 1047   LDLCALC 93 10/03/2017 1224   LDLCALC 122 (H) 05/16/2017 1040   Hepatic Function Panel     Component Value Date/Time   PROT 7.1 10/03/2017 1224   ALBUMIN 4.3 10/03/2017 1224   AST 13 10/03/2017 1224   ALT 12 10/03/2017 1224   ALKPHOS 91 10/03/2017 1224   BILITOT 0.3 10/03/2017 1224      Component Value Date/Time   TSH 2.900 10/03/2017 1224   TSH 3.80 05/16/2017 1040   TSH 4.15 09/24/2015 0828    ECG  shows NSR with a rate of 92 BPM INDIRECT CALORIMETER done today shows a VO2 of 201 and a REE of 1399.  Her calculated basal metabolic rate is 5009 thus her basal metabolic rate is worse than expected.    ASSESSMENT AND PLAN: Other fatigue - Plan: EKG 12-Lead, Comprehensive metabolic panel, CBC With Differential, Lipid Panel With LDL/HDL Ratio, T3, T4, free, TSH  Dyspnea on exertion  Controlled type 2  diabetes mellitus with complication, with long-term current use of insulin ([place]) - Plan: Comprehensive metabolic panel, Hemoglobin A1c, Insulin, random  Vitamin D deficiency - Plan: VITAMIN D 25 Hydroxy (Vit-D Deficiency, Fractures)  Depression screening  Class 3 severe obesity with serious comorbidity and body mass index (BMI) of 50.0 to 59.9 in adult, unspecified obesity type ([place])  PLAN:  Fatigue Kerri Richardson was informed that her fatigue may be related to obesity, depression or many other causes. Labs will be ordered, and in the meanwhile Donyel has agreed to work on diet, exercise and weight loss to help with fatigue. Proper sleep hygiene was discussed including the need for 7-8 hours of quality sleep each night. A sleep study was not ordered based on symptoms and Epworth score.  Dyspnea on exertion Kerri Richardson's shortness of breath appears to be obesity related and exercise induced. She has agreed to work on weight loss and gradually increase exercise to treat her exercise induced shortness of breath. If Helma follows our instructions and loses weight without improvement of her shortness of breath, we will plan to refer to pulmonology. We will monitor this condition regularly. Kerri Richardson agrees to this plan.  Diabetes II Joslynn has been given extensive diabetes education by myself today including ideal fasting and post-prandial blood glucose readings, individual ideal Hgb A1c goals and hypoglycemia prevention. We discussed the importance of good blood sugar control to decrease the likelihood of diabetic complications such as nephropathy, neuropathy, limb loss, blindness, coronary artery disease, and death. We discussed the importance of intensive lifestyle modification including diet, exercise and weight loss as the first line treatment for diabetes. Shantera will test BGs 2 times per day and hypoglycemia was discussed. She agrees to continue her diabetes medications as prescribed. We will check labs and Kerri Richardson agrees to  follow up with our clinic in 2 weeks.  Vitamin D Deficiency Kerri Richardson was informed that low vitamin D levels contributes to fatigue and are associated with obesity, breast, and colon cancer. She will follow up for routine testing of vitamin D, at least 2-3 times per year. She was informed of the risk of over-replacement of vitamin D and agrees to not increase her dose unless she discusses this with [name] first. We will check labs and Kerri Richardson agrees to follow up with our clinic in 2 weeks.  Depression Screen Kerri Richardson had a mildly positive depression screening. Depression is commonly associated with obesity and often results in emotional eating behaviors. We will monitor this closely and work on CBT to help improve the non-hunger eating patterns. Referral to Psychology may be required if no improvement is seen as she continues in our clinic.  Obesity Kerri Richardson is currently in the action stage of change and her goal is to continue with weight loss efforts. I recommend Kerri Richardson begin the structured treatment plan as follows:  She has agreed to follow the Category 1 plan + 100 calories Kerri Richardson has been instructed to eventually work up to a goal of 150 minutes of combined cardio and strengthening exercise per week for weight loss and overall health benefits. We discussed the following Behavioral Modification Strategies today: increasing lean protein intake, increasing vegetables, work on meal planning and easy cooking plans, increase H20 intake, no skipping meals, and planning for success   She was informed of the importance of frequent follow up visits to maximize her success with intensive lifestyle modifications for her multiple health conditions. She was informed we would discuss her lab results  at her next visit unless there is a critical issue that needs to be addressed sooner. Luellen agreed to keep her next visit at the agreed upon time to discuss these results.    OBESITY BEHAVIORAL INTERVENTION VISIT  Today's visit was  # 1 out of 22.  Starting weight: 286 lbs Starting date: 10/03/17 Today's weight : 286 lbs Today's date: 10/03/2017 Total lbs lost to date: 0 (Patients must lose 7 lbs in the first 6 months to continue with counseling)   ASK: We discussed the diagnosis of obesity with Brita Romp today and [name] agreed to give [name] permission to discuss obesity behavioral modification therapy today.  ASSESS: Gail has the diagnosis of obesity and her BMI today is 52.3 Aldora is in the action stage of change   ADVISE: Tabbetha was educated on the multiple health risks of obesity as well as the benefit of weight loss to improve her health. She was advised of the need for long term treatment and the importance of lifestyle modifications.  AGREE: Multiple dietary modification options and treatment options were discussed and  Deborha agreed to the above obesity treatment plan.   I, Trixie Dredge, am acting as transcriptionist for Ilene Qua, MD   I have reviewed the above documentation for accuracy and completeness, and I agree with the above. - Ilene Qua, MD

## 2017-10-12 ENCOUNTER — Other Ambulatory Visit: Payer: Self-pay | Admitting: Family Medicine

## 2017-10-12 DIAGNOSIS — M109 Gout, unspecified: Secondary | ICD-10-CM

## 2017-10-12 DIAGNOSIS — N183 Chronic kidney disease, stage 3 unspecified: Secondary | ICD-10-CM

## 2017-10-12 DIAGNOSIS — E1122 Type 2 diabetes mellitus with diabetic chronic kidney disease: Secondary | ICD-10-CM

## 2017-10-17 ENCOUNTER — Ambulatory Visit (INDEPENDENT_AMBULATORY_CARE_PROVIDER_SITE_OTHER): Payer: Medicare PPO | Admitting: Family Medicine

## 2017-10-17 VITALS — BP 127/69 | HR 86 | Temp 97.9°F | Ht 62.0 in | Wt 277.0 lb

## 2017-10-17 DIAGNOSIS — Z794 Long term (current) use of insulin: Secondary | ICD-10-CM | POA: Diagnosis not present

## 2017-10-17 DIAGNOSIS — Z6841 Body Mass Index (BMI) 40.0 and over, adult: Secondary | ICD-10-CM | POA: Diagnosis not present

## 2017-10-17 DIAGNOSIS — E1121 Type 2 diabetes mellitus with diabetic nephropathy: Secondary | ICD-10-CM | POA: Diagnosis not present

## 2017-10-17 DIAGNOSIS — E559 Vitamin D deficiency, unspecified: Secondary | ICD-10-CM

## 2017-10-17 MED ORDER — VITAMIN D (ERGOCALCIFEROL) 1.25 MG (50000 UNIT) PO CAPS: 50000.0000 [IU] | ORAL_CAPSULE | ORAL | 0 refills | Status: DC

## 2017-10-18 NOTE — Progress Notes (Signed)
Office: [phone]  /  Fax: [phone]   HPI:   Chief Complaint: OBESITY Kerri Richardson is here to discuss her progress with her obesity treatment plan. She is on the Category 1 plan + 100 calories and is following her eating plan approximately 100 % of the time. She states she is exercising 0 minutes 0 times per week. Kerri Richardson states she is hungry after breakfast.  Her weight is 277 lb (125.6 kg) today and has had a weight loss of 9 pounds over a period of 2 weeks since her last visit. She has lost 9 lbs since starting treatment with [name].  Diabetes II Kerri Richardson has a diagnosis of diabetes type II. Kerri Richardson states fasting BGs range between 70 and 100 and post prandials in 110's. Previously discussed hypoglycemia protocol. Last Hgb A1c of 7.0. She has been working on intensive lifestyle modifications including diet, exercise, and weight loss to help control her blood glucose levels.  Diabetic Nephropathy Kerri Richardson sees Dr. [name], last appointment 1 year ago.  Vitamin D Deficiency Kerri Richardson has a diagnosis of vitamin D deficiency. She is not on Vit D supplement currently and denies nausea, vomiting or muscle weakness.  ALLERGIES: No Known Allergies  MEDICATIONS: Current Outpatient Medications on File Prior to Visit  Medication Sig Dispense Refill  . ACCU-CHEK FASTCLIX LANCETS MISC TEST TWO TIMES DAILY 204 each 2  . ACCU-CHEK SMARTVIEW test strip TEST TWICE DAILY 200 each 2  . allopurinol (ZYLOPRIM) 100 MG tablet TAKE 1 TABLET EVERY DAY 90 tablet 0  . amLODipine (NORVASC) 10 MG tablet Take 1 tablet (10 mg total) by mouth daily. 90 tablet 1  . aspirin 81 MG tablet Take 81 mg by mouth daily.      Marland Kitchen atorvastatin (LIPITOR) 40 MG tablet TAKE 1 TABLET EVERY DAY 90 tablet 0  . BD INSULIN SYRINGE ULTRAFINE 31G X 5/16" 0.5 ML MISC USE EVERY DAY 90 each 3  . Blood Glucose Monitoring Suppl (ACCU-CHEK NANO SMARTVIEW) w/Device KIT 1 each by Does not apply route 2 (two) times daily. BID 1 kit 0  . enalapril (VASOTEC) 10 MG  tablet TAKE 1 TABLET EVERY DAY 90 tablet 0  . furosemide (LASIX) 20 MG tablet Take 20 mg by mouth daily.     . insulin glargine (LANTUS) 100 UNIT/ML injection INJECT 27 UNITS EVERY NIGHT AT BEDTIME (DISCARD OPEN VIAL 28 DAYS AFTER FIRST OPEN) 30 mL 1  . latanoprost (XALATAN) 0.005 % ophthalmic solution Place 1 drop into both eyes at bedtime.      . pioglitazone (ACTOS) 30 MG tablet TAKE 1 TABLET EVERY DAY 90 tablet 0   No current facility-administered medications on file prior to visit.     PAST MEDICAL HISTORY: Past Medical History:  Diagnosis Date  . Arthritis   . CKD (chronic kidney disease) stage 3, GFR 30-59 ml/min (HCC)    Dr. [name]  . Diabetic retinopathy   . DM retinopathy ([place]) 08/17/12   early  . Dyspnea   . Essential hypertension, benign   . Glaucoma   . Gout   . Pure hypercholesterolemia   . Type II or unspecified type diabetes mellitus without mention of complication, not stated as uncontrolled     PAST SURGICAL HISTORY: Past Surgical History:  Procedure Laterality Date  . BRAIN SURGERY  1969   Burr holes in skull to alleviate pressure on the brain (no shunt)  . CATARACT EXTRACTION, BILATERAL  2012   Dr. [name]  . COLONOSCOPY  3/05, 11/2013   due  again 2020  . COLPOSCOPY N/A 05/27/2015   Procedure: COLPOSCOPY;  Surgeon: [name], MD;  Location: [facility];  Service: Gynecology;  Laterality: N/A;  . LEEP N/A 05/27/2015   Procedure: LOOP ELECTROSURGICAL EXCISION PROCEDURE (LEEP);  Surgeon: [name], MD;  Location: [facility];  Service: Gynecology;  Laterality: N/A;  . TONSILLECTOMY  age 18    SOCIAL HISTORY: Social History   Tobacco Use  . Smoking status: Former Smoker    Last attempt to quit: 06/14/2000    Years since quitting: 17.3  . Smokeless tobacco: Never Used  Substance Use Topics  . Alcohol use: No    Alcohol/week: 0.0 oz  . Drug use: No    FAMILY HISTORY: Family History  Problem Relation Age of Onset  . Hypertension  Mother   . Cancer Mother        lung (nonsmoker)  . Stroke Father   . Hypertension Father   . Diabetes Father   . Hypertension Sister   . Diabetes Sister   . Heart disease Sister   . Hypertension Sister   . Breast cancer Maternal Aunt 54       52 of old age  . Diabetes Paternal Grandmother     ROS: Review of Systems  Constitutional: Positive for weight loss.  Gastrointestinal: Negative for nausea and vomiting.  Musculoskeletal:       Negative muscle weakness    PHYSICAL EXAM: Blood pressure 127/69, pulse 86, temperature 97.9 F (36.6 C), temperature source Oral, height '5\' 2"'  (1.575 Richardson), weight 277 lb (125.6 kg), last menstrual period 06/14/1998, SpO2 97 %. Body mass index is 50.66 kg/Richardson. Physical Exam  Constitutional: She is oriented to person, place, and time. She appears well-developed and well-nourished.  Cardiovascular: Normal rate.  Pulmonary/Chest: Effort normal.  Musculoskeletal: Normal range of motion.  Neurological: She is oriented to person, place, and time.  Skin: Skin is warm and dry.  Psychiatric: She has a normal mood and affect. Her behavior is normal.  Vitals reviewed.   RECENT LABS AND TESTS: BMET    Component Value Date/Time   NA 140 10/03/2017 1224   K 4.6 10/03/2017 1224   CL 104 10/03/2017 1224   CO2 20 10/03/2017 1224   GLUCOSE 107 (H) 10/03/2017 1224   GLUCOSE 139 (H) 05/16/2017 1040   BUN 37 (H) 10/03/2017 1224   CREATININE 1.54 (H) 10/03/2017 1224   CREATININE 1.64 (H) 05/16/2017 1040   CALCIUM 9.2 10/03/2017 1224   GFRNONAA 33 (L) 10/03/2017 1224   GFRAA 38 (L) 10/03/2017 1224   Lab Results  Component Value Date   HGBA1C 7.0 (H) 10/03/2017   HGBA1C 6.4 05/16/2017   HGBA1C 6.6 07/29/2016   HGBA1C 5.9 03/29/2016   HGBA1C 6.3 09/24/2015   Lab Results  Component Value Date   INSULIN 8.7 10/03/2017   CBC    Component Value Date/Time   WBC 7.0 10/03/2017 1224   WBC 7.4 05/16/2017 1040   RBC 4.42 10/03/2017 1224   RBC 4.51  05/16/2017 1040   HGB 12.5 10/03/2017 1224   HCT 39.4 10/03/2017 1224   PLT 260 05/16/2017 1040   MCV 89 10/03/2017 1224   MCH 28.3 10/03/2017 1224   MCH 27.9 05/16/2017 1040   MCHC 31.7 10/03/2017 1224   MCHC 32.3 05/16/2017 1040   RDW 15.3 10/03/2017 1224   LYMPHSABS 1.7 10/03/2017 1224   MONOABS 558 03/29/2016 1335   EOSABS 0.3 10/03/2017 1224   BASOSABS 0.0  10/03/2017 1224   Iron/TIBC/Ferritin/ %Sat No results found for: IRON, TIBC, FERRITIN, IRONPCTSAT Lipid Panel     Component Value Date/Time   CHOL 163 10/03/2017 1224   TRIG 140 10/03/2017 1224   HDL 42 10/03/2017 1224   CHOLHDL 4.4 05/16/2017 1040   VLDL 24 09/27/2016 1047   LDLCALC 93 10/03/2017 1224   LDLCALC 122 (H) 05/16/2017 1040   Hepatic Function Panel     Component Value Date/Time   PROT 7.1 10/03/2017 1224   ALBUMIN 4.3 10/03/2017 1224   AST 13 10/03/2017 1224   ALT 12 10/03/2017 1224   ALKPHOS 91 10/03/2017 1224   BILITOT 0.3 10/03/2017 1224      Component Value Date/Time   TSH 2.900 10/03/2017 1224   TSH 3.80 05/16/2017 1040   TSH 4.15 09/24/2015 0828  Results for Rahmani, Kerri Richardson "MARIE" (MRN [id number]) as of 10/18/2017 15:51  Ref. Range 10/03/2017 12:24  Vitamin D, 25-Hydroxy Latest Ref Range: 30.0 - 100.0 ng/mL 28.3 (L)    ASSESSMENT AND PLAN: Type 2 diabetes mellitus with diabetic nephropathy, with long-term current use of insulin ([place])  Diabetic nephropathy associated with type 2 diabetes mellitus ([place])  Vitamin D deficiency - Plan: Vitamin D, Ergocalciferol, (DRISDOL) 50000 units CAPS capsule  Class 3 severe obesity with serious comorbidity and body mass index (BMI) of 50.0 to 59.9 in adult, unspecified obesity type ([place])  PLAN:  Diabetes II Kerri Richardson has been given extensive diabetes education by myself today including ideal fasting and post-prandial blood glucose readings, individual ideal Hgb A1c goals and hypoglycemia prevention. We discussed the importance of good blood sugar control  to decrease the likelihood of diabetic complications such as nephropathy, neuropathy, limb loss, blindness, coronary artery disease, and death. We discussed the importance of intensive lifestyle modification including diet, exercise and weight loss as the first line treatment for diabetes. Kerri Richardson agrees to continue her diabetes medications and she is to notify office if fasting BGs <70, and we will follow up on Hgb A1c in 3 months. Kerri Richardson agrees to follow up with our clinic in 2 weeks.  Diabetic Nephropathy Kerri Richardson will follow up with [facility] and she agrees to follow up our clinic in 2 weeks.   Vitamin D Deficiency Kerri Richardson was informed that low vitamin D levels contributes to fatigue and are associated with obesity, breast, and colon cancer. Kerri Richardson agrees to start prescription Vit D '@50' ,000 IU every week #4 with no refills. She will follow up for routine testing of vitamin D, at least 2-3 times per year. She was informed of the risk of over-replacement of vitamin D and agrees to not increase her dose unless she discusses this with [name] first. Ailynn agrees to follow up with our clinic in 2 weeks.  Obesity Kerri Richardson is currently in the action stage of change. As such, her goal is to continue with weight loss efforts She has agreed to follow the Category 2 plan Kerri Richardson has been instructed to work up to a goal of 150 minutes of combined cardio and strengthening exercise per week for weight loss and overall health benefits. We discussed the following Behavioral Modification Strategies today: increasing lean protein intake, increasing vegetables, work on meal planning and easy cooking plans, and planning for success   Jayona has agreed to follow up with our clinic in 2 weeks. She was informed of the importance of frequent follow up visits to maximize her success with intensive lifestyle modifications for her multiple health conditions.   OBESITY BEHAVIORAL INTERVENTION  VISIT  Today's visit was # 2 out of  22.  Starting weight: 286 lbs Starting date: 10/03/17 Today's weight : 277 lbs Today's date: 10/17/2017 Total lbs lost to date: 9 (Patients must lose 7 lbs in the first 6 months to continue with counseling)   ASK: We discussed the diagnosis of obesity with Brita Romp today and [name] agreed to give [name] permission to discuss obesity behavioral modification therapy today.  ASSESS: Caitlen has the diagnosis of obesity and her BMI today is 50.65 Naleigha is in the action stage of change   ADVISE: Margrette was educated on the multiple health risks of obesity as well as the benefit of weight loss to improve her health. She was advised of the need for long term treatment and the importance of lifestyle modifications.  AGREE: Multiple dietary modification options and treatment options were discussed and  Sonjia agreed to the above obesity treatment plan.  I, Trixie Dredge, am acting as transcriptionist for Ilene Qua, MD  I have reviewed the above documentation for accuracy and completeness, and I agree with the above. - Ilene Qua, MD

## 2017-11-01 ENCOUNTER — Ambulatory Visit (INDEPENDENT_AMBULATORY_CARE_PROVIDER_SITE_OTHER): Payer: Medicare PPO | Admitting: Family Medicine

## 2017-11-01 VITALS — BP 118/66 | HR 71 | Temp 98.0°F | Ht 62.0 in | Wt 275.0 lb

## 2017-11-01 DIAGNOSIS — I10 Essential (primary) hypertension: Secondary | ICD-10-CM

## 2017-11-01 DIAGNOSIS — Z6841 Body Mass Index (BMI) 40.0 and over, adult: Secondary | ICD-10-CM | POA: Diagnosis not present

## 2017-11-01 DIAGNOSIS — E1121 Type 2 diabetes mellitus with diabetic nephropathy: Secondary | ICD-10-CM

## 2017-11-01 DIAGNOSIS — E559 Vitamin D deficiency, unspecified: Secondary | ICD-10-CM

## 2017-11-01 DIAGNOSIS — Z794 Long term (current) use of insulin: Secondary | ICD-10-CM

## 2017-11-01 MED ORDER — VITAMIN D (ERGOCALCIFEROL) 1.25 MG (50000 UNIT) PO CAPS: 50000.0000 [IU] | ORAL_CAPSULE | ORAL | 0 refills | Status: DC

## 2017-11-03 NOTE — Progress Notes (Signed)
Office: [phone]  /  Fax: [phone]   HPI:   Chief Complaint: OBESITY Kerri Richardson is here to discuss her progress with her obesity treatment plan. She is on the Category 2 plan and is following her eating plan approximately 100 % of the time. She states she is exercising 0 minutes 0 times per week. Kerri Richardson si still doing well on her meal plan. She is planning to celebrate memorial day at a barbecue. Her weight is 275 lb (124.7 kg) today and has had a weight loss of 2 pounds over a period of 2 weeks since her last visit. She has lost 11 lbs since starting treatment with [name].  Vitamin D deficiency Kerri Richardson has a diagnosis of vitamin D deficiency. She  Hasn't started taking vit D yet and she admits fatigue but denies nausea, vomiting or muscle weakness.  Diabetes II Kerri Richardson has a diagnosis of diabetes type II. Kerri Richardson states fasting BGs range between 83 and 118, post prandial range between 116 and 145. Last A1c was at 7.0 She has been working on intensive lifestyle modifications including diet, exercise, and weight loss to help control her blood glucose levels.  Hypertension Kerri Richardson is a 77 y.o. female with hypertension. Kerri Richardson denies dizziness or lightheadedness. She is working weight loss to help control her blood pressure with the goal of decreasing her risk of heart attack and stroke. Kerri Richardson blood pressure is currently controlled.  ALLERGIES: No Known Allergies  MEDICATIONS: Current Outpatient Medications on File Prior to Visit  Medication Sig Dispense Refill  . ACCU-CHEK FASTCLIX LANCETS MISC TEST TWO TIMES DAILY 204 each 2  . ACCU-CHEK SMARTVIEW test strip TEST TWICE DAILY 200 each 2  . allopurinol (ZYLOPRIM) 100 MG tablet TAKE 1 TABLET EVERY DAY 90 tablet 0  . amLODipine (NORVASC) 10 MG tablet Take 1 tablet (10 mg total) by mouth daily. 90 tablet 1  . aspirin 81 MG tablet Take 81 mg by mouth daily.      Marland Kitchen atorvastatin (LIPITOR) 40 MG tablet TAKE 1 TABLET EVERY DAY 90 tablet  0  . BD INSULIN SYRINGE ULTRAFINE 31G X 5/16" 0.5 ML MISC USE EVERY DAY 90 each 3  . Blood Glucose Monitoring Suppl (ACCU-CHEK NANO SMARTVIEW) w/Device KIT 1 each by Does not apply route 2 (two) times daily. BID 1 kit 0  . enalapril (VASOTEC) 10 MG tablet TAKE 1 TABLET EVERY DAY 90 tablet 0  . furosemide (LASIX) 20 MG tablet Take 20 mg by mouth daily.     . insulin glargine (LANTUS) 100 UNIT/ML injection INJECT 27 UNITS EVERY NIGHT AT BEDTIME (DISCARD OPEN VIAL 28 DAYS AFTER FIRST OPEN) (Patient taking differently: INJECT 24 UNITS EVERY NIGHT AT BEDTIME (DISCARD OPEN VIAL 28 DAYS AFTER FIRST OPEN)) 30 mL 1  . latanoprost (XALATAN) 0.005 % ophthalmic solution Place 1 drop into both eyes at bedtime.      . pioglitazone (ACTOS) 30 MG tablet TAKE 1 TABLET EVERY DAY 90 tablet 0   No current facility-administered medications on file prior to visit.     PAST MEDICAL HISTORY: Past Medical History:  Diagnosis Date  . Arthritis   . CKD (chronic kidney disease) stage 3, GFR 30-59 ml/min (HCC)    Dr. [name]  . Diabetic retinopathy   . DM retinopathy ([place]) 08/17/12   early  . Dyspnea   . Essential hypertension, benign   . Glaucoma   . Gout   . Pure hypercholesterolemia   . Type II or unspecified type  diabetes mellitus without mention of complication, not stated as uncontrolled     PAST SURGICAL HISTORY: Past Surgical History:  Procedure Laterality Date  . BRAIN SURGERY  1969   Burr holes in skull to alleviate pressure on the brain (no shunt)  . CATARACT EXTRACTION, BILATERAL  2012   Dr. [name]  . COLONOSCOPY  3/05, 11/2013   due again 2020  . COLPOSCOPY N/A 05/27/2015   Procedure: COLPOSCOPY;  Surgeon: [name], MD;  Location: [facility];  Service: Gynecology;  Laterality: N/A;  . LEEP N/A 05/27/2015   Procedure: LOOP ELECTROSURGICAL EXCISION PROCEDURE (LEEP);  Surgeon: [name], MD;  Location: [facility];  Service: Gynecology;  Laterality: N/A;  . TONSILLECTOMY  age  86    SOCIAL HISTORY: Social History   Tobacco Use  . Smoking status: Former Smoker    Last attempt to quit: 06/14/2000    Years since quitting: 17.4  . Smokeless tobacco: Never Used  Substance Use Topics  . Alcohol use: No    Alcohol/week: 0.0 oz  . Drug use: No    FAMILY HISTORY: Family History  Problem Relation Age of Onset  . Hypertension Mother   . Cancer Mother        lung (nonsmoker)  . Stroke Father   . Hypertension Father   . Diabetes Father   . Hypertension Sister   . Diabetes Sister   . Heart disease Sister   . Hypertension Sister   . Breast cancer Maternal Aunt 70       81 of old age  . Diabetes Paternal Grandmother     ROS: Review of Systems  Constitutional: Positive for malaise/fatigue and weight loss.  Gastrointestinal: Negative for nausea and vomiting.  Musculoskeletal:       Negative for muscle weakness  Neurological: Negative for dizziness.       Negative for lightheadedness    PHYSICAL EXAM: Blood pressure 118/66, pulse 71, temperature 98 F (36.7 C), temperature source Oral, height 5' 2" (1.575 m), weight 275 lb (124.7 kg), last menstrual period 06/14/1998, SpO2 96 %. Body mass index is 50.3 kg/m. Physical Exam  Constitutional: She is oriented to person, place, and time. She appears well-developed and well-nourished.  Cardiovascular: Normal rate.  Pulmonary/Chest: Effort normal.  Musculoskeletal: Normal range of motion.  Neurological: She is oriented to person, place, and time.  Skin: Skin is warm and dry.  Psychiatric: She has a normal mood and affect. Her behavior is normal.  Vitals reviewed.   RECENT LABS AND TESTS: BMET    Component Value Date/Time   NA 140 10/03/2017 1224   K 4.6 10/03/2017 1224   CL 104 10/03/2017 1224   CO2 20 10/03/2017 1224   GLUCOSE 107 (H) 10/03/2017 1224   GLUCOSE 139 (H) 05/16/2017 1040   BUN 37 (H) 10/03/2017 1224   CREATININE 1.54 (H) 10/03/2017 1224   CREATININE 1.64 (H) 05/16/2017 1040    CALCIUM 9.2 10/03/2017 1224   GFRNONAA 33 (L) 10/03/2017 1224   GFRAA 38 (L) 10/03/2017 1224   Lab Results  Component Value Date   HGBA1C 7.0 (H) 10/03/2017   HGBA1C 6.4 05/16/2017   HGBA1C 6.6 07/29/2016   HGBA1C 5.9 03/29/2016   HGBA1C 6.3 09/24/2015   Lab Results  Component Value Date   INSULIN 8.7 10/03/2017   CBC    Component Value Date/Time   WBC 7.0 10/03/2017 1224   WBC 7.4 05/16/2017 1040   RBC 4.42 10/03/2017 1224  RBC 4.51 05/16/2017 1040   HGB 12.5 10/03/2017 1224   HCT 39.4 10/03/2017 1224   PLT 260 05/16/2017 1040   MCV 89 10/03/2017 1224   MCH 28.3 10/03/2017 1224   MCH 27.9 05/16/2017 1040   MCHC 31.7 10/03/2017 1224   MCHC 32.3 05/16/2017 1040   RDW 15.3 10/03/2017 1224   LYMPHSABS 1.7 10/03/2017 1224   MONOABS 558 03/29/2016 1335   EOSABS 0.3 10/03/2017 1224   BASOSABS 0.0 10/03/2017 1224   Iron/TIBC/Ferritin/ %Sat No results found for: IRON, TIBC, FERRITIN, IRONPCTSAT Lipid Panel     Component Value Date/Time   CHOL 163 10/03/2017 1224   TRIG 140 10/03/2017 1224   HDL 42 10/03/2017 1224   CHOLHDL 4.4 05/16/2017 1040   VLDL 24 09/27/2016 1047   LDLCALC 93 10/03/2017 1224   LDLCALC 122 (H) 05/16/2017 1040   Hepatic Function Panel     Component Value Date/Time   PROT 7.1 10/03/2017 1224   ALBUMIN 4.3 10/03/2017 1224   AST 13 10/03/2017 1224   ALT 12 10/03/2017 1224   ALKPHOS 91 10/03/2017 1224   BILITOT 0.3 10/03/2017 1224      Component Value Date/Time   TSH 2.900 10/03/2017 1224   TSH 3.80 05/16/2017 1040   TSH 4.15 09/24/2015 0828   Results for Scheurich, Mahealani M "MARIE" (MRN [id number]) as of 11/03/2017 08:44  Ref. Range 10/03/2017 12:24  Vitamin D, 25-Hydroxy Latest Ref Range: 30.0 - 100.0 ng/mL 28.3 (L)   ASSESSMENT AND PLAN: Type 2 diabetes mellitus with diabetic nephropathy, with long-term current use of insulin (HCC)  Vitamin D deficiency - Plan: Vitamin D, Ergocalciferol, (DRISDOL) 50000 units CAPS capsule  Essential  hypertension  Class 3 severe obesity with serious comorbidity and body mass index (BMI) of 50.0 to 59.9 in adult, unspecified obesity type ([place])  PLAN:  Vitamin D Deficiency Kerri Richardson was informed that low vitamin D levels contributes to fatigue and are associated with obesity, breast, and colon cancer. She agrees to start prescription Vit D _0 ,000 IU every week #4 with no refills and will follow up for routine testing of vitamin D, at least 2-3 times per year. She was informed of the risk of over-replacement of vitamin D and agrees to not increase her dose unless she discusses this with [name] first. Kerri Richardson agrees to follow up as directed.  Diabetes II Kerri Richardson has been given extensive diabetes education by myself today including ideal fasting and post-prandial blood glucose readings, individual ideal Hgb A1c goals and hypoglycemia prevention. We discussed the importance of good blood sugar control to decrease the likelihood of diabetic complications such as nephropathy, neuropathy, limb loss, blindness, coronary artery disease, and death. We discussed the importance of intensive lifestyle modification including diet, exercise and weight loss as the first line treatment for diabetes. Kerri Richardson agrees to decrease lantus to 24 units qHS and call if fasting blood sugar is <94. Kerri Richardson agrees to follow up at the agreed upon time.  Hypertension We discussed sodium restriction, working on healthy weight loss, and a regular exercise program as the means to achieve improved blood pressure control. Kerri Richardson agreed with this plan and agreed to follow up as directed. We will continue to monitor her blood pressure as well as her progress with the above lifestyle modifications. She will continue her medications as prescribed and will watch for signs of hypotension as she continues her lifestyle modifications.  Obesity Kerri Richardson is currently in the action stage of change. As such, her goal is to continue with  weight loss efforts She has  agreed to follow the Category 2 plan Kerri Richardson has been instructed to work up to a goal of 150 minutes of combined cardio and strengthening exercise per week for weight loss and overall health benefits. We discussed the following Behavioral Modification Strategies today: planning for success, better snacking choices, increasing lean protein intake, increasing vegetables and work on meal planning and easy cooking plans  Kerri Richardson has agreed to follow up with our clinic in 2 to 3 weeks. She was informed of the importance of frequent follow up visits to maximize her success with intensive lifestyle modifications for her multiple health conditions.   OBESITY BEHAVIORAL INTERVENTION VISIT  Today's visit was # 3 out of 22.  Starting weight: 286 lbs Starting date: 10/03/17 Today's weight : 275 lbs Today's date: 11/01/2017 Total lbs lost to date: 11 (Patients must lose 7 lbs in the first 6 months to continue with counseling)   ASK: We discussed the diagnosis of obesity with Kerri Richardson today and Kerri Richardson agreed to give [name] permission to discuss obesity behavioral modification therapy today.  ASSESS: Kerri Richardson has the diagnosis of obesity and her BMI today is 50.29 Kerri Richardson is in the action stage of change   ADVISE: Kerri Richardson was educated on the multiple health risks of obesity as well as the benefit of weight loss to improve her health. She was advised of the need for long term treatment and the importance of lifestyle modifications.  AGREE: Multiple dietary modification options and treatment options were discussed and  Zehava agreed to the above obesity treatment plan.  I, Doreene Nest, am acting as transcriptionist for Eber Jones, MD  I have reviewed the above documentation for accuracy and completeness, and I agree with the above. - Ilene Qua, MD

## 2017-11-10 ENCOUNTER — Other Ambulatory Visit: Payer: Self-pay | Admitting: Family Medicine

## 2017-11-10 DIAGNOSIS — I10 Essential (primary) hypertension: Secondary | ICD-10-CM

## 2017-11-10 DIAGNOSIS — N183 Chronic kidney disease, stage 3 (moderate): Principal | ICD-10-CM

## 2017-11-10 DIAGNOSIS — E1122 Type 2 diabetes mellitus with diabetic chronic kidney disease: Secondary | ICD-10-CM

## 2017-11-22 ENCOUNTER — Ambulatory Visit (INDEPENDENT_AMBULATORY_CARE_PROVIDER_SITE_OTHER): Payer: Medicare PPO | Admitting: Physician Assistant

## 2017-11-22 VITALS — BP 135/74 | HR 105 | Temp 98.1°F | Ht 62.0 in | Wt 268.0 lb

## 2017-11-22 DIAGNOSIS — Z794 Long term (current) use of insulin: Secondary | ICD-10-CM

## 2017-11-22 DIAGNOSIS — E1129 Type 2 diabetes mellitus with other diabetic kidney complication: Secondary | ICD-10-CM | POA: Diagnosis not present

## 2017-11-22 DIAGNOSIS — R5383 Other fatigue: Secondary | ICD-10-CM

## 2017-11-22 DIAGNOSIS — E559 Vitamin D deficiency, unspecified: Secondary | ICD-10-CM

## 2017-11-22 DIAGNOSIS — Z6841 Body Mass Index (BMI) 40.0 and over, adult: Secondary | ICD-10-CM | POA: Diagnosis not present

## 2017-11-22 MED ORDER — VITAMIN D (ERGOCALCIFEROL) 1.25 MG (50000 UNIT) PO CAPS: 50000.0000 [IU] | ORAL_CAPSULE | ORAL | 0 refills | Status: DC

## 2017-11-22 NOTE — Progress Notes (Signed)
Office: [phone]  /  Fax: [phone]   HPI:   Chief Complaint: OBESITY Kerri Richardson is here to discuss her progress with her obesity treatment plan. She is on the Category 2 plan and is following her eating plan approximately 90 % of the time. She states she is exercising 0 minutes 0 times per week. Kerri Richardson continues to do well with weight loss. She is mindful of her eating and follows the meal plan. Her weight is 268 lb (121.6 kg) today and has had a weight loss of 7 pounds over a period of 3 weeks since her last visit. She has lost 18 lbs since starting treatment with [name].  Vitamin D deficiency Kerri Richardson has a diagnosis of vitamin D deficiency. She is currently taking vit D and denies nausea, vomiting or muscle weakness.  Fatigue Kerri Richardson admits to fatigue and this is not improved with weight loss. Kerri Richardson admits to daytime somnolence and wakes up un rested. She admits to snoring and denies apnea at night. She denies chest pain or dyspnea.  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 Kerri Richardson did not bring her blood sugar log in for review. She states fasting BGs are in the 130's and post prandial is in the 150 range. Last A1c was at 7.0. She is on Actos and insulin and states she was instructed to take 27 units of insulin, but has been taking 20 units. Kerri Richardson denies any hypoglycemic episodes. She has been working on intensive lifestyle modifications including diet, exercise, and weight loss to help control her blood glucose levels.  ALLERGIES: No Known Allergies  MEDICATIONS: Current Outpatient Medications on File Prior to Visit  Medication Sig Dispense Refill  . ACCU-CHEK FASTCLIX LANCETS MISC TEST TWO TIMES DAILY 204 each 2  . ACCU-CHEK SMARTVIEW test strip TEST TWICE DAILY 200 each 2  . allopurinol (ZYLOPRIM) 100 MG tablet TAKE 1 TABLET EVERY DAY 90 tablet 0  . amLODipine (NORVASC) 10 MG tablet Take 1 tablet (10 mg total) by mouth daily. 90  tablet 1  . aspirin 81 MG tablet Take 81 mg by mouth daily.      Marland Kitchen atorvastatin (LIPITOR) 40 MG tablet TAKE 1 TABLET EVERY DAY 90 tablet 0  . BD INSULIN SYRINGE ULTRAFINE 31G X 5/16" 0.5 ML MISC USE EVERY DAY 90 each 3  . Blood Glucose Monitoring Suppl (ACCU-CHEK NANO SMARTVIEW) w/Device KIT 1 each by Does not apply route 2 (two) times daily. BID 1 kit 0  . enalapril (VASOTEC) 10 MG tablet TAKE 1 TABLET EVERY DAY 90 tablet 0  . furosemide (LASIX) 20 MG tablet Take 20 mg by mouth daily.     . insulin glargine (LANTUS) 100 UNIT/ML injection INJECT 27 UNITS EVERY NIGHT AT BEDTIME (DISCARD OPEN VIAL 28 DAYS AFTER FIRST OPEN) (Patient taking differently: INJECT 24 UNITS EVERY NIGHT AT BEDTIME (DISCARD OPEN VIAL 28 DAYS AFTER FIRST OPEN)) 30 mL 1  . latanoprost (XALATAN) 0.005 % ophthalmic solution Place 1 drop into both eyes at bedtime.      . pioglitazone (ACTOS) 30 MG tablet TAKE 1 TABLET EVERY DAY 90 tablet 0  . Vitamin D, Ergocalciferol, (DRISDOL) 50000 units CAPS capsule Take 1 capsule (50,000 Units total) by mouth every 7 (seven) days. 4 capsule 0   No current facility-administered medications on file prior to visit.     PAST MEDICAL HISTORY: Past Medical History:  Diagnosis Date  . Arthritis   . CKD (chronic  kidney disease) stage 3, GFR 30-59 ml/min (HCC)    Dr. [name]  . Diabetic retinopathy   . DM retinopathy ([place]) 08/17/12   early  . Dyspnea   . Essential hypertension, benign   . Glaucoma   . Gout   . Pure hypercholesterolemia   . Type II or unspecified type diabetes mellitus without mention of complication, not stated as uncontrolled     PAST SURGICAL HISTORY: Past Surgical History:  Procedure Laterality Date  . BRAIN SURGERY  1969   Burr holes in skull to alleviate pressure on the brain (no shunt)  . CATARACT EXTRACTION, BILATERAL  2012   Dr. [name]  . COLONOSCOPY  3/05, 11/2013   due again 2020  . COLPOSCOPY N/A 05/27/2015   Procedure: COLPOSCOPY;  Surgeon: [name], MD;  Location: [facility];  Service: Gynecology;  Laterality: N/A;  . LEEP N/A 05/27/2015   Procedure: LOOP ELECTROSURGICAL EXCISION PROCEDURE (LEEP);  Surgeon: [name], MD;  Location: [facility];  Service: Gynecology;  Laterality: N/A;  . TONSILLECTOMY  age 44    SOCIAL HISTORY: Social History   Tobacco Use  . Smoking status: Former Smoker    Last attempt to quit: 06/14/2000    Years since quitting: 17.4  . Smokeless tobacco: Never Used  Substance Use Topics  . Alcohol use: No    Alcohol/week: 0.0 oz  . Drug use: No    FAMILY HISTORY: Family History  Problem Relation Age of Onset  . Hypertension Mother   . Cancer Mother        lung (nonsmoker)  . Stroke Father   . Hypertension Father   . Diabetes Father   . Hypertension Sister   . Diabetes Sister   . Heart disease Sister   . Hypertension Sister   . Breast cancer Maternal Aunt 70       49 of old age  . Diabetes Paternal Grandmother     ROS: Review of Systems  Constitutional: Positive for malaise/fatigue and weight loss.  Respiratory: Negative for shortness of breath.   Cardiovascular: Negative for chest pain.  Gastrointestinal: Negative for nausea and vomiting.  Musculoskeletal:       Negative for muscle weakness  Endo/Heme/Allergies:       Negative for hypoglycemia    PHYSICAL EXAM: Blood pressure 135/74, pulse (!) 105, temperature 98.1 F (36.7 C), temperature source Oral, height _0  (1.575 m), weight 268 lb (121.6 kg), last menstrual period 06/14/1998, SpO2 98 %. Body mass index is 49.02 kg/m. Physical Exam  Constitutional: She is oriented to person, place, and time. She appears well-developed and well-nourished.  Cardiovascular: Tachycardia present.  Pulmonary/Chest: Effort normal.  Musculoskeletal: Normal range of motion.  Neurological: She is oriented to person, place, and time.  Skin: Skin is warm and dry.  Psychiatric: She has a normal mood and affect. Her behavior is  normal.  Vitals reviewed.   RECENT LABS AND TESTS: BMET    Component Value Date/Time   NA 140 10/03/2017 1224   K 4.6 10/03/2017 1224   CL 104 10/03/2017 1224   CO2 20 10/03/2017 1224   GLUCOSE 107 (H) 10/03/2017 1224   GLUCOSE 139 (H) 05/16/2017 1040   BUN 37 (H) 10/03/2017 1224   CREATININE 1.54 (H) 10/03/2017 1224   CREATININE 1.64 (H) 05/16/2017 1040   CALCIUM 9.2 10/03/2017 1224   GFRNONAA 33 (L) 10/03/2017 1224   GFRAA 38 (L) 10/03/2017 1224   Lab Results  Component  Value Date   HGBA1C 7.0 (H) 10/03/2017   HGBA1C 6.4 05/16/2017   HGBA1C 6.6 07/29/2016   HGBA1C 5.9 03/29/2016   HGBA1C 6.3 09/24/2015   Lab Results  Component Value Date   INSULIN 8.7 10/03/2017   CBC    Component Value Date/Time   WBC 7.0 10/03/2017 1224   WBC 7.4 05/16/2017 1040   RBC 4.42 10/03/2017 1224   RBC 4.51 05/16/2017 1040   HGB 12.5 10/03/2017 1224   HCT 39.4 10/03/2017 1224   PLT 260 05/16/2017 1040   MCV 89 10/03/2017 1224   MCH 28.3 10/03/2017 1224   MCH 27.9 05/16/2017 1040   MCHC 31.7 10/03/2017 1224   MCHC 32.3 05/16/2017 1040   RDW 15.3 10/03/2017 1224   LYMPHSABS 1.7 10/03/2017 1224   MONOABS 558 03/29/2016 1335   EOSABS 0.3 10/03/2017 1224   BASOSABS 0.0 10/03/2017 1224   Iron/TIBC/Ferritin/ %Sat No results found for: IRON, TIBC, FERRITIN, IRONPCTSAT Lipid Panel     Component Value Date/Time   CHOL 163 10/03/2017 1224   TRIG 140 10/03/2017 1224   HDL 42 10/03/2017 1224   CHOLHDL 4.4 05/16/2017 1040   VLDL 24 09/27/2016 1047   LDLCALC 93 10/03/2017 1224   LDLCALC 122 (H) 05/16/2017 1040   Hepatic Function Panel     Component Value Date/Time   PROT 7.1 10/03/2017 1224   ALBUMIN 4.3 10/03/2017 1224   AST 13 10/03/2017 1224   ALT 12 10/03/2017 1224   ALKPHOS 91 10/03/2017 1224   BILITOT 0.3 10/03/2017 1224      Component Value Date/Time   TSH 2.900 10/03/2017 1224   TSH 3.80 05/16/2017 1040   TSH 4.15 09/24/2015 0828   Results for Ceesay, Teana M  "MARIE" (MRN [id number]) as of 11/22/2017 17:38  Ref. Range 10/03/2017 12:24  Vitamin D, 25-Hydroxy Latest Ref Range: 30.0 - 100.0 ng/mL 28.3 (L)   ASSESSMENT AND PLAN: Type 2 diabetes mellitus with other diabetic kidney complication, with long-term current use of insulin (HCC)  Vitamin D deficiency - Plan: Vitamin D, Ergocalciferol, (DRISDOL) 50000 units CAPS capsule  Other fatigue  Class 3 severe obesity with serious comorbidity and body mass index (BMI) of 45.0 to 49.9 in adult, unspecified obesity type ([place])  PLAN:  Vitamin D Deficiency Kerri Richardson was informed that low vitamin D levels contributes to fatigue and are associated with obesity, breast, and colon cancer. She agrees to continue to take prescription Vit D _0 ,000 IU every week #4 with no refills and will follow up for routine testing of vitamin D, at least 2-3 times per year. She was informed of the risk of over-replacement of vitamin D and agrees to not increase her dose unless she discusses this with [name] first. Kerri Richardson agrees to follow up as directed.  Fatigue Kerri Richardson was educated about the risks of sleep apnea , but she declines any referral. Kerri Richardson will follow up with her PCP for further work up of fatigue.  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 We discussed the importance of good blood sugar control to decrease the likelihood of diabetic complications such as nephropathy, neuropathy, limb loss, blindness, coronary artery disease, and death. We discussed the importance of intensive lifestyle modification including diet, exercise and weight loss as the first line treatment for diabetes. Kerri Richardson agrees to continue taking insulin at 20 units  and continue Actos. Kerri Richardson agreed to follow up at the agreed upon time.  Obesity Kerri Richardson is currently  in the action stage of change. As such, her goal is to continue with weight loss efforts She has agreed to follow the Category 2 plan Kerri Richardson has been instructed to work up to a goal of 150 minutes of combined cardio and strengthening exercise per week for weight loss and overall health benefits. We discussed the following Behavioral Modification Strategies today: increasing lean protein intake and work on meal planning and easy cooking plans  Kerri Richardson has agreed to follow up with our clinic in 2 weeks. She was informed of the importance of frequent follow up visits to maximize her success with intensive lifestyle modifications for her multiple health conditions.   OBESITY BEHAVIORAL INTERVENTION VISIT  Today's visit was # 4 out of 22.  Starting weight: 286 lbs Starting date: 10/03/17 Today's weight : 268 lbs  Today's date: 11/22/2017 Total lbs lost to date: 83 (Patients must lose 7 lbs in the first 6 months to continue with counseling)   ASK: We discussed the diagnosis of obesity with Kerri Richardson today and Kerri Richardson agreed to give [name] permission to discuss obesity behavioral modification therapy today.  ASSESS: Kerri Richardson has the diagnosis of obesity and her BMI today is 49.01 Kerri Richardson is in the action stage of change   ADVISE: Kerri Richardson was educated on the multiple health risks of obesity as well as the benefit of weight loss to improve her health. She was advised of the need for long term treatment and the importance of lifestyle modifications.  AGREE: Multiple dietary modification options and treatment options were discussed and  Kerri Richardson agreed to the above obesity treatment plan.   Corey Skains, am acting as transcriptionist for [employer], PA-C I, Lacy Duverney [facility], have reviewed this note and agree with its content

## 2017-11-22 NOTE — Progress Notes (Signed)
Chief Complaint  Patient presents with  . med check    Diabetes med check , sugar running good, last A1C 10-03-17      She has been seeing Dr. Adair Patter for weight loss.  She has lost 11# on the program so far.  She still has not yet started regular exercise.  She said that she hadn't told her to start exercising yet. She plans to watch her great granddaughter when her granddaughter goes back to work (hasn't had baby yet). Her right knee continues to bothers her (which has limited her walking in the past).  Diabetes follow-up:Metformin was stopped in February 2018 related to elevated creatinine. Lantus dose was titrated up (was on 27 Units at our last visit, but was recently decreased to 24 U by Dr. Adair Patter), and further decreased it to 20 units about 1-2 weeks ago. Wasn't having any hypoglycemic symptoms.  She brings in list today (but doesn't have marked when the dose was changed).   Morning sugars over the last week have been 93-116, and evening 116-138. Last eye exam we have on file was 07/2016, no retinopathy. She reports she goes every 6 months. Patient follows a low sugar diet and checks feet regularly without concerns. She cut out the carbs and the sweets in her diet. Lab Results  Component Value Date   HGBA1C 7.0 (H) 10/03/2017   Hyperlipidemia follow-up: Patient is reportedly following a low-fat, low cholesterol diet. Compliant with medications and denies medication side effects. Lab Results  Component Value Date   CHOL 163 10/03/2017   HDL 42 10/03/2017   LDLCALC 93 10/03/2017   TRIG 140 10/03/2017   CHOLHDL 4.4 05/16/2017   Hypertension follow-up: Blood pressure is not checked elsewhere, except at other doctor visits, and has been fine. Denies dizziness, headaches, chest pain. Denies side effects of medications.  Vitamin D deficiency--mildly low in April. Patient hadn't started oral supplement as recommended by Dr. Adair Patter, so was recently put on 4 weeks of rx  treatment.  H/o toe pain--goutand degenerative changes. On allopurinol for prevention, with colchicine for prn use. Only on 165m of allopurinol (dose not increased due to impaired Cr and lack of gout symptoms on this dose). Denies any pain or swelling. Lab Results  Component Value Date   LABURIC 6.5 05/16/2017    CKD--sees Dr. PFlorene Glenyearly, due next month,.Denies any edema, on lasix 255mdaily. Denies edema.  PMH, PSH, SH reviewed  Outpatient Encounter Medications as of 11/23/2017  Medication Sig Note  . ACCU-CHEK FASTCLIX LANCETS MISC TEST TWO TIMES DAILY   . ACCU-CHEK SMARTVIEW test strip TEST TWICE DAILY   . allopurinol (ZYLOPRIM) 100 MG tablet TAKE 1 TABLET EVERY DAY   . amLODipine (NORVASC) 10 MG tablet Take 1 tablet (10 mg total) by mouth daily.   . Marland Kitchenspirin 81 MG tablet Take 81 mg by mouth daily.     . Marland Kitchentorvastatin (LIPITOR) 40 MG tablet TAKE 1 TABLET EVERY DAY   . BD INSULIN SYRINGE ULTRAFINE 31G X 5/16" 0.5 ML MISC USE EVERY DAY   . Blood Glucose Monitoring Suppl (ACCU-CHEK NANO SMARTVIEW) w/Device KIT 1 each by Does not apply route 2 (two) times daily. BID   . enalapril (VASOTEC) 10 MG tablet TAKE 1 TABLET EVERY DAY   . furosemide (LASIX) 20 MG tablet Take 20 mg by mouth daily.  04/15/2016: Taking 206maily  . insulin glargine (LANTUS) 100 UNIT/ML injection INJECT 20 UNITS EVERY NIGHT AT BEDTIME (DISCARD OPEN VIAL 28 DAYS AFTER FIRST  OPEN)   . latanoprost (XALATAN) 0.005 % ophthalmic solution Place 1 drop into both eyes at bedtime.     . pioglitazone (ACTOS) 30 MG tablet TAKE 1 TABLET EVERY DAY   . Vitamin D, Ergocalciferol, (DRISDOL) 50000 units CAPS capsule Take 1 capsule (50,000 Units total) by mouth every 7 (seven) days.   . [DISCONTINUED] insulin glargine (LANTUS) 100 UNIT/ML injection INJECT 27 UNITS EVERY NIGHT AT BEDTIME (DISCARD OPEN VIAL 28 DAYS AFTER FIRST OPEN) (Patient taking differently: INJECT 24 UNITS EVERY NIGHT AT BEDTIME (DISCARD OPEN VIAL 28 DAYS AFTER  FIRST OPEN))   . [DISCONTINUED] Vitamin D, Ergocalciferol, (DRISDOL) 50000 units CAPS capsule Take 1 capsule (50,000 Units total) by mouth every 7 (seven) days.    No facility-administered encounter medications on file as of 11/23/2017.    Taking 20 U of Lantus qHS prior to visit.  No Known Allergies  ROS: no fever, chills, URI symptoms, headaches, dizziness, numbness, tingling, tremor, syncope, chest pain, shortness of breath, palpitations, GI or GU complaints. No bleeding, bruising, rash. No edema. +Right knee pain. No other joint pains. Moods are good.    PHYSICAL EXAM:  BP 120/62   Pulse 64   Resp 18   Ht '5\' 3"'  (1.6 m)   Wt 272 lb 3.2 oz (123.5 kg)   LMP 06/14/1998 (Approximate)   SpO2 97%   BMI 48.22 kg/m   Wt Readings from Last 3 Encounters:  11/23/17 272 lb 3.2 oz (123.5 kg)  11/22/17 268 lb (121.6 kg)  11/01/17 275 lb (124.7 kg)    Wt 285# 6.4oz at her 05/2017 visit with me.  Well developed, pleasant, obese female in no distress HEENT: conjunctiva and sclera are clear, EOMI, OP clear. Neck: no lymphadenopathy or mass, no bruit Heart: regular rate and rhythm Lungs: clear bilaterally Abdomen: obese, soft, nontender, no mass Extremities: no edema, normal pulses Psych: normal mood, affect, hygiene and grooming Neuro: alert and oriented, cranial nerves intact, normal gait. Skin: normal turgor, no rash or lesions   ASSESSMENT/PLAN:  Type 2 diabetes mellitus with other diabetic kidney complication, with long-term current use of insulin (HCC) - controlled, and able to titrate down insulin dose due to cutting back on carbs/sweets in diet, weight loss  Class 3 severe obesity with serious comorbidity and body mass index (BMI) of 45.0 to 49.9 in adult, unspecified obesity type ([place]) - encouraged continued f/u with Dr. Adair Patter. Exerise recommended, 150 min/week  Essential hypertension - controlled  Vitamin D deficiency - complete 4 weeks rx; advised of need to start  daily OTC supplement upon completion.    Gout involving toe of right foot, unspecified cause, unspecified chronicity - continue allopurinol  Mixed hyperlipidemia - discussed April labs; reviewed lowfat, low cholesterol diet. Cont current statin. Recheck 6 mos, or sooner if done per Dr. Adair Patter. goals reviewed  Diabetes mellitus with stage 3 chronic kidney disease ([place]) - diabetes well controlled - Plan: insulin glargine (LANTUS) 100 UNIT/ML injection   Labs done 09/2017 by Dr. Adair Patter.  She will likely do f/u labs (too early to recheck A1c). Lipids were not at goal, but LDL improved compared to prior check.  Will hold off on rechecking or making changes at this time.  Continue lowfat, low cholesterol diet and current meds  Vit D deficiency--agree with short course of rx replacement, reinforced need for longterm daily supplementation to prevent dropping again.   F/u 6 mos AWV/med check as scheduled 05/2018  Call Dr. Zenia Resides office to get last diabetic eye  exam.

## 2017-11-23 ENCOUNTER — Ambulatory Visit: Payer: Medicare PPO | Admitting: Family Medicine

## 2017-11-23 ENCOUNTER — Encounter: Payer: Self-pay | Admitting: Family Medicine

## 2017-11-23 VITALS — BP 120/62 | HR 64 | Resp 18 | Ht 63.0 in | Wt 272.2 lb

## 2017-11-23 DIAGNOSIS — E1122 Type 2 diabetes mellitus with diabetic chronic kidney disease: Secondary | ICD-10-CM

## 2017-11-23 DIAGNOSIS — E559 Vitamin D deficiency, unspecified: Secondary | ICD-10-CM

## 2017-11-23 DIAGNOSIS — E1129 Type 2 diabetes mellitus with other diabetic kidney complication: Secondary | ICD-10-CM | POA: Diagnosis not present

## 2017-11-23 DIAGNOSIS — Z6841 Body Mass Index (BMI) 40.0 and over, adult: Secondary | ICD-10-CM

## 2017-11-23 DIAGNOSIS — E782 Mixed hyperlipidemia: Secondary | ICD-10-CM

## 2017-11-23 DIAGNOSIS — I10 Essential (primary) hypertension: Secondary | ICD-10-CM | POA: Diagnosis not present

## 2017-11-23 DIAGNOSIS — Z794 Long term (current) use of insulin: Secondary | ICD-10-CM

## 2017-11-23 DIAGNOSIS — N183 Chronic kidney disease, stage 3 unspecified: Secondary | ICD-10-CM

## 2017-11-23 DIAGNOSIS — M109 Gout, unspecified: Secondary | ICD-10-CM

## 2017-11-23 MED ORDER — INSULIN GLARGINE 100 UNIT/ML ~~LOC~~ SOLN: SUBCUTANEOUS | 0 refills | Status: DC

## 2017-11-23 NOTE — Patient Instructions (Signed)
Your blood pressure and blood sugars look good. Continue on the 20 Units of the lantus. Document on your sugar log if/when you make dose changes. Continue with weight loss--great job! I suspect that they will want you to start getting some regular exercise, goal is 150 minutes each week of aerobic activity.

## 2017-11-24 NOTE — Progress Notes (Signed)
[facility]

## 2017-11-29 ENCOUNTER — Telehealth: Payer: Self-pay | Admitting: Family Medicine

## 2017-11-29 NOTE — Telephone Encounter (Signed)
[facility] Sending back fir review.

## 2017-12-06 ENCOUNTER — Ambulatory Visit (INDEPENDENT_AMBULATORY_CARE_PROVIDER_SITE_OTHER): Payer: Medicare PPO | Admitting: Physician Assistant

## 2017-12-06 VITALS — BP 115/72 | HR 106 | Temp 98.5°F | Ht 63.0 in | Wt 266.0 lb

## 2017-12-06 DIAGNOSIS — Z6841 Body Mass Index (BMI) 40.0 and over, adult: Secondary | ICD-10-CM | POA: Diagnosis not present

## 2017-12-06 DIAGNOSIS — E1129 Type 2 diabetes mellitus with other diabetic kidney complication: Secondary | ICD-10-CM | POA: Diagnosis not present

## 2017-12-06 DIAGNOSIS — Z794 Long term (current) use of insulin: Secondary | ICD-10-CM | POA: Diagnosis not present

## 2017-12-06 DIAGNOSIS — E559 Vitamin D deficiency, unspecified: Secondary | ICD-10-CM | POA: Diagnosis not present

## 2017-12-06 MED ORDER — VITAMIN D (ERGOCALCIFEROL) 1.25 MG (50000 UNIT) PO CAPS: 50000.0000 [IU] | ORAL_CAPSULE | ORAL | 0 refills | Status: DC

## 2017-12-06 NOTE — Progress Notes (Signed)
Office: [phone]  /  Fax: [phone]   HPI:   Chief Complaint: OBESITY Kerri Richardson is here to discuss her progress with her obesity treatment plan. She is on the Category 2 plan and is following her eating plan approximately 100 % of the time. She states she is exercising 0 minutes 0 times per week. Arlen continues to do well with weight loss. She would like more meal planning ideas. Her weight is 266 lb (120.7 kg) today and has had a weight loss of 2 pounds over a period of 2 weeks since her last visit. She has lost 20 lbs since starting treatment with [name].  Vitamin D deficiency Kerri Richardson has a diagnosis of vitamin D deficiency. She is currently taking vit D and denies nausea, vomiting or muscle weakness.  Diabetes II with diabetic kidney complications, on insulin Kerri Richardson has a diagnosis of diabetes type II. Kerri Richardson states fasting BGs range between 90 and 110's and post prandial BGs range between 120's and 140's. Kerri Richardson denies any hypoglycemic episodes. Last A1c was at 7.0 Kerri Richardson is on Lantus 20 units at night and Actos. She has been working on intensive lifestyle modifications including diet, exercise, and weight loss to help control her blood glucose levels.  ALLERGIES: No Known Allergies  MEDICATIONS: Current Outpatient Medications on File Prior to Visit  Medication Sig Dispense Refill  . ACCU-CHEK FASTCLIX LANCETS MISC TEST TWO TIMES DAILY 204 each 2  . ACCU-CHEK SMARTVIEW test strip TEST TWICE DAILY 200 each 2  . allopurinol (ZYLOPRIM) 100 MG tablet TAKE 1 TABLET EVERY DAY 90 tablet 0  . amLODipine (NORVASC) 10 MG tablet Take 1 tablet (10 mg total) by mouth daily. 90 tablet 1  . aspirin 81 MG tablet Take 81 mg by mouth daily.      Kerri Richardson atorvastatin (LIPITOR) 40 MG tablet TAKE 1 TABLET EVERY DAY 90 tablet 0  . BD INSULIN SYRINGE ULTRAFINE 31G X 5/16" 0.5 ML MISC USE EVERY DAY 90 each 3  . Blood Glucose Monitoring Suppl (ACCU-CHEK NANO SMARTVIEW) w/Device KIT 1 each by Does not apply route 2 (two)  times daily. BID 1 kit 0  . enalapril (VASOTEC) 10 MG tablet TAKE 1 TABLET EVERY DAY 90 tablet 0  . furosemide (LASIX) 20 MG tablet Take 20 mg by mouth daily.     . insulin glargine (LANTUS) 100 UNIT/ML injection INJECT 20 UNITS EVERY NIGHT AT BEDTIME (DISCARD OPEN VIAL 28 DAYS AFTER FIRST OPEN) 30 mL 0  . latanoprost (XALATAN) 0.005 % ophthalmic solution Place 1 drop into both eyes at bedtime.      . pioglitazone (ACTOS) 30 MG tablet TAKE 1 TABLET EVERY DAY 90 tablet 0  . Vitamin D, Ergocalciferol, (DRISDOL) 50000 units CAPS capsule Take 1 capsule (50,000 Units total) by mouth every 7 (seven) days. 4 capsule 0   No current facility-administered medications on file prior to visit.     PAST MEDICAL HISTORY: Past Medical History:  Diagnosis Date  . Arthritis   . CKD (chronic kidney disease) stage 3, GFR 30-59 ml/min (HCC)    Dr. [name]  . Diabetic retinopathy   . DM retinopathy ([place]) 08/17/12   early  . Dyspnea   . Essential hypertension, benign   . Glaucoma   . Gout   . Pure hypercholesterolemia   . Type II or unspecified type diabetes mellitus without mention of complication, not stated as uncontrolled     PAST SURGICAL HISTORY: Past Surgical History:  Procedure Laterality Date  . BRAIN SURGERY  1969   Burr holes in skull to alleviate pressure on the brain (no shunt)  . CATARACT EXTRACTION, BILATERAL  2012   Dr. [name]  . COLONOSCOPY  3/05, 11/2013   due again 2020  . COLPOSCOPY N/A 05/27/2015   Procedure: COLPOSCOPY;  Surgeon: [name], MD;  Location: [facility];  Service: Gynecology;  Laterality: N/A;  . LEEP N/A 05/27/2015   Procedure: LOOP ELECTROSURGICAL EXCISION PROCEDURE (LEEP);  Surgeon: [name], MD;  Location: [facility];  Service: Gynecology;  Laterality: N/A;  . TONSILLECTOMY  age 73    SOCIAL HISTORY: Social History   Tobacco Use  . Smoking status: Former Smoker    Last attempt to quit: 06/14/2000    Years since quitting: 17.4  .  Smokeless tobacco: Never Used  Substance Use Topics  . Alcohol use: No    Alcohol/week: 0.0 oz  . Drug use: No    FAMILY HISTORY: Family History  Problem Relation Age of Onset  . Hypertension Mother   . Cancer Mother        lung (nonsmoker)  . Stroke Father   . Hypertension Father   . Diabetes Father   . Hypertension Sister   . Diabetes Sister   . Heart disease Sister   . Hypertension Sister   . Breast cancer Maternal Aunt 53       41 of old age  . Diabetes Paternal Grandmother     ROS: Review of Systems  Constitutional: Positive for weight loss.  Gastrointestinal: Negative for nausea and vomiting.  Musculoskeletal:       Negative for muscle weakness  Endo/Heme/Allergies:       Negative for hypoglycemia    PHYSICAL EXAM: Blood pressure 115/72, pulse (!) 106, temperature 98.5 F (36.9 C), temperature source Oral, height _0  (1.6 m), weight 266 lb (120.7 kg), last menstrual period 06/14/1998, SpO2 98 %. Body mass index is 47.12 kg/m. Physical Exam  Constitutional: She is oriented to person, place, and time. She appears well-developed and well-nourished.  Cardiovascular: Tachycardia present.  Pulmonary/Chest: Effort normal.  Musculoskeletal: Normal range of motion.  Neurological: She is oriented to person, place, and time.  Skin: Skin is warm and dry.  Psychiatric: She has a normal mood and affect. Her behavior is normal.  Vitals reviewed.   RECENT LABS AND TESTS: BMET    Component Value Date/Time   NA 140 10/03/2017 1224   K 4.6 10/03/2017 1224   CL 104 10/03/2017 1224   CO2 20 10/03/2017 1224   GLUCOSE 107 (H) 10/03/2017 1224   GLUCOSE 139 (H) 05/16/2017 1040   BUN 37 (H) 10/03/2017 1224   CREATININE 1.54 (H) 10/03/2017 1224   CREATININE 1.64 (H) 05/16/2017 1040   CALCIUM 9.2 10/03/2017 1224   GFRNONAA 33 (L) 10/03/2017 1224   GFRAA 38 (L) 10/03/2017 1224   Lab Results  Component Value Date   HGBA1C 7.0 (H) 10/03/2017   HGBA1C 6.4 05/16/2017    HGBA1C 6.6 07/29/2016   HGBA1C 5.9 03/29/2016   HGBA1C 6.3 09/24/2015   Lab Results  Component Value Date   INSULIN 8.7 10/03/2017   CBC    Component Value Date/Time   WBC 7.0 10/03/2017 1224   WBC 7.4 05/16/2017 1040   RBC 4.42 10/03/2017 1224   RBC 4.51 05/16/2017 1040   HGB 12.5 10/03/2017 1224   HCT 39.4 10/03/2017 1224   PLT 260 05/16/2017 1040   MCV 89 10/03/2017 1224   MCH 28.3  10/03/2017 1224   MCH 27.9 05/16/2017 1040   MCHC 31.7 10/03/2017 1224   MCHC 32.3 05/16/2017 1040   RDW 15.3 10/03/2017 1224   LYMPHSABS 1.7 10/03/2017 1224   MONOABS 558 03/29/2016 1335   EOSABS 0.3 10/03/2017 1224   BASOSABS 0.0 10/03/2017 1224   Iron/TIBC/Ferritin/ %Sat No results found for: IRON, TIBC, FERRITIN, IRONPCTSAT Lipid Panel     Component Value Date/Time   CHOL 163 10/03/2017 1224   TRIG 140 10/03/2017 1224   HDL 42 10/03/2017 1224   CHOLHDL 4.4 05/16/2017 1040   VLDL 24 09/27/2016 1047   LDLCALC 93 10/03/2017 1224   LDLCALC 122 (H) 05/16/2017 1040   Hepatic Function Panel     Component Value Date/Time   PROT 7.1 10/03/2017 1224   ALBUMIN 4.3 10/03/2017 1224   AST 13 10/03/2017 1224   ALT 12 10/03/2017 1224   ALKPHOS 91 10/03/2017 1224   BILITOT 0.3 10/03/2017 1224      Component Value Date/Time   TSH 2.900 10/03/2017 1224   TSH 3.80 05/16/2017 1040   TSH 4.15 09/24/2015 0828   Results for Torgeson, Ellizabeth M "MARIE" (MRN [id number]) as of 12/06/2017 17:03  Ref. Range 10/03/2017 12:24  Vitamin D, 25-Hydroxy Latest Ref Range: 30.0 - 100.0 ng/mL 28.3 (L)   ASSESSMENT AND PLAN: Vitamin D deficiency - Plan: Vitamin D, Ergocalciferol, (DRISDOL) 50000 units CAPS capsule  Type 2 diabetes mellitus with other diabetic kidney complication, with long-term current use of insulin (HCC)  Class 3 severe obesity with serious comorbidity and body mass index (BMI) of 45.0 to 49.9 in adult, unspecified obesity type ([place])  PLAN:  Vitamin D Deficiency Dayna was informed that  low vitamin D levels contributes to fatigue and are associated with obesity, breast, and colon cancer. She agrees to continue to take prescription Vit D _0 ,000 IU every week #4 with no refills and will follow up for routine testing of vitamin D, at least 2-3 times per year. She was informed of the risk of over-replacement of vitamin D and agrees to not increase her dose unless she discusses this with [name] first. Evon agrees to follow up as directed.  Diabetes II with diabetic kidney complications, on insulin Benny has been given extensive diabetes education by myself today including ideal fasting and post-prandial blood glucose readings, individual ideal Hgb A1c goals and hypoglycemia prevention. We discussed the importance of good blood sugar control to decrease the likelihood of diabetic complications such as nephropathy, neuropathy, limb loss, blindness, coronary artery disease, and death. We discussed the importance of intensive lifestyle modification including diet, exercise and weight loss as the first line treatment for diabetes. Padme agrees to continue Lantus 20 units at night and Actos. Danyel agrees to  follow up at the agreed upon time.  Obesity Abbigail is currently in the action stage of change. As such, her goal is to continue with weight loss efforts She has agreed to follow the Category 2 plan Derriona has been instructed to work up to a goal of 150 minutes of combined cardio and strengthening exercise per week for weight loss and overall health benefits. We discussed the following Behavioral Modification Strategies today: increasing lean protein intake and work on meal planning and easy cooking plans  Basma has agreed to follow up with our clinic in 2 weeks. She was informed of the importance of frequent follow up visits to maximize her success with intensive lifestyle modifications for her multiple health conditions.   OBESITY BEHAVIORAL INTERVENTION VISIT  Today's visit was # 5 out of  22.  Starting weight: 286 lbs Starting date: 10/03/17 Today's weight : 266 lbs  Today's date: 12/06/2017 Total lbs lost to date: 20 (Patients must lose 7 lbs in the first 6 months to continue with counseling)   ASK: We discussed the diagnosis of obesity with Brita Romp today and [name] agreed to give [name] permission to discuss obesity behavioral modification therapy today.  ASSESS: Etana has the diagnosis of obesity and her BMI today is 47.13 Belky is in the action stage of change   ADVISE: Shunta was educated on the multiple health risks of obesity as well as the benefit of weight loss to improve her health. She was advised of the need for long term treatment and the importance of lifestyle modifications.  AGREE: Multiple dietary modification options and treatment options were discussed and  Bobbyjo agreed to the above obesity treatment plan.   Corey Skains, am acting as transcriptionist for [employer], PA-C I, Lacy Duverney [facility], have reviewed this note and agree with its content

## 2017-12-15 ENCOUNTER — Other Ambulatory Visit: Payer: Self-pay | Admitting: Family Medicine

## 2017-12-15 DIAGNOSIS — E1122 Type 2 diabetes mellitus with diabetic chronic kidney disease: Secondary | ICD-10-CM

## 2017-12-15 DIAGNOSIS — N183 Chronic kidney disease, stage 3 unspecified: Secondary | ICD-10-CM

## 2017-12-15 DIAGNOSIS — M109 Gout, unspecified: Secondary | ICD-10-CM

## 2017-12-27 ENCOUNTER — Ambulatory Visit (INDEPENDENT_AMBULATORY_CARE_PROVIDER_SITE_OTHER): Payer: Medicare PPO | Admitting: Physician Assistant

## 2018-01-04 DIAGNOSIS — I129 Hypertensive chronic kidney disease with stage 1 through stage 4 chronic kidney disease, or unspecified chronic kidney disease: Secondary | ICD-10-CM | POA: Diagnosis not present

## 2018-01-04 DIAGNOSIS — I10 Essential (primary) hypertension: Secondary | ICD-10-CM | POA: Diagnosis not present

## 2018-01-04 DIAGNOSIS — N183 Chronic kidney disease, stage 3 (moderate): Secondary | ICD-10-CM | POA: Diagnosis not present

## 2018-01-05 ENCOUNTER — Ambulatory Visit (INDEPENDENT_AMBULATORY_CARE_PROVIDER_SITE_OTHER): Payer: Medicare PPO | Admitting: Family Medicine

## 2018-01-05 VITALS — BP 127/69 | HR 85 | Temp 98.1°F | Ht 63.0 in | Wt 262.0 lb

## 2018-01-05 DIAGNOSIS — E559 Vitamin D deficiency, unspecified: Secondary | ICD-10-CM | POA: Diagnosis not present

## 2018-01-05 DIAGNOSIS — Z6841 Body Mass Index (BMI) 40.0 and over, adult: Secondary | ICD-10-CM

## 2018-01-05 MED ORDER — VITAMIN D (ERGOCALCIFEROL) 1.25 MG (50000 UNIT) PO CAPS: 50000.0000 [IU] | ORAL_CAPSULE | ORAL | 0 refills | Status: DC

## 2018-01-09 NOTE — Progress Notes (Signed)
Office: [phone]  /  Fax: [phone]   HPI:   Chief Complaint: OBESITY Kerri Richardson is here to discuss her progress with her obesity treatment plan. She is on the Category 2 plan and is following her eating plan approximately 90 % of the time. She states she is doing a little more walking. Kerri Richardson continues to do very well with weight loss on the category 2 plan, but she is getting bored with chicken and would like more ideas. Her weight is 262 lb (118.8 kg) today and has had a weight loss of 4 pounds over a period of 4 weeks since her last visit. She has lost 24 lbs since starting treatment with [name].  Vitamin D deficiency Kerri Richardson has a diagnosis of vitamin D deficiency. Her last level was not yet at goal. She is stable on vit D and denies nausea, vomiting or muscle weakness.  ALLERGIES: No Known Allergies  MEDICATIONS: Current Outpatient Medications on File Prior to Visit  Medication Sig Dispense Refill  . ACCU-CHEK FASTCLIX LANCETS MISC TEST TWO TIMES DAILY 204 each 2  . ACCU-CHEK SMARTVIEW test strip TEST TWICE DAILY 200 each 2  . allopurinol (ZYLOPRIM) 100 MG tablet TAKE 1 TABLET EVERY DAY 90 tablet 0  . amLODipine (NORVASC) 10 MG tablet Take 1 tablet (10 mg total) by mouth daily. 90 tablet 1  . aspirin 81 MG tablet Take 81 mg by mouth daily.      Marland Kitchen atorvastatin (LIPITOR) 40 MG tablet TAKE 1 TABLET EVERY DAY 90 tablet 0  . BD INSULIN SYRINGE ULTRAFINE 31G X 5/16" 0.5 ML MISC USE EVERY DAY 90 each 3  . Blood Glucose Monitoring Suppl (ACCU-CHEK NANO SMARTVIEW) w/Device KIT 1 each by Does not apply route 2 (two) times daily. BID 1 kit 0  . enalapril (VASOTEC) 10 MG tablet TAKE 1 TABLET EVERY DAY 90 tablet 0  . furosemide (LASIX) 20 MG tablet Take 20 mg by mouth daily.     . insulin glargine (LANTUS) 100 UNIT/ML injection INJECT 20 UNITS EVERY NIGHT AT BEDTIME (DISCARD OPEN VIAL 28 DAYS AFTER FIRST OPEN) 30 mL 0  . latanoprost (XALATAN) 0.005 % ophthalmic solution Place 1 drop into both eyes  at bedtime.      . pioglitazone (ACTOS) 30 MG tablet TAKE 1 TABLET EVERY DAY 90 tablet 0   No current facility-administered medications on file prior to visit.     PAST MEDICAL HISTORY: Past Medical History:  Diagnosis Date  . Arthritis   . CKD (chronic kidney disease) stage 3, GFR 30-59 ml/min (HCC)    Dr. [name]  . Diabetic retinopathy   . DM retinopathy ([place]) 08/17/12   early  . Dyspnea   . Essential hypertension, benign   . Glaucoma   . Gout   . Pure hypercholesterolemia   . Type II or unspecified type diabetes mellitus without mention of complication, not stated as uncontrolled     PAST SURGICAL HISTORY: Past Surgical History:  Procedure Laterality Date  . BRAIN SURGERY  1969   Burr holes in skull to alleviate pressure on the brain (no shunt)  . CATARACT EXTRACTION, BILATERAL  2012   Dr. [name]  . COLONOSCOPY  3/05, 11/2013   due again 2020  . COLPOSCOPY N/A 05/27/2015   Procedure: COLPOSCOPY;  Surgeon: [name], MD;  Location: [facility];  Service: Gynecology;  Laterality: N/A;  . LEEP N/A 05/27/2015   Procedure: LOOP ELECTROSURGICAL EXCISION PROCEDURE (LEEP);  Surgeon: Vicenta Dunning  Quincy Simmonds, MD;  Location: [facility];  Service: Gynecology;  Laterality: N/A;  . TONSILLECTOMY  age 17    SOCIAL HISTORY: Social History   Tobacco Use  . Smoking status: Former Smoker    Last attempt to quit: 06/14/2000    Years since quitting: 17.5  . Smokeless tobacco: Never Used  Substance Use Topics  . Alcohol use: No    Alcohol/week: 0.0 oz  . Drug use: No    FAMILY HISTORY: Family History  Problem Relation Age of Onset  . Hypertension Mother   . Cancer Mother        lung (nonsmoker)  . Stroke Father   . Hypertension Father   . Diabetes Father   . Hypertension Sister   . Diabetes Sister   . Heart disease Sister   . Hypertension Sister   . Breast cancer Maternal Aunt 33       27 of old age  . Diabetes Paternal Grandmother     ROS: Review of Systems    Constitutional: Positive for weight loss.  Gastrointestinal: Negative for nausea and vomiting.  Musculoskeletal:       Negative for muscle weakness    PHYSICAL EXAM: Blood pressure 127/69, pulse 85, temperature 98.1 F (36.7 C), temperature source Oral, height '5\' 3"'  (1.6 m), weight 262 lb (118.8 kg), last menstrual period 06/14/1998, SpO2 98 %. Body mass index is 46.41 kg/m. Physical Exam  Constitutional: She is oriented to person, place, and time. She appears well-developed and well-nourished.  Cardiovascular: Normal rate.  Pulmonary/Chest: Effort normal.  Musculoskeletal: Normal range of motion.  Neurological: She is oriented to person, place, and time.  Skin: Skin is warm and dry.  Psychiatric: She has a normal mood and affect. Her behavior is normal.  Vitals reviewed.   RECENT LABS AND TESTS: BMET    Component Value Date/Time   NA 140 10/03/2017 1224   K 4.6 10/03/2017 1224   CL 104 10/03/2017 1224   CO2 20 10/03/2017 1224   GLUCOSE 107 (H) 10/03/2017 1224   GLUCOSE 139 (H) 05/16/2017 1040   BUN 37 (H) 10/03/2017 1224   CREATININE 1.54 (H) 10/03/2017 1224   CREATININE 1.64 (H) 05/16/2017 1040   CALCIUM 9.2 10/03/2017 1224   GFRNONAA 33 (L) 10/03/2017 1224   GFRAA 38 (L) 10/03/2017 1224   Lab Results  Component Value Date   HGBA1C 7.0 (H) 10/03/2017   HGBA1C 6.4 05/16/2017   HGBA1C 6.6 07/29/2016   HGBA1C 5.9 03/29/2016   HGBA1C 6.3 09/24/2015   Lab Results  Component Value Date   INSULIN 8.7 10/03/2017   CBC    Component Value Date/Time   WBC 7.0 10/03/2017 1224   WBC 7.4 05/16/2017 1040   RBC 4.42 10/03/2017 1224   RBC 4.51 05/16/2017 1040   HGB 12.5 10/03/2017 1224   HCT 39.4 10/03/2017 1224   PLT 260 05/16/2017 1040   MCV 89 10/03/2017 1224   MCH 28.3 10/03/2017 1224   MCH 27.9 05/16/2017 1040   MCHC 31.7 10/03/2017 1224   MCHC 32.3 05/16/2017 1040   RDW 15.3 10/03/2017 1224   LYMPHSABS 1.7 10/03/2017 1224   MONOABS 558 03/29/2016 1335    EOSABS 0.3 10/03/2017 1224   BASOSABS 0.0 10/03/2017 1224   Iron/TIBC/Ferritin/ %Sat No results found for: IRON, TIBC, FERRITIN, IRONPCTSAT Lipid Panel     Component Value Date/Time   CHOL 163 10/03/2017 1224   TRIG 140 10/03/2017 1224   HDL 42 10/03/2017 1224   CHOLHDL 4.4 05/16/2017  1040   VLDL 24 09/27/2016 1047   LDLCALC 93 10/03/2017 1224   LDLCALC 122 (H) 05/16/2017 1040   Hepatic Function Panel     Component Value Date/Time   PROT 7.1 10/03/2017 1224   ALBUMIN 4.3 10/03/2017 1224   AST 13 10/03/2017 1224   ALT 12 10/03/2017 1224   ALKPHOS 91 10/03/2017 1224   BILITOT 0.3 10/03/2017 1224      Component Value Date/Time   TSH 2.900 10/03/2017 1224   TSH 3.80 05/16/2017 1040   TSH 4.15 09/24/2015 0828   Results for Mcclurg, Kerri M "MARIE" (MRN [id number]) as of 01/09/2018 11:12  Ref. Range 10/03/2017 12:24  Vitamin D, 25-Hydroxy Latest Ref Range: 30.0 - 100.0 ng/mL 28.3 (L)   ASSESSMENT AND PLAN: Vitamin D deficiency - Plan: Vitamin D, Ergocalciferol, (DRISDOL) 50000 units CAPS capsule  Class 3 severe obesity with serious comorbidity and body mass index (BMI) of 45.0 to 49.9 in adult, unspecified obesity type (Kerri Richardson)  PLAN:  Vitamin D Deficiency Kerri Richardson was informed that low vitamin D levels contributes to fatigue and are associated with obesity, breast, and colon cancer. She agrees to continue to take prescription Vit D '@50' ,000 IU every week #4 with no refills and will follow up for routine testing of vitamin D, at least 2-3 times per year. She was informed of the risk of over-replacement of vitamin D and agrees to not increase her dose unless she discusses this with [name] first. We will recheck labs at the next visit and Kerri Richardson agrees to follow up as directed.  Obesity Kerri Richardson is currently in the action stage of change. As such, her goal is to continue with weight loss efforts She has agreed to follow the Category 2 plan Kerri Richardson has been instructed to work up to a goal of 150  minutes of combined cardio and strengthening exercise per week for weight loss and overall health benefits. We discussed the following Behavioral Modification Strategies today: increase H2O intake, increasing lean protein intake, decreasing simple carbohydrates  and work on meal planning and easy cooking plans  Kerri Richardson was given cheesy greek chicken recipe today to help give her ideas.  Kerri Richardson has agreed to follow up with our clinic in 3 to 4 weeks. We will check labs at the next visit. She was informed of the importance of frequent follow up visits to maximize her success with intensive lifestyle modifications for her multiple health conditions.   OBESITY BEHAVIORAL INTERVENTION VISIT  Today's visit was # 6 out of 22.  Starting weight: 286 lbs Starting date: 10/03/17 Today's weight : 262 lbs Today's date: 01/05/2018 Total lbs lost to date: 24    ASK: We discussed the diagnosis of obesity with Brita Romp today and [name] agreed to give [name] permission to discuss obesity behavioral modification therapy today.  ASSESS: Natosha has the diagnosis of obesity and her BMI today is 25.42 Anajulia is in the action stage of change   ADVISE: Jilda was educated on the multiple health risks of obesity as well as the benefit of weight loss to improve her health. She was advised of the need for long term treatment and the importance of lifestyle modifications.  AGREE: Multiple dietary modification options and treatment options were discussed and  Yanett agreed to the above obesity treatment plan.  I, Doreene Nest, am acting as transcriptionist for Dennard Nip, MD  I have reviewed the above documentation for accuracy and completeness, and I agree with the above. -Dennard Nip, MD

## 2018-01-16 ENCOUNTER — Other Ambulatory Visit: Payer: Self-pay | Admitting: Family Medicine

## 2018-01-16 DIAGNOSIS — N183 Chronic kidney disease, stage 3 (moderate): Principal | ICD-10-CM

## 2018-01-16 DIAGNOSIS — E1122 Type 2 diabetes mellitus with diabetic chronic kidney disease: Secondary | ICD-10-CM

## 2018-01-16 DIAGNOSIS — I10 Essential (primary) hypertension: Secondary | ICD-10-CM

## 2018-01-26 ENCOUNTER — Ambulatory Visit (INDEPENDENT_AMBULATORY_CARE_PROVIDER_SITE_OTHER): Payer: Medicare PPO | Admitting: Family Medicine

## 2018-01-26 VITALS — BP 125/74 | HR 78 | Temp 98.0°F | Ht 63.0 in | Wt 257.0 lb

## 2018-01-26 DIAGNOSIS — E1121 Type 2 diabetes mellitus with diabetic nephropathy: Secondary | ICD-10-CM

## 2018-01-26 DIAGNOSIS — Z794 Long term (current) use of insulin: Secondary | ICD-10-CM | POA: Diagnosis not present

## 2018-01-26 DIAGNOSIS — E559 Vitamin D deficiency, unspecified: Secondary | ICD-10-CM

## 2018-01-26 DIAGNOSIS — Z6841 Body Mass Index (BMI) 40.0 and over, adult: Secondary | ICD-10-CM | POA: Diagnosis not present

## 2018-01-26 DIAGNOSIS — E66813 Obesity, class 3: Secondary | ICD-10-CM

## 2018-01-26 NOTE — Progress Notes (Signed)
Office: [phone]  /  Fax: [phone]   HPI:   Chief Complaint: OBESITY Kerri Richardson is here to discuss her progress with her obesity treatment plan. She is on the Category 2 plan and is following her eating plan approximately 90 % of the time. She states she is walking for 15 minutes 2 times per week. Kerri Richardson continues to do well with weight loss on Category 2 plan. She states hunger is controlled and she has started walking outside more.  Her weight is 257 lb (116.6 kg) today and has had a weight loss of 5 pounds over a period of 3 weeks since her last visit. She has lost 29 lbs since starting treatment with [name].  Diabetes II with Nephropathy Kerri Richardson has a diagnosis of diabetes type II. Last A1c was 7.0, Kerri Richardson states fasting BGs range between 85 and 164. She is now on 20 units of Lantus, decreased from 27 units. She denies any hypoglycemic episodes. She has been working on intensive lifestyle modifications including diet, exercise, and weight loss to help control her blood glucose levels.  Vitamin D Deficiency Kerri Richardson has a diagnosis of vitamin D deficiency. She is on prescription Vit D, due for labs. Level not yet at goal. She denies nausea, vomiting or muscle weakness.  ALLERGIES: No Known Allergies  MEDICATIONS: Current Outpatient Medications on File Prior to Visit  Medication Sig Dispense Refill  . ACCU-CHEK FASTCLIX LANCETS MISC TEST TWO TIMES DAILY 204 each 2  . ACCU-CHEK SMARTVIEW test strip TEST TWICE DAILY 200 each 2  . allopurinol (ZYLOPRIM) 100 MG tablet TAKE 1 TABLET EVERY DAY 90 tablet 0  . amLODipine (NORVASC) 10 MG tablet Take 1 tablet (10 mg total) by mouth daily. 90 tablet 1  . aspirin 81 MG tablet Take 81 mg by mouth daily.      Marland Kitchen atorvastatin (LIPITOR) 40 MG tablet TAKE 1 TABLET EVERY DAY 90 tablet 0  . BD INSULIN SYRINGE ULTRAFINE 31G X 5/16" 0.5 ML MISC USE EVERY DAY 90 each 3  . Blood Glucose Monitoring Suppl (ACCU-CHEK NANO SMARTVIEW) w/Device KIT 1 each by Does not apply  route 2 (two) times daily. BID 1 kit 0  . enalapril (VASOTEC) 10 MG tablet TAKE 1 TABLET EVERY DAY 90 tablet 0  . furosemide (LASIX) 20 MG tablet Take 20 mg by mouth daily.     . insulin glargine (LANTUS) 100 UNIT/ML injection INJECT 20 UNITS EVERY NIGHT AT BEDTIME (DISCARD OPEN VIAL 28 DAYS AFTER FIRST OPEN) 30 mL 0  . latanoprost (XALATAN) 0.005 % ophthalmic solution Place 1 drop into both eyes at bedtime.      . pioglitazone (ACTOS) 30 MG tablet TAKE 1 TABLET EVERY DAY 90 tablet 0  . Vitamin D, Ergocalciferol, (DRISDOL) 50000 units CAPS capsule Take 1 capsule (50,000 Units total) by mouth every 7 (seven) days. 4 capsule 0   No current facility-administered medications on file prior to visit.     PAST MEDICAL HISTORY: Past Medical History:  Diagnosis Date  . Arthritis   . CKD (chronic kidney disease) stage 3, GFR 30-59 ml/min (HCC)    Dr. [name]  . Diabetic retinopathy   . DM retinopathy ([place]) 08/17/12   early  . Dyspnea   . Essential hypertension, benign   . Glaucoma   . Gout   . Pure hypercholesterolemia   . Type II or unspecified type diabetes mellitus without mention of complication, not stated as uncontrolled     PAST SURGICAL HISTORY: Past Surgical History:  Procedure Laterality Date  . BRAIN SURGERY  1969   Burr holes in skull to alleviate pressure on the brain (no shunt)  . CATARACT EXTRACTION, BILATERAL  2012   Dr. [name]  . COLONOSCOPY  3/05, 11/2013   due again 2020  . COLPOSCOPY N/A 05/27/2015   Procedure: COLPOSCOPY;  Surgeon: [name], MD;  Location: [facility];  Service: Gynecology;  Laterality: N/A;  . LEEP N/A 05/27/2015   Procedure: LOOP ELECTROSURGICAL EXCISION PROCEDURE (LEEP);  Surgeon: [name], MD;  Location: [facility];  Service: Gynecology;  Laterality: N/A;  . TONSILLECTOMY  age 23    SOCIAL HISTORY: Social History   Tobacco Use  . Smoking status: Former Smoker    Last attempt to quit: 06/14/2000    Years since quitting:  17.6  . Smokeless tobacco: Never Used  Substance Use Topics  . Alcohol use: No    Alcohol/week: 0.0 standard drinks  . Drug use: No    FAMILY HISTORY: Family History  Problem Relation Age of Onset  . Hypertension Mother   . Cancer Mother        lung (nonsmoker)  . Stroke Father   . Hypertension Father   . Diabetes Father   . Hypertension Sister   . Diabetes Sister   . Heart disease Sister   . Hypertension Sister   . Breast cancer Maternal Aunt 33       71 of old age  . Diabetes Paternal Grandmother     ROS: Review of Systems  Constitutional: Positive for weight loss.  Gastrointestinal: Negative for nausea and vomiting.  Musculoskeletal:       Negative muscle weakness  Endo/Heme/Allergies:       Negative hypoglycemia    PHYSICAL EXAM: Blood pressure 125/74, pulse 78, temperature 98 F (36.7 C), temperature source Oral, height '5\' 3"'  (1.6 m), weight 257 lb (116.6 kg), last menstrual period 06/14/1998, SpO2 97 %. Body mass index is 45.53 kg/m. Physical Exam  Constitutional: She is oriented to person, place, and time. She appears well-developed and well-nourished.  Cardiovascular: Normal rate.  Pulmonary/Chest: Effort normal.  Musculoskeletal: Normal range of motion.  Neurological: She is oriented to person, place, and time.  Skin: Skin is warm and dry.  Psychiatric: She has a normal mood and affect. Her behavior is normal.  Vitals reviewed.   RECENT LABS AND TESTS: BMET    Component Value Date/Time   NA 140 10/03/2017 1224   K 4.6 10/03/2017 1224   CL 104 10/03/2017 1224   CO2 20 10/03/2017 1224   GLUCOSE 107 (H) 10/03/2017 1224   GLUCOSE 139 (H) 05/16/2017 1040   BUN 37 (H) 10/03/2017 1224   CREATININE 1.54 (H) 10/03/2017 1224   CREATININE 1.64 (H) 05/16/2017 1040   CALCIUM 9.2 10/03/2017 1224   GFRNONAA 33 (L) 10/03/2017 1224   GFRAA 38 (L) 10/03/2017 1224   Lab Results  Component Value Date   HGBA1C 7.0 (H) 10/03/2017   HGBA1C 6.4 05/16/2017    HGBA1C 6.6 07/29/2016   HGBA1C 5.9 03/29/2016   HGBA1C 6.3 09/24/2015   Lab Results  Component Value Date   INSULIN 8.7 10/03/2017   CBC    Component Value Date/Time   WBC 7.0 10/03/2017 1224   WBC 7.4 05/16/2017 1040   RBC 4.42 10/03/2017 1224   RBC 4.51 05/16/2017 1040   HGB 12.5 10/03/2017 1224   HCT 39.4 10/03/2017 1224   PLT 260 05/16/2017 1040   MCV 89  10/03/2017 1224   MCH 28.3 10/03/2017 1224   MCH 27.9 05/16/2017 1040   MCHC 31.7 10/03/2017 1224   MCHC 32.3 05/16/2017 1040   RDW 15.3 10/03/2017 1224   LYMPHSABS 1.7 10/03/2017 1224   MONOABS 558 03/29/2016 1335   EOSABS 0.3 10/03/2017 1224   BASOSABS 0.0 10/03/2017 1224   Iron/TIBC/Ferritin/ %Sat No results found for: IRON, TIBC, FERRITIN, IRONPCTSAT Lipid Panel     Component Value Date/Time   CHOL 163 10/03/2017 1224   TRIG 140 10/03/2017 1224   HDL 42 10/03/2017 1224   CHOLHDL 4.4 05/16/2017 1040   VLDL 24 09/27/2016 1047   LDLCALC 93 10/03/2017 1224   LDLCALC 122 (H) 05/16/2017 1040   Hepatic Function Panel     Component Value Date/Time   PROT 7.1 10/03/2017 1224   ALBUMIN 4.3 10/03/2017 1224   AST 13 10/03/2017 1224   ALT 12 10/03/2017 1224   ALKPHOS 91 10/03/2017 1224   BILITOT 0.3 10/03/2017 1224      Component Value Date/Time   TSH 2.900 10/03/2017 1224   TSH 3.80 05/16/2017 1040   TSH 4.15 09/24/2015 0828  Results for Starzyk, Kerri M "MARIE" (MRN [id number]) as of 01/26/2018 11:35  Ref. Range 10/03/2017 12:24  Vitamin D, 25-Hydroxy Latest Ref Range: 30.0 - 100.0 ng/mL 28.3 (L)    ASSESSMENT AND PLAN: Type 2 diabetes mellitus with diabetic nephropathy, with long-term current use of insulin (HCC) - Plan: Microalbumin / creatinine urine ratio, Hemoglobin A1c, Comprehensive metabolic panel  Vitamin D deficiency - Plan: VITAMIN D 25 Hydroxy (Vit-D Deficiency, Fractures)  Class 3 severe obesity with serious comorbidity and body mass index (BMI) of 45.0 to 49.9 in adult, unspecified obesity  type ([place])  PLAN:  Diabetes II with Nephropathy Kerri Richardson has been given extensive diabetes education by myself today including ideal fasting and post-prandial blood glucose readings, individual ideal Hgb A1c goals and hypoglycemia prevention. We discussed the importance of good blood sugar control to decrease the likelihood of diabetic complications such as nephropathy, neuropathy, limb loss, blindness, coronary artery disease, and death. We discussed the importance of intensive lifestyle modification including diet, exercise and weight loss as the first line treatment for diabetes. Kerri Richardson agrees to continue her diabetes medications, diet, and exercise. We will check labs and Kerri Richardson agrees to follow up with our clinic in 2 to 3 weeks.  Vitamin D Deficiency Kerri Richardson was informed that low vitamin D levels contributes to fatigue and are associated with obesity, breast, and colon cancer. Kerri Richardson agrees to continue taking prescription Vit D '@50' ,000 IU every week and will follow up for routine testing of vitamin D, at least 2-3 times per year. She was informed of the risk of over-replacement of vitamin D and agrees to not increase her dose unless she discusses this with [name] first. We will check labs and Kerri Richardson agrees to follow up with our clinic in 2 to 3 weeks.  Obesity Kerri Richardson is currently in the action stage of change. As such, her goal is to continue with weight loss efforts She has agreed to follow the Category 2 plan Kerri Richardson has been instructed to work up to a goal of 150 minutes of combined cardio and strengthening exercise per week for weight loss and overall health benefits. We discussed the following Behavioral Modification Strategies today: dealing with family or coworker sabotage   Kerri Richardson has agreed to follow up with our clinic in 2 to 3 weeks. She was informed of the importance of frequent follow up visits to  maximize her success with intensive lifestyle modifications for her multiple health conditions.   OBESITY  BEHAVIORAL INTERVENTION VISIT  Today's visit was # 7 out of 22.  Starting weight: 286 lbs Starting date: 10/03/17 Today's weight : 257 lbs  Today's date: 01/26/2018 Total lbs lost to date: 65    ASK: We discussed the diagnosis of obesity with Kerri Richardson today and Kerri Richardson agreed to give [name] permission to discuss obesity behavioral modification therapy today.  ASSESS: Kerri Richardson has the diagnosis of obesity and her BMI today is 45.54 Kerri Richardson is in the action stage of change   ADVISE: Kerri Richardson was educated on the multiple health risks of obesity as well as the benefit of weight loss to improve her health. She was advised of the need for long term treatment and the importance of lifestyle modifications.  AGREE: Multiple dietary modification options and treatment options were discussed and  Kerri Richardson agreed to the above obesity treatment plan.  I, Trixie Dredge, am acting as transcriptionist for Dennard Nip, MD  I have reviewed the above documentation for accuracy and completeness, and I agree with the above. -Dennard Nip, MD

## 2018-01-27 LAB — COMPREHENSIVE METABOLIC PANEL
ALT: 10 IU/L (ref 0–32)
AST: 16 IU/L (ref 0–40)
Albumin/Globulin Ratio: 1.4 (ref 1.2–2.2)
Albumin: 3.9 g/dL (ref 3.5–4.8)
Alkaline Phosphatase: 79 IU/L (ref 39–117)
BUN/Creatinine Ratio: 25 (ref 12–28)
BUN: 41 mg/dL — ABNORMAL HIGH (ref 8–27)
Bilirubin Total: 0.3 mg/dL (ref 0.0–1.2)
CALCIUM: 9.1 mg/dL (ref 8.7–10.3)
CO2: 18 mmol/L — AB (ref 20–29)
Chloride: 107 mmol/L — ABNORMAL HIGH (ref 96–106)
Creatinine, Ser: 1.64 mg/dL — ABNORMAL HIGH (ref 0.57–1.00)
GFR calc Af Amer: 35 mL/min/{1.73_m2} — ABNORMAL LOW (ref >59)
GFR calc non Af Amer: 31 mL/min/{1.73_m2} — ABNORMAL LOW (ref >59)
GLOBULIN, TOTAL: 2.7 g/dL (ref 1.5–4.5)
Glucose: 95 mg/dL (ref 65–99)
Potassium: 4.5 mmol/L (ref 3.5–5.2)
SODIUM: 136 mmol/L (ref 134–144)
Total Protein: 6.6 g/dL (ref 6.0–8.5)

## 2018-01-27 LAB — MICROALBUMIN / CREATININE URINE RATIO
CREATININE, UR: 216.5 mg/dL
Microalb/Creat Ratio: 3.9 mg/g creat (ref 0.0–30.0)
Microalbumin, Urine: 8.5 ug/mL

## 2018-01-27 LAB — HEMOGLOBIN A1C
Est. average glucose Bld gHb Est-mCnc: 128 mg/dL
HEMOGLOBIN A1C: 6.1 % — AB (ref 4.8–5.6)

## 2018-01-27 LAB — VITAMIN D 25 HYDROXY (VIT D DEFICIENCY, FRACTURES): Vit D, 25-Hydroxy: 27.4 ng/mL — ABNORMAL LOW (ref 30.0–100.0)

## 2018-01-31 DIAGNOSIS — Z961 Presence of intraocular lens: Secondary | ICD-10-CM | POA: Diagnosis not present

## 2018-01-31 DIAGNOSIS — H04123 Dry eye syndrome of bilateral lacrimal glands: Secondary | ICD-10-CM | POA: Diagnosis not present

## 2018-01-31 DIAGNOSIS — E119 Type 2 diabetes mellitus without complications: Secondary | ICD-10-CM | POA: Diagnosis not present

## 2018-01-31 DIAGNOSIS — H401131 Primary open-angle glaucoma, bilateral, mild stage: Secondary | ICD-10-CM | POA: Diagnosis not present

## 2018-01-31 LAB — HM DIABETES EYE EXAM

## 2018-02-02 ENCOUNTER — Other Ambulatory Visit: Payer: Self-pay | Admitting: *Deleted

## 2018-02-02 MED ORDER — ACCU-CHEK NANO SMARTVIEW W/DEVICE KIT: 1.0000 | PACK | Freq: Two times a day (BID) | 0 refills | Status: AC

## 2018-02-03 ENCOUNTER — Encounter: Payer: Self-pay | Admitting: Family Medicine

## 2018-02-16 ENCOUNTER — Ambulatory Visit (INDEPENDENT_AMBULATORY_CARE_PROVIDER_SITE_OTHER): Payer: Medicare PPO | Admitting: Family Medicine

## 2018-02-16 VITALS — BP 120/70 | HR 81 | Temp 97.8°F | Ht 63.0 in | Wt 255.0 lb

## 2018-02-16 DIAGNOSIS — E1121 Type 2 diabetes mellitus with diabetic nephropathy: Secondary | ICD-10-CM

## 2018-02-16 DIAGNOSIS — E559 Vitamin D deficiency, unspecified: Secondary | ICD-10-CM | POA: Diagnosis not present

## 2018-02-16 DIAGNOSIS — Z6841 Body Mass Index (BMI) 40.0 and over, adult: Secondary | ICD-10-CM

## 2018-02-16 DIAGNOSIS — Z794 Long term (current) use of insulin: Secondary | ICD-10-CM

## 2018-02-16 MED ORDER — VITAMIN D (ERGOCALCIFEROL) 1.25 MG (50000 UNIT) PO CAPS: 50000.0000 [IU] | ORAL_CAPSULE | ORAL | 0 refills | Status: DC

## 2018-02-17 NOTE — Progress Notes (Signed)
Office: [phone]  /  Fax: [phone]   HPI:   Chief Complaint: OBESITY Kerri Richardson is here to discuss her progress with her obesity treatment plan. She is on the  follow the Category 2 plan and is following her eating plan approximately 90 % of the time. She states she is exercising 0 minutes 0 times per week. Kerri Richardson is continues to do well on Cat 2 eating plan. She sometimes misses her lunch due to eat breakfast late. She states her hunger is mostly controlled. Her weight is 255 lb (115.7 kg) today and has had a weight loss of 2 pounds over a period of 3 weeks since her last visit. She has lost 31 lbs since starting treatment with [name].  Vitamin D deficiency Kerri Richardson has a diagnosis of vitamin D deficiency. She is currently taking vit D but still not at goal. She denies nausea, vomiting or muscle weakness but admits to fatigue.   Ref. Range 01/26/2018 11:43  Vitamin D, 25-Hydroxy Latest Ref Range: 30.0 - 100.0 ng/mL 27.4 (L)   Diabetes II Kerri Richardson has a diagnosis of diabetes type II. Amberrose states she does not take her BS at home due to a broken meter but her fasting glucose was 95 on recent labs.  She denies any hypoglycemic episodes. Last A1c was 6.1.  She has been working on intensive lifestyle modifications including diet, exercise, and weight loss to help control her blood glucose levels.  ALLERGIES: No Known Allergies  MEDICATIONS: Current Outpatient Medications on File Prior to Visit  Medication Sig Dispense Refill  . ACCU-CHEK FASTCLIX LANCETS MISC TEST TWO TIMES DAILY 204 each 2  . ACCU-CHEK SMARTVIEW test strip TEST TWICE DAILY 200 each 2  . allopurinol (ZYLOPRIM) 100 MG tablet TAKE 1 TABLET EVERY DAY 90 tablet 0  . amLODipine (NORVASC) 10 MG tablet Take 1 tablet (10 mg total) by mouth daily. 90 tablet 1  . aspirin 81 MG tablet Take 81 mg by mouth daily.      Marland Kitchen atorvastatin (LIPITOR) 40 MG tablet TAKE 1 TABLET EVERY DAY 90 tablet 0  . BD INSULIN SYRINGE ULTRAFINE 31G X 5/16" 0.5 ML MISC  USE EVERY DAY 90 each 3  . Blood Glucose Monitoring Suppl (ACCU-CHEK NANO SMARTVIEW) w/Device KIT 1 each by Does not apply route 2 (two) times daily. BID 1 kit 0  . enalapril (VASOTEC) 10 MG tablet TAKE 1 TABLET EVERY DAY 90 tablet 0  . furosemide (LASIX) 20 MG tablet Take 20 mg by mouth daily.     . insulin glargine (LANTUS) 100 UNIT/ML injection INJECT 20 UNITS EVERY NIGHT AT BEDTIME (DISCARD OPEN VIAL 28 DAYS AFTER FIRST OPEN) 30 mL 0  . latanoprost (XALATAN) 0.005 % ophthalmic solution Place 1 drop into both eyes at bedtime.      . pioglitazone (ACTOS) 30 MG tablet TAKE 1 TABLET EVERY DAY 90 tablet 0   No current facility-administered medications on file prior to visit.     PAST MEDICAL HISTORY: Past Medical History:  Diagnosis Date  . Arthritis   . CKD (chronic kidney disease) stage 3, GFR 30-59 ml/min (HCC)    Dr. [name]  . Diabetic retinopathy   . DM retinopathy ([place]) 08/17/12   early  . Dyspnea   . Essential hypertension, benign   . Glaucoma   . Gout   . Pure hypercholesterolemia   . Type II or unspecified type diabetes mellitus without mention of complication, not stated as uncontrolled     PAST SURGICAL HISTORY:  Past Surgical History:  Procedure Laterality Date  . BRAIN SURGERY  1969   Burr holes in skull to alleviate pressure on the brain (no shunt)  . CATARACT EXTRACTION, BILATERAL  2012   Dr. [name]  . COLONOSCOPY  3/05, 11/2013   due again 2020  . COLPOSCOPY N/A 05/27/2015   Procedure: COLPOSCOPY;  Surgeon: [name], MD;  Location: [facility];  Service: Gynecology;  Laterality: N/A;  . LEEP N/A 05/27/2015   Procedure: LOOP ELECTROSURGICAL EXCISION PROCEDURE (LEEP);  Surgeon: [name], MD;  Location: [facility];  Service: Gynecology;  Laterality: N/A;  . TONSILLECTOMY  age 43    SOCIAL HISTORY: Social History   Tobacco Use  . Smoking status: Former Smoker    Last attempt to quit: 06/14/2000    Years since quitting: 17.6  . Smokeless  tobacco: Never Used  Substance Use Topics  . Alcohol use: No    Alcohol/week: 0.0 standard drinks  . Drug use: No    FAMILY HISTORY: Family History  Problem Relation Age of Onset  . Hypertension Mother   . Cancer Mother        lung (nonsmoker)  . Stroke Father   . Hypertension Father   . Diabetes Father   . Hypertension Sister   . Diabetes Sister   . Heart disease Sister   . Hypertension Sister   . Breast cancer Maternal Aunt 55       31 of old age  . Diabetes Paternal Grandmother     ROS: Review of Systems  Constitutional: Positive for malaise/fatigue and weight loss.  Gastrointestinal: Negative for nausea and vomiting.  Musculoskeletal:       Negative for muscle weakness.     PHYSICAL EXAM: Blood pressure 120/70, pulse 81, temperature 97.8 F (36.6 C), temperature source Oral, height '5\' 3"'  (1.6 m), weight 255 lb (115.7 kg), last menstrual period 06/14/1998, SpO2 99 %. Body mass index is 45.17 kg/m. Physical Exam  Constitutional: She is oriented to person, place, and time. She appears well-developed and well-nourished.  HENT:  Head: Normocephalic.  Eyes: Pupils are equal, round, and reactive to light. EOM are normal.  Neck: Normal range of motion.  Cardiovascular: Normal rate.  Pulmonary/Chest: Effort normal.  Neurological: She is alert and oriented to person, place, and time.  Skin: Skin is warm and dry.  Psychiatric: She has a normal mood and affect. Her behavior is normal.  Vitals reviewed.   RECENT LABS AND TESTS: BMET    Component Value Date/Time   NA 136 01/26/2018 1143   K 4.5 01/26/2018 1143   CL 107 (H) 01/26/2018 1143   CO2 18 (L) 01/26/2018 1143   GLUCOSE 95 01/26/2018 1143   GLUCOSE 139 (H) 05/16/2017 1040   BUN 41 (H) 01/26/2018 1143   CREATININE 1.64 (H) 01/26/2018 1143   CREATININE 1.64 (H) 05/16/2017 1040   CALCIUM 9.1 01/26/2018 1143   GFRNONAA 31 (L) 01/26/2018 1143   GFRAA 35 (L) 01/26/2018 1143   Lab Results  Component Value  Date   HGBA1C 6.1 (H) 01/26/2018   HGBA1C 7.0 (H) 10/03/2017   HGBA1C 6.4 05/16/2017   HGBA1C 6.6 07/29/2016   HGBA1C 5.9 03/29/2016   Lab Results  Component Value Date   INSULIN 8.7 10/03/2017   CBC    Component Value Date/Time   WBC 7.0 10/03/2017 1224   WBC 7.4 05/16/2017 1040   RBC 4.42 10/03/2017 1224   RBC 4.51 05/16/2017 1040  HGB 12.5 10/03/2017 1224   HCT 39.4 10/03/2017 1224   PLT 260 05/16/2017 1040   MCV 89 10/03/2017 1224   MCH 28.3 10/03/2017 1224   MCH 27.9 05/16/2017 1040   MCHC 31.7 10/03/2017 1224   MCHC 32.3 05/16/2017 1040   RDW 15.3 10/03/2017 1224   LYMPHSABS 1.7 10/03/2017 1224   MONOABS 558 03/29/2016 1335   EOSABS 0.3 10/03/2017 1224   BASOSABS 0.0 10/03/2017 1224   Iron/TIBC/Ferritin/ %Sat No results found for: IRON, TIBC, FERRITIN, IRONPCTSAT Lipid Panel     Component Value Date/Time   CHOL 163 10/03/2017 1224   TRIG 140 10/03/2017 1224   HDL 42 10/03/2017 1224   CHOLHDL 4.4 05/16/2017 1040   VLDL 24 09/27/2016 1047   LDLCALC 93 10/03/2017 1224   LDLCALC 122 (H) 05/16/2017 1040   Hepatic Function Panel     Component Value Date/Time   PROT 6.6 01/26/2018 1143   ALBUMIN 3.9 01/26/2018 1143   AST 16 01/26/2018 1143   ALT 10 01/26/2018 1143   ALKPHOS 79 01/26/2018 1143   BILITOT 0.3 01/26/2018 1143      Component Value Date/Time   TSH 2.900 10/03/2017 1224   TSH 3.80 05/16/2017 1040   TSH 4.15 09/24/2015 0828    Ref. Range 01/26/2018 11:43  Vitamin D, 25-Hydroxy Latest Ref Range: 30.0 - 100.0 ng/mL 27.4 (L)    ASSESSMENT AND PLAN: Vitamin D deficiency - Plan: Vitamin D, Ergocalciferol, (DRISDOL) 50000 units CAPS capsule  Type 2 diabetes mellitus with diabetic nephropathy, with long-term current use of insulin (HCC)  Class 3 severe obesity with serious comorbidity and body mass index (BMI) of 45.0 to 49.9 in adult, unspecified obesity type ([place])  PLAN: Vitamin D Deficiency Ardie was informed that low vitamin D levels  contributes to fatigue and are associated with obesity, breast, and colon cancer. She agrees to continue to take prescription Vit D '@50' ,000 IU every week #4 with no refills and will follow up for routine testing of vitamin D, at least 2-3 times per year. She was informed of the risk of over-replacement of vitamin D and agrees to not increase her dose unless she discusses this with [name] first. She agrees to follow up with our clinic as directed.   Diabetes II Kerri Richardson has been given extensive diabetes education by myself today including ideal fasting and post-prandial blood glucose readings, individual ideal HgA1c goals  and hypoglycemia prevention. We discussed the importance of good blood sugar control to decrease the likelihood of diabetic complications such as nephropathy, neuropathy, limb loss, blindness, coronary artery disease, and death. We discussed the importance of intensive lifestyle modification including diet, exercise and weight loss as the first line treatment for diabetes. She will continue with diet and exercise and watch for signs and symptoms for hypoglycemia. Will monitor closely. Kerri Richardson agrees to continue her diabetes medications and will follow up at the agreed upon time.  Obesity Kerri Richardson is currently in the action stage of change. As such, her goal is to continue with weight loss efforts She has agreed to follow the Category 2 plan Kerri Richardson has been instructed to work up to a goal of 150 minutes of combined cardio and strengthening exercise per week for weight loss and overall health benefits. We discussed looking into silver sneakers classes and see what looks interesting.  We discussed the following Behavioral Modification Strategies today: increasing lean protein intake and work on meal planning and easy cooking plans and no skipping meals.   Kerri Richardson has agreed  to follow up with our clinic in 2-3 weeks. She was informed of the importance of frequent follow up visits to maximize her success with  intensive lifestyle modifications for her multiple health conditions.   OBESITY BEHAVIORAL INTERVENTION VISIT  Today's visit was # 8   Starting weight: 286 lbs Starting date: 10/03/17 Today's weight : 255 lbs Today's date: 02/16/18 Total lbs lost to date: 31 lbs At least 15 minutes were spent on discussing the following behavioral intervention visit.   ASK: We discussed the diagnosis of obesity with Kerri Richardson today and Kenyette agreed to give [name] permission to discuss obesity behavioral modification therapy today.  ASSESS: Averill has the diagnosis of obesity and her BMI today is 45.18 Burna is in the action stage of change   ADVISE: Fidelia was educated on the multiple health risks of obesity as well as the benefit of weight loss to improve her health. She was advised of the need for long term treatment and the importance of lifestyle modifications to improve her current health and to decrease her risk of future health problems.  AGREE: Multiple dietary modification options and treatment options were discussed and  Zamarah agreed to follow the recommendations documented in the above note.  ARRANGE: Natassia was educated on the importance of frequent visits to treat obesity as outlined per CMS and USPSTF guidelines and agreed to schedule her next follow up appointment today.  I, Renee Ramus, am acting as transcriptionist for Dennard Nip, MD  I have reviewed the above documentation for accuracy and completeness, and I agree with the above. -Dennard Nip, MD

## 2018-02-21 ENCOUNTER — Other Ambulatory Visit: Payer: Self-pay | Admitting: Family Medicine

## 2018-02-21 ENCOUNTER — Encounter: Payer: Self-pay | Admitting: Sports Medicine

## 2018-02-21 ENCOUNTER — Ambulatory Visit: Payer: Medicare PPO | Admitting: Sports Medicine

## 2018-02-21 DIAGNOSIS — N183 Chronic kidney disease, stage 3 unspecified: Secondary | ICD-10-CM

## 2018-02-21 DIAGNOSIS — E1122 Type 2 diabetes mellitus with diabetic chronic kidney disease: Secondary | ICD-10-CM | POA: Diagnosis not present

## 2018-02-21 DIAGNOSIS — M79675 Pain in left toe(s): Secondary | ICD-10-CM

## 2018-02-21 DIAGNOSIS — B351 Tinea unguium: Secondary | ICD-10-CM | POA: Diagnosis not present

## 2018-02-21 DIAGNOSIS — I739 Peripheral vascular disease, unspecified: Secondary | ICD-10-CM

## 2018-02-21 DIAGNOSIS — Z6841 Body Mass Index (BMI) 40.0 and over, adult: Secondary | ICD-10-CM

## 2018-02-21 DIAGNOSIS — M79674 Pain in right toe(s): Secondary | ICD-10-CM

## 2018-02-21 DIAGNOSIS — M109 Gout, unspecified: Secondary | ICD-10-CM

## 2018-02-21 NOTE — Progress Notes (Signed)
Subjective: Kerri Richardson is a 77 y.o. female patient with history of diabetes who returns to office today complaining of long,mildly painful nails while ambulating in shoes; unable to trim. Patient states that the glucose reading this morning was not checked last a1c was 6.1. Patient denies any new changes in medication or new problems since last visit.  No other issues noted.   Patient Active Problem List   Diagnosis Date Noted  . Irregular heartbeat 08/10/2016  . Acute gout 09/24/2015  . Morbid obesity with body mass index of 45.0-49.9 in adult [facility]) 06/10/2014  . Diabetes mellitus with stage 3 chronic kidney disease ([place]) 03/25/2014  . Hypertensive nephropathy 03/25/2014  . Type 2 diabetes, controlled, with retinopathy ([place]) 12/10/2013  . Peripheral edema 12/22/2012  . Diabetic retinopathy ([place]) 08/23/2012  . Mixed hyperlipidemia 05/03/2011  . Type II or unspecified type diabetes mellitus without mention of complication, not stated as uncontrolled 02/01/2011  . Pure hypercholesterolemia 02/01/2011  . Essential hypertension, benign 02/01/2011  . CKD (chronic kidney disease), stage III ([place]) 02/01/2011   Current Outpatient Medications on File Prior to Visit  Medication Sig Dispense Refill  . ACCU-CHEK FASTCLIX LANCETS MISC TEST TWO TIMES DAILY 204 each 2  . ACCU-CHEK SMARTVIEW test strip TEST TWICE DAILY 200 each 2  . allopurinol (ZYLOPRIM) 100 MG tablet TAKE 1 TABLET EVERY DAY 90 tablet 0  . amLODipine (NORVASC) 10 MG tablet Take 1 tablet (10 mg total) by mouth daily. 90 tablet 1  . aspirin 81 MG tablet Take 81 mg by mouth daily.      Marland Kitchen atorvastatin (LIPITOR) 40 MG tablet TAKE 1 TABLET EVERY DAY 90 tablet 0  . BD INSULIN SYRINGE ULTRAFINE 31G X 5/16" 0.5 ML MISC USE EVERY DAY 90 each 3  . Blood Glucose Monitoring Suppl (ACCU-CHEK NANO SMARTVIEW) w/Device KIT 1 each by Does not apply route 2 (two) times daily. BID 1 kit 0  . enalapril (VASOTEC) 10 MG tablet TAKE 1 TABLET EVERY DAY  90 tablet 0  . furosemide (LASIX) 20 MG tablet Take 20 mg by mouth daily.     . insulin glargine (LANTUS) 100 UNIT/ML injection INJECT 20 UNITS EVERY NIGHT AT BEDTIME (DISCARD OPEN VIAL 28 DAYS AFTER FIRST OPEN) 30 mL 0  . latanoprost (XALATAN) 0.005 % ophthalmic solution Place 1 drop into both eyes at bedtime.      . pioglitazone (ACTOS) 30 MG tablet TAKE 1 TABLET EVERY DAY 90 tablet 0  . Vitamin D, Ergocalciferol, (DRISDOL) 50000 units CAPS capsule Take 1 capsule (50,000 Units total) by mouth every 7 (seven) days. 4 capsule 0   No current facility-administered medications on file prior to visit.    No Known Allergies  Recent Results (from the past 2160 hour(s))  Microalbumin / creatinine urine ratio     Status: None   Collection Time: 01/26/18 11:43 AM  Result Value Ref Range   Creatinine, Urine 216.5 Not Estab. mg/dL   Microalbumin, Urine 8.5 Not Estab. ug/mL   Microalb/Creat Ratio 3.9 0.0 - 30.0 mg/g creat    Comment:                      Normal:                0.0 -  30.0                      Albuminuria:          31.0 -  300.0                      Clinical albuminuria:       >300.0   VITAMIN D 25 Hydroxy (Vit-D Deficiency, Fractures)     Status: Abnormal   Collection Time: 01/26/18 11:43 AM  Result Value Ref Range   Vit D, 25-Hydroxy 27.4 (L) 30.0 - 100.0 ng/mL    Comment: Vitamin D deficiency has been defined by the [place] practice guideline as a level of serum 25-OH vitamin D less than 20 ng/mL (1,2). The Endocrine Society went on to further define vitamin D insufficiency as a level between 21 and 29 ng/mL (2). 1. IOM (Institute of Medicine). 2010. Dietary reference    intakes for calcium and D. [place]: The    [employer]. 2. Holick MF, Binkley [place], Bischoff-Ferrari HA, et al.    Evaluation, treatment, and prevention of vitamin D    deficiency: an Endocrine Society clinical practice    guideline. JCEM. 2011 Jul;  96(7):1911-30.   Hemoglobin A1c     Status: Abnormal   Collection Time: 01/26/18 11:43 AM  Result Value Ref Range   Hgb A1c MFr Bld 6.1 (H) 4.8 - 5.6 %    Comment:          Prediabetes: 5.7 - 6.4          Diabetes: >6.4          Glycemic control for adults with diabetes: <7.0    Est. average glucose Bld gHb Est-mCnc 128 mg/dL  Comprehensive metabolic panel     Status: Abnormal   Collection Time: 01/26/18 11:43 AM  Result Value Ref Range   Glucose 95 65 - 99 mg/dL   BUN 41 (H) 8 - 27 mg/dL   Creatinine, Ser 1.64 (H) 0.57 - 1.00 mg/dL   GFR calc non Af Amer 31 (L) >59 mL/min/1.73   GFR calc Af Amer 35 (L) >59 mL/min/1.73   BUN/Creatinine Ratio 25 12 - 28   Sodium 136 134 - 144 mmol/L   Potassium 4.5 3.5 - 5.2 mmol/L   Chloride 107 (H) 96 - 106 mmol/L   CO2 18 (L) 20 - 29 mmol/L   Calcium 9.1 8.7 - 10.3 mg/dL   Total Protein 6.6 6.0 - 8.5 g/dL   Albumin 3.9 3.5 - 4.8 g/dL   Globulin, Total 2.7 1.5 - 4.5 g/dL   Albumin/Globulin Ratio 1.4 1.2 - 2.2   Bilirubin Total 0.3 0.0 - 1.2 mg/dL   Alkaline Phosphatase 79 39 - 117 IU/L   AST 16 0 - 40 IU/L   ALT 10 0 - 32 IU/L  HM DIABETES EYE EXAM     Status: None   Collection Time: 01/31/18 12:00 AM  Result Value Ref Range   HM Diabetic Eye Exam No Retinopathy No Retinopathy    Objective: General: Patient is awake, alert, and oriented x 3 and in no acute distress.  Integument: Skin is warm, dry and supple bilateral. Nails are tender, long, thickened and dystrophic with subungual debris, consistent with onychomycosis, 1-5 bilateral with the most thickness on right 1st toenail and pince deformity. No signs of infection. No open lesions or preulcerative lesions present bilateral. Remaining integument unremarkable.  Vasculature:  Dorsalis Pedis pulse 1/4 bilateral. Posterior Tibial pulse  0/4 bilateral due to 1+ pitting edema to ankles Capillary fill time <3 sec 1-5 bilateral. Scant hair growth to the level of the digits.  Temperature  gradient within normal limits. Mild brawny skin changes and varicosities present bilateral.  Neurology: The patient has intact sensation measured with a 5.07/10g Semmes Weinstein Monofilament at all pedal sites bilateral. Vibratory sensation diminished bilateral with tuning fork. No Babinski sign present bilateral.   Musculoskeletal: Aspymptomatic pes planus pedal deformities noted bilateral. Muscular strength 5/5 in all lower extremity muscular groups bilateral without pain on range of motion. No tenderness with calf compression bilateral.  Assessment and Plan: Problem List Items Addressed This Visit      Endocrine   Diabetes mellitus with stage 3 chronic kidney disease ([place])     Other   Morbid obesity with body mass index of 45.0-49.9 in adult [facility])    Other Visit Diagnoses    Pain due to onychomycosis of toenails of both feet    -  Primary   PVD (peripheral vascular disease) ([place])          -Examined patient. -Discussed and educated patient on diabetic foot care, especially with  regards to the vascular, neurological and musculoskeletal systems.  -Mechanically debrided all nails 1-5 bilateral using sterile nail nipper and filed with dremel without incident  -Recommend elevation and discussing with Dr. about adjusting Lasix for edema control -Answered all patient questions -Patient to return in 3 months for at risk foot care -Patient advised to call the office if any problems or questions arise in the meantime.  Landis Martins, DPM

## 2018-03-02 ENCOUNTER — Other Ambulatory Visit: Payer: Self-pay | Admitting: Family Medicine

## 2018-03-02 DIAGNOSIS — E119 Type 2 diabetes mellitus without complications: Secondary | ICD-10-CM

## 2018-03-09 ENCOUNTER — Ambulatory Visit (INDEPENDENT_AMBULATORY_CARE_PROVIDER_SITE_OTHER): Payer: Medicare PPO | Admitting: Family Medicine

## 2018-03-09 VITALS — BP 115/71 | HR 96 | Temp 98.5°F | Ht 63.0 in | Wt 253.0 lb

## 2018-03-09 DIAGNOSIS — Z6841 Body Mass Index (BMI) 40.0 and over, adult: Secondary | ICD-10-CM | POA: Diagnosis not present

## 2018-03-09 DIAGNOSIS — E559 Vitamin D deficiency, unspecified: Secondary | ICD-10-CM | POA: Diagnosis not present

## 2018-03-09 MED ORDER — VITAMIN D (ERGOCALCIFEROL) 1.25 MG (50000 UNIT) PO CAPS: 50000.0000 [IU] | ORAL_CAPSULE | ORAL | 0 refills | Status: DC

## 2018-03-13 NOTE — Progress Notes (Signed)
Office: [phone]  /  Fax: [phone]   HPI:   Chief Complaint: OBESITY Kerri Richardson is here to discuss her progress with her obesity treatment plan. She is on the  follow the Category 2 plan and is following her eating plan approximately 90 % of the time. She states she is exercising 0 minutes 0 times per week. Kerri Richardson continues to do well with weight loss. She struggles to eat all her protein and dinner sometimes.  Her weight is 253 lb (114.8 kg) today and has had a weight loss of 2 pounds over a period of 3 weeks since her last visit. She has lost 33 lbs since starting treatment with [name].  Vitamin D deficiency Kerri Richardson has a diagnosis of vitamin D deficiency. She is currently taking vit D and denies nausea, vomiting or muscle weakness.   ALLERGIES: No Known Allergies  MEDICATIONS: Current Outpatient Medications on File Prior to Visit  Medication Sig Dispense Refill  . ACCU-CHEK FASTCLIX LANCETS MISC TEST TWO TIMES DAILY 204 each 2  . ACCU-CHEK SMARTVIEW test strip TEST TWICE DAILY 200 each 2  . allopurinol (ZYLOPRIM) 100 MG tablet TAKE 1 TABLET EVERY DAY 90 tablet 0  . amLODipine (NORVASC) 10 MG tablet Take 1 tablet (10 mg total) by mouth daily. 90 tablet 1  . aspirin 81 MG tablet Take 81 mg by mouth daily.      Marland Kitchen atorvastatin (LIPITOR) 40 MG tablet TAKE 1 TABLET EVERY DAY 90 tablet 0  . BD INSULIN SYRINGE U/F 31G X 5/16" 0.5 ML MISC INJECT EVERY DAY 90 each 3  . Blood Glucose Monitoring Suppl (ACCU-CHEK NANO SMARTVIEW) w/Device KIT 1 each by Does not apply route 2 (two) times daily. BID 1 kit 0  . enalapril (VASOTEC) 10 MG tablet TAKE 1 TABLET EVERY DAY 90 tablet 0  . furosemide (LASIX) 20 MG tablet Take 20 mg by mouth daily.     . insulin glargine (LANTUS) 100 UNIT/ML injection INJECT 20 UNITS EVERY NIGHT AT BEDTIME (DISCARD OPEN VIAL 28 DAYS AFTER FIRST OPEN) 30 mL 0  . latanoprost (XALATAN) 0.005 % ophthalmic solution Place 1 drop into both eyes at bedtime.      . pioglitazone (ACTOS)  30 MG tablet TAKE 1 TABLET EVERY DAY 90 tablet 0   No current facility-administered medications on file prior to visit.     PAST MEDICAL HISTORY: Past Medical History:  Diagnosis Date  . Arthritis   . CKD (chronic kidney disease) stage 3, GFR 30-59 ml/min (HCC)    Dr. [name]  . Diabetic retinopathy   . DM retinopathy ([place]) 08/17/12   early  . Dyspnea   . Essential hypertension, benign   . Glaucoma   . Gout   . Pure hypercholesterolemia   . Type II or unspecified type diabetes mellitus without mention of complication, not stated as uncontrolled     PAST SURGICAL HISTORY: Past Surgical History:  Procedure Laterality Date  . BRAIN SURGERY  1969   Burr holes in skull to alleviate pressure on the brain (no shunt)  . CATARACT EXTRACTION, BILATERAL  2012   Dr. [name]  . COLONOSCOPY  3/05, 11/2013   due again 2020  . COLPOSCOPY N/A 05/27/2015   Procedure: COLPOSCOPY;  Surgeon: [name], MD;  Location: [facility];  Service: Gynecology;  Laterality: N/A;  . LEEP N/A 05/27/2015   Procedure: LOOP ELECTROSURGICAL EXCISION PROCEDURE (LEEP);  Surgeon: [name], MD;  Location: [facility];  Service: Gynecology;  Laterality: N/A;  . TONSILLECTOMY  age 84    SOCIAL HISTORY: Social History   Tobacco Use  . Smoking status: Former Smoker    Last attempt to quit: 06/14/2000    Years since quitting: 17.7  . Smokeless tobacco: Never Used  Substance Use Topics  . Alcohol use: No    Alcohol/week: 0.0 standard drinks  . Drug use: No    FAMILY HISTORY: Family History  Problem Relation Age of Onset  . Hypertension Mother   . Cancer Mother        lung (nonsmoker)  . Stroke Father   . Hypertension Father   . Diabetes Father   . Hypertension Sister   . Diabetes Sister   . Heart disease Sister   . Hypertension Sister   . Breast cancer Maternal Aunt 62       47 of old age  . Diabetes Paternal Grandmother     ROS: Review of Systems  Constitutional: Positive for  weight loss.  All other systems reviewed and are negative.   PHYSICAL EXAM: Blood pressure 115/71, pulse 96, temperature 98.5 F (36.9 C), temperature source Oral, height _0  (1.6 m), weight 253 lb (114.8 kg), last menstrual period 06/14/1998, SpO2 98 %. Body mass index is 44.82 kg/m. Physical Exam  Constitutional: She is oriented to person, place, and time. She appears well-developed and well-nourished.  HENT:  Head: Normocephalic.  Eyes: EOM are normal.  Neck: Normal range of motion.  Pulmonary/Chest: Effort normal.  Musculoskeletal: Normal range of motion.  Neurological: She is alert and oriented to person, place, and time.  Skin: Skin is warm and dry.  Psychiatric: She has a normal mood and affect. Her behavior is normal.  Vitals reviewed.   RECENT LABS AND TESTS: BMET    Component Value Date/Time   NA 136 01/26/2018 1143   K 4.5 01/26/2018 1143   CL 107 (H) 01/26/2018 1143   CO2 18 (L) 01/26/2018 1143   GLUCOSE 95 01/26/2018 1143   GLUCOSE 139 (H) 05/16/2017 1040   BUN 41 (H) 01/26/2018 1143   CREATININE 1.64 (H) 01/26/2018 1143   CREATININE 1.64 (H) 05/16/2017 1040   CALCIUM 9.1 01/26/2018 1143   GFRNONAA 31 (L) 01/26/2018 1143   GFRAA 35 (L) 01/26/2018 1143   Lab Results  Component Value Date   HGBA1C 6.1 (H) 01/26/2018   HGBA1C 7.0 (H) 10/03/2017   HGBA1C 6.4 05/16/2017   HGBA1C 6.6 07/29/2016   HGBA1C 5.9 03/29/2016   Lab Results  Component Value Date   INSULIN 8.7 10/03/2017   CBC    Component Value Date/Time   WBC 7.0 10/03/2017 1224   WBC 7.4 05/16/2017 1040   RBC 4.42 10/03/2017 1224   RBC 4.51 05/16/2017 1040   HGB 12.5 10/03/2017 1224   HCT 39.4 10/03/2017 1224   PLT 260 05/16/2017 1040   MCV 89 10/03/2017 1224   MCH 28.3 10/03/2017 1224   MCH 27.9 05/16/2017 1040   MCHC 31.7 10/03/2017 1224   MCHC 32.3 05/16/2017 1040   RDW 15.3 10/03/2017 1224   LYMPHSABS 1.7 10/03/2017 1224   MONOABS 558 03/29/2016 1335   EOSABS 0.3 10/03/2017  1224   BASOSABS 0.0 10/03/2017 1224   Iron/TIBC/Ferritin/ %Sat No results found for: IRON, TIBC, FERRITIN, IRONPCTSAT Lipid Panel     Component Value Date/Time   CHOL 163 10/03/2017 1224   TRIG 140 10/03/2017 1224   HDL 42 10/03/2017 1224   CHOLHDL 4.4 05/16/2017 1040   VLDL 24 09/27/2016  [place] 10/03/2017 1224   LDLCALC 122 (H) 05/16/2017 1040   Hepatic Function Panel     Component Value Date/Time   PROT 6.6 01/26/2018 1143   ALBUMIN 3.9 01/26/2018 1143   AST 16 01/26/2018 1143   ALT 10 01/26/2018 1143   ALKPHOS 79 01/26/2018 1143   BILITOT 0.3 01/26/2018 1143      Component Value Date/Time   TSH 2.900 10/03/2017 1224   TSH 3.80 05/16/2017 1040   TSH 4.15 09/24/2015 0828    ASSESSMENT AND PLAN: Vitamin D deficiency - Plan: Vitamin D, Ergocalciferol, (DRISDOL) 50000 units CAPS capsule  Class 3 severe obesity with serious comorbidity and body mass index (BMI) of 45.0 to 49.9 in adult, unspecified obesity type (HCC)  PLAN: Vitamin D Deficiency Kerri Richardson was informed that low vitamin D levels contributes to fatigue and are associated with obesity, breast, and colon cancer. She agrees to continue to take prescription Vit D _0 ,000 IU every week in which a prescription was written today for a 30 day supply  and will follow up for routine testing of vitamin D, at least 2-3 times per year. She was informed of the risk of over-replacement of vitamin D and agrees to not increase her dose unless she discusses this with [name] first.  Obesity Kerri Richardson is currently in the action stage of change. As such, her goal is to continue with weight loss efforts She has agreed to follow the Category 2 plan Kerri Richardson has been instructed to work up to a goal of 150 minutes of combined cardio and strengthening exercise per week for weight loss and overall health benefits. We discussed the following Behavioral Modification Stratagies today: increasing lean protein intake and decreasing simple  carbohydrates also increase her H20. Patient was given 2 oz less protein substitutes to help her get all her nutrition in,    Kerri Richardson has agreed to follow up with our clinic in 3 weeks. She was informed of the importance of frequent follow up visits to maximize her success with intensive lifestyle modifications for her multiple health conditions.   OBESITY BEHAVIORAL INTERVENTION VISIT  Today's visit was # 9   Starting weight: 286 lbs Starting date: 10/03/17 Today's weight : Weight: 253 lb (114.8 kg)  Today's date: 03/09/18 Total lbs lost to date: 33 At least 15 minutes were spent on discussing the following behavioral intervention visit.   ASK: We discussed the diagnosis of obesity with Kerri Richardson today and Kerri Richardson agreed to give [name] permission to discuss obesity behavioral modification therapy today.  ASSESS: Gracy has the diagnosis of obesity and her BMI today is _1 @ Kerri Richardson is in the action stage of change   ADVISE: Cody was educated on the multiple health risks of obesity as well as the benefit of weight loss to improve her health. She was advised of the need for long term treatment and the importance of lifestyle modifications to improve her current health and to decrease her risk of future health problems.  AGREE: Multiple dietary modification options and treatment options were discussed and  Kerri Richardson agreed to follow the recommendations documented in the above note.  ARRANGE: Izora was educated on the importance of frequent visits to treat obesity as outlined per CMS and USPSTF guidelines and agreed to schedule her next follow up appointment today.  I, April Moore, am acting as Location manager for Dr Dennard Nip.  I have reviewed the above documentation for accuracy and completeness, and I agree with the above. -Manna Gose Leafy Ro,  MD   

## 2018-03-21 ENCOUNTER — Other Ambulatory Visit: Payer: Self-pay | Admitting: Family Medicine

## 2018-03-21 DIAGNOSIS — E1122 Type 2 diabetes mellitus with diabetic chronic kidney disease: Secondary | ICD-10-CM

## 2018-03-21 DIAGNOSIS — I10 Essential (primary) hypertension: Secondary | ICD-10-CM

## 2018-03-21 DIAGNOSIS — N183 Chronic kidney disease, stage 3 unspecified: Secondary | ICD-10-CM

## 2018-03-22 ENCOUNTER — Other Ambulatory Visit: Payer: Self-pay | Admitting: Family Medicine

## 2018-03-30 ENCOUNTER — Ambulatory Visit (INDEPENDENT_AMBULATORY_CARE_PROVIDER_SITE_OTHER): Payer: Medicare PPO | Admitting: Family Medicine

## 2018-03-30 VITALS — BP 135/75 | HR 96 | Temp 98.0°F | Ht 63.0 in | Wt 249.0 lb

## 2018-03-30 DIAGNOSIS — Z6841 Body Mass Index (BMI) 40.0 and over, adult: Secondary | ICD-10-CM

## 2018-03-30 DIAGNOSIS — E119 Type 2 diabetes mellitus without complications: Secondary | ICD-10-CM | POA: Diagnosis not present

## 2018-04-04 NOTE — Progress Notes (Signed)
Office: [phone]  /  Fax: [phone]   HPI:   Chief Complaint: OBESITY Kerri Richardson is here to discuss her progress with her obesity treatment plan. She is on the Category 2 plan and is following her eating plan approximately 90 % of the time. She states she is exercising 0 minutes 0 times per week. Kerri Richardson continues to do well with weight loss on her Category 2 plan. She states her hunger is controlled and no challenges with meal planning and prepping.  Her weight is 249 lb (112.9 kg) today and has had a weight loss of 4 pounds over a period of 3 weeks since her last visit. She has lost 37 lbs since starting treatment with [name].  Diabetes II Kerri Richardson has a diagnosis of diabetes type II. Kerri Richardson states fasting BGs range between 77 and 145, and last A1c was 6.1. She denies feeling  and hypoglycemic when her BGs were in the 70's. She has been working on intensive lifestyle modifications including diet, exercise, and weight loss to help control her blood glucose levels.  ALLERGIES: No Known Allergies  MEDICATIONS: Current Outpatient Medications on File Prior to Visit  Medication Sig Dispense Refill  . ACCU-CHEK AVIVA PLUS test strip TEST TWICE DAILY 200 each 0  . ACCU-CHEK SOFTCLIX LANCETS lancets TEST TWICE DAILY 100 each 0  . allopurinol (ZYLOPRIM) 100 MG tablet TAKE 1 TABLET EVERY DAY 90 tablet 0  . amLODipine (NORVASC) 10 MG tablet Take 1 tablet (10 mg total) by mouth daily. 90 tablet 1  . aspirin 81 MG tablet Take 81 mg by mouth daily.      Marland Kitchen atorvastatin (LIPITOR) 40 MG tablet TAKE 1 TABLET EVERY DAY 90 tablet 0  . BD INSULIN SYRINGE U/F 31G X 5/16" 0.5 ML MISC INJECT EVERY DAY 90 each 3  . Blood Glucose Monitoring Suppl (ACCU-CHEK NANO SMARTVIEW) w/Device KIT 1 each by Does not apply route 2 (two) times daily. BID 1 kit 0  . enalapril (VASOTEC) 10 MG tablet TAKE 1 TABLET EVERY DAY 90 tablet 0  . furosemide (LASIX) 20 MG tablet Take 20 mg by mouth daily.     . insulin glargine (LANTUS) 100  UNIT/ML injection INJECT 20 UNITS EVERY NIGHT AT BEDTIME (DISCARD OPEN VIAL 28 DAYS AFTER FIRST OPEN) (Patient taking differently: INJECT 18 UNITS EVERY NIGHT AT BEDTIME (DISCARD OPEN VIAL 28 DAYS AFTER FIRST OPEN)) 30 mL 0  . latanoprost (XALATAN) 0.005 % ophthalmic solution Place 1 drop into both eyes at bedtime.      . pioglitazone (ACTOS) 30 MG tablet TAKE 1 TABLET EVERY DAY 90 tablet 0  . Vitamin D, Ergocalciferol, (DRISDOL) 50000 units CAPS capsule Take 1 capsule (50,000 Units total) by mouth every 7 (seven) days. 4 capsule 0   No current facility-administered medications on file prior to visit.     PAST MEDICAL HISTORY: Past Medical History:  Diagnosis Date  . Arthritis   . CKD (chronic kidney disease) stage 3, GFR 30-59 ml/min (HCC)    Dr. [name]  . Diabetic retinopathy   . DM retinopathy ([place]) 08/17/12   early  . Dyspnea   . Essential hypertension, benign   . Glaucoma   . Gout   . Pure hypercholesterolemia   . Type II or unspecified type diabetes mellitus without mention of complication, not stated as uncontrolled     PAST SURGICAL HISTORY: Past Surgical History:  Procedure Laterality Date  . BRAIN SURGERY  1969   [place] holes in skull to alleviate pressure  on the brain (no shunt)  . CATARACT EXTRACTION, BILATERAL  2012   Dr. [name]  . COLONOSCOPY  3/05, 11/2013   due again 2020  . COLPOSCOPY N/A 05/27/2015   Procedure: COLPOSCOPY;  Surgeon: [name], MD;  Location: [facility];  Service: Gynecology;  Laterality: N/A;  . LEEP N/A 05/27/2015   Procedure: LOOP ELECTROSURGICAL EXCISION PROCEDURE (LEEP);  Surgeon: [name], MD;  Location: [facility];  Service: Gynecology;  Laterality: N/A;  . TONSILLECTOMY  age 25    SOCIAL HISTORY: Social History   Tobacco Use  . Smoking status: Former Smoker    Last attempt to quit: 06/14/2000    Years since quitting: 17.8  . Smokeless tobacco: Never Used  Substance Use Topics  . Alcohol use: No    Alcohol/week:  0.0 standard drinks  . Drug use: No    FAMILY HISTORY: Family History  Problem Relation Age of Onset  . Hypertension Mother   . Cancer Mother        lung (nonsmoker)  . Stroke Father   . Hypertension Father   . Diabetes Father   . Hypertension Sister   . Diabetes Sister   . Heart disease Sister   . Hypertension Sister   . Breast cancer Maternal Aunt 13       43 of old age  . Diabetes Paternal Grandmother     ROS: Review of Systems  Constitutional: Positive for weight loss.  Endo/Heme/Allergies:       Negative hypoglycemia    PHYSICAL EXAM: Blood pressure 135/75, pulse 96, temperature 98 F (36.7 C), temperature source Oral, height '5\' 3"'  (1.6 m), weight 249 lb (112.9 kg), last menstrual period 06/14/1998, SpO2 99 %. Body mass index is 44.11 kg/m. Physical Exam  Constitutional: She is oriented to person, place, and time. She appears well-developed and well-nourished.  Cardiovascular: Normal rate.  Pulmonary/Chest: Effort normal.  Musculoskeletal: Normal range of motion.  Neurological: She is oriented to person, place, and time.  Skin: Skin is warm and dry.  Psychiatric: She has a normal mood and affect. Her behavior is normal.  Vitals reviewed.   RECENT LABS AND TESTS: BMET    Component Value Date/Time   NA 136 01/26/2018 1143   K 4.5 01/26/2018 1143   CL 107 (H) 01/26/2018 1143   CO2 18 (L) 01/26/2018 1143   GLUCOSE 95 01/26/2018 1143   GLUCOSE 139 (H) 05/16/2017 1040   BUN 41 (H) 01/26/2018 1143   CREATININE 1.64 (H) 01/26/2018 1143   CREATININE 1.64 (H) 05/16/2017 1040   CALCIUM 9.1 01/26/2018 1143   GFRNONAA 31 (L) 01/26/2018 1143   GFRAA 35 (L) 01/26/2018 1143   Lab Results  Component Value Date   HGBA1C 6.1 (H) 01/26/2018   HGBA1C 7.0 (H) 10/03/2017   HGBA1C 6.4 05/16/2017   HGBA1C 6.6 07/29/2016   HGBA1C 5.9 03/29/2016   Lab Results  Component Value Date   INSULIN 8.7 10/03/2017   CBC    Component Value Date/Time   WBC 7.0 10/03/2017  1224   WBC 7.4 05/16/2017 1040   RBC 4.42 10/03/2017 1224   RBC 4.51 05/16/2017 1040   HGB 12.5 10/03/2017 1224   HCT 39.4 10/03/2017 1224   PLT 260 05/16/2017 1040   MCV 89 10/03/2017 1224   MCH 28.3 10/03/2017 1224   MCH 27.9 05/16/2017 1040   MCHC 31.7 10/03/2017 1224   MCHC 32.3 05/16/2017 1040   RDW 15.3 10/03/2017 1224  LYMPHSABS 1.7 10/03/2017 1224   MONOABS 558 03/29/2016 1335   EOSABS 0.3 10/03/2017 1224   BASOSABS 0.0 10/03/2017 1224   Iron/TIBC/Ferritin/ %Sat No results found for: IRON, TIBC, FERRITIN, IRONPCTSAT Lipid Panel     Component Value Date/Time   CHOL 163 10/03/2017 1224   TRIG 140 10/03/2017 1224   HDL 42 10/03/2017 1224   CHOLHDL 4.4 05/16/2017 1040   VLDL 24 09/27/2016 1047   LDLCALC 93 10/03/2017 1224   LDLCALC 122 (H) 05/16/2017 1040   Hepatic Function Panel     Component Value Date/Time   PROT 6.6 01/26/2018 1143   ALBUMIN 3.9 01/26/2018 1143   AST 16 01/26/2018 1143   ALT 10 01/26/2018 1143   ALKPHOS 79 01/26/2018 1143   BILITOT 0.3 01/26/2018 1143      Component Value Date/Time   TSH 2.900 10/03/2017 1224   TSH 3.80 05/16/2017 1040   TSH 4.15 09/24/2015 0828    ASSESSMENT AND PLAN: Type 2 diabetes mellitus without complication, without long-term current use of insulin (HCC)  Class 3 severe obesity with serious comorbidity and body mass index (BMI) of 45.0 to 49.9 in adult, unspecified obesity type ([place])  PLAN:  Diabetes II Jazelle has been given extensive diabetes education by myself today including ideal fasting and post-prandial blood glucose readings, individual ideal Hgb A1c goals and hypoglycemia prevention. We discussed the importance of good blood sugar control to decrease the likelihood of diabetic complications such as nephropathy, neuropathy, limb loss, blindness, coronary artery disease, and death. We discussed the importance of intensive lifestyle modification including diet, exercise and weight loss as the first line  treatment for diabetes. Kerri Richardson agrees to decrease Lantus to 18 units qhs and she agrees to continue Actos. Kerri Richardson agrees to follow up with our clinic in 2 to 3 weeks.  Obesity Kerri Richardson is currently in the action stage of change. As such, her goal is to continue with weight loss efforts She has agreed to follow the Category 2 plan Kerri Richardson has been instructed to work up to a goal of 150 minutes of combined cardio and strengthening exercise per week for weight loss and overall health benefits. We discussed the following Behavioral Modification Strategies today: increasing lean protein intake and decreasing simple carbohydrates    Kerri Richardson has agreed to follow up with our clinic in 2 to 3 weeks. She was informed of the importance of frequent follow up visits to maximize her success with intensive lifestyle modifications for her multiple health conditions.   OBESITY BEHAVIORAL INTERVENTION VISIT  Today's visit was # 10   Starting weight: 286 lbs Starting date: 10/03/17 Today's weight : 249 lbs Today's date: 03/30/2018 Total lbs lost to date: 37 At least 15 minutes were spent on discussing the following behavioral intervention visit.   ASK: We discussed the diagnosis of obesity with Kerri Richardson today and Kerri Richardson agreed to give [name] permission to discuss obesity behavioral modification therapy today.  ASSESS: Kerri Richardson has the diagnosis of obesity and her BMI today is 44.12 Kerri Richardson is in the action stage of change   ADVISE: Kerri Richardson was educated on the multiple health risks of obesity as well as the benefit of weight loss to improve her health. She was advised of the need for long term treatment and the importance of lifestyle modifications to improve her current health and to decrease her risk of future health problems.  AGREE: Multiple dietary modification options and treatment options were discussed and  Kerri Richardson agreed to follow the recommendations documented  in the above note.  ARRANGE: Kerri Richardson was educated on the  importance of frequent visits to treat obesity as outlined per CMS and USPSTF guidelines and agreed to schedule her next follow up appointment today.  I, Trixie Dredge, am acting as transcriptionist for Dennard Nip, MD  I have reviewed the above documentation for accuracy and completeness, and I agree with the above. -Dennard Nip, MD

## 2018-04-20 ENCOUNTER — Ambulatory Visit (INDEPENDENT_AMBULATORY_CARE_PROVIDER_SITE_OTHER): Payer: Medicare PPO | Admitting: Family Medicine

## 2018-04-20 VITALS — BP 119/68 | HR 87 | Ht 62.0 in | Wt 248.0 lb

## 2018-04-20 DIAGNOSIS — Z794 Long term (current) use of insulin: Secondary | ICD-10-CM | POA: Diagnosis not present

## 2018-04-20 DIAGNOSIS — E119 Type 2 diabetes mellitus without complications: Secondary | ICD-10-CM | POA: Diagnosis not present

## 2018-04-20 DIAGNOSIS — Z6841 Body Mass Index (BMI) 40.0 and over, adult: Secondary | ICD-10-CM

## 2018-04-20 DIAGNOSIS — E559 Vitamin D deficiency, unspecified: Secondary | ICD-10-CM | POA: Diagnosis not present

## 2018-04-20 MED ORDER — VITAMIN D (ERGOCALCIFEROL) 1.25 MG (50000 UNIT) PO CAPS: 50000.0000 [IU] | ORAL_CAPSULE | ORAL | 0 refills | Status: DC

## 2018-04-24 NOTE — Progress Notes (Signed)
Office: [phone]  /  Fax: [phone]   HPI:   Chief Complaint: OBESITY Kerri Richardson is here to discuss her progress with her obesity treatment plan. She is on the Category 2 plan and is following her eating plan approximately 90 % of the time. She states she is walking 10 minutes 2 times per week. [name] is not eating all of her protein on the Category 2 plan. Her weight is 248 lb (112.5 kg) today and has had a weight loss of 1 pound over a period of 3 weeks since her last visit. She has lost 38 lbs since starting treatment with [name].  Vitamin D deficiency Kerri Richardson has a diagnosis of vitamin D deficiency. She is stable on prescription vit D, but she is not yet at goal. Kerri Richardson denies nausea, vomiting or muscle weakness.  Diabetes II Kerri Richardson has a diagnosis of diabetes type II. [name] states fasting BGs range between 75 and 90 and 2 hour post prandial BGs range between 140 and 150. Kerri Richardson is currently well controlled. She denies any hypoglycemic episodes. Last A1c was at 6.1 She has been working on intensive lifestyle modifications including diet, exercise, and weight loss to help control her blood glucose levels.  ALLERGIES: No Known Allergies  MEDICATIONS: Current Outpatient Medications on File Prior to Visit  Medication Sig Dispense Refill  . ACCU-CHEK AVIVA PLUS test strip TEST TWICE DAILY 200 each 0  . ACCU-CHEK SOFTCLIX LANCETS lancets TEST TWICE DAILY 100 each 0  . allopurinol (ZYLOPRIM) 100 MG tablet TAKE 1 TABLET EVERY DAY 90 tablet 0  . amLODipine (NORVASC) 10 MG tablet Take 1 tablet (10 mg total) by mouth daily. 90 tablet 1  . aspirin 81 MG tablet Take 81 mg by mouth daily.      Kerri Richardson atorvastatin (LIPITOR) 40 MG tablet TAKE 1 TABLET EVERY DAY 90 tablet 0  . BD INSULIN SYRINGE U/F 31G X 5/16" 0.5 ML MISC INJECT EVERY DAY 90 each 3  . Blood Glucose Monitoring Suppl (ACCU-CHEK NANO SMARTVIEW) w/Device KIT 1 each by Does not apply route 2 (two) times daily. BID 1 kit 0  . enalapril (VASOTEC) 10 MG  tablet TAKE 1 TABLET EVERY DAY 90 tablet 0  . furosemide (LASIX) 20 MG tablet Take 20 mg by mouth daily.     . insulin glargine (LANTUS) 100 UNIT/ML injection INJECT 20 UNITS EVERY NIGHT AT BEDTIME (DISCARD OPEN VIAL 28 DAYS AFTER FIRST OPEN) (Patient taking differently: INJECT 18 UNITS EVERY NIGHT AT BEDTIME (DISCARD OPEN VIAL 28 DAYS AFTER FIRST OPEN)) 30 mL 0  . latanoprost (XALATAN) 0.005 % ophthalmic solution Place 1 drop into both eyes at bedtime.      . pioglitazone (ACTOS) 30 MG tablet TAKE 1 TABLET EVERY DAY 90 tablet 0   No current facility-administered medications on file prior to visit.     PAST MEDICAL HISTORY: Past Medical History:  Diagnosis Date  . Arthritis   . CKD (chronic kidney disease) stage 3, GFR 30-59 ml/min (HCC)    Dr. [name]  . Diabetic retinopathy   . DM retinopathy ([place]) 08/17/12   early  . Dyspnea   . Essential hypertension, benign   . Glaucoma   . Gout   . Pure hypercholesterolemia   . Type II or unspecified type diabetes mellitus without mention of complication, not stated as uncontrolled     PAST SURGICAL HISTORY: Past Surgical History:  Procedure Laterality Date  . [place] holes in skull to alleviate  pressure on the brain (no shunt)  . CATARACT EXTRACTION, BILATERAL  2012   Dr. [name]  . COLONOSCOPY  3/05, 11/2013   due again 2020  . COLPOSCOPY N/A 05/27/2015   Procedure: COLPOSCOPY;  Surgeon: [name], MD;  Location: [facility];  Service: Gynecology;  Laterality: N/A;  . LEEP N/A 05/27/2015   Procedure: LOOP ELECTROSURGICAL EXCISION PROCEDURE (LEEP);  Surgeon: [name], MD;  Location: [facility];  Service: Gynecology;  Laterality: N/A;  . TONSILLECTOMY  age 77    SOCIAL HISTORY: Social History   Tobacco Use  . Smoking status: Former Smoker    Last attempt to quit: 06/14/2000    Years since quitting: 17.8  . Smokeless tobacco: Never Used  Substance Use Topics  . Alcohol use: No    Alcohol/week:  0.0 standard drinks  . Drug use: No    FAMILY HISTORY: Family History  Problem Relation Age of Onset  . Hypertension Mother   . Cancer Mother        lung (nonsmoker)  . Stroke Father   . Hypertension Father   . Diabetes Father   . Hypertension Sister   . Diabetes Sister   . Heart disease Sister   . Hypertension Sister   . Breast cancer Maternal Aunt 9       79 of old age  . Diabetes Paternal Grandmother     ROS: Review of Systems  Constitutional: Positive for weight loss.  Gastrointestinal: Negative for nausea and vomiting.  Musculoskeletal:       Negative for muscle weakness  Endo/Heme/Allergies:       Negative for hypoglycemia    PHYSICAL EXAM: Blood pressure 119/68, pulse 87, height _0  (1.575 m), weight 248 lb (112.5 kg), last menstrual period 06/14/1998, SpO2 96 %. Body mass index is 45.36 kg/m. Physical Exam  Constitutional: She is oriented to person, place, and time. She appears well-developed and well-nourished.  Cardiovascular: Normal rate.  Pulmonary/Chest: Effort normal.  Musculoskeletal: Normal range of motion.  Neurological: She is oriented to person, place, and time.  Skin: Skin is warm and dry.  Psychiatric: She has a normal mood and affect. Her behavior is normal.  Vitals reviewed.   RECENT LABS AND TESTS: BMET    Component Value Date/Time   NA 136 01/26/2018 1143   K 4.5 01/26/2018 1143   CL 107 (H) 01/26/2018 1143   CO2 18 (L) 01/26/2018 1143   GLUCOSE 95 01/26/2018 1143   GLUCOSE 139 (H) 05/16/2017 1040   BUN 41 (H) 01/26/2018 1143   CREATININE 1.64 (H) 01/26/2018 1143   CREATININE 1.64 (H) 05/16/2017 1040   CALCIUM 9.1 01/26/2018 1143   GFRNONAA 31 (L) 01/26/2018 1143   GFRAA 35 (L) 01/26/2018 1143   Lab Results  Component Value Date   HGBA1C 6.1 (H) 01/26/2018   HGBA1C 7.0 (H) 10/03/2017   HGBA1C 6.4 05/16/2017   HGBA1C 6.6 07/29/2016   HGBA1C 5.9 03/29/2016   Lab Results  Component Value Date   INSULIN 8.7 10/03/2017     CBC    Component Value Date/Time   WBC 7.0 10/03/2017 1224   WBC 7.4 05/16/2017 1040   RBC 4.42 10/03/2017 1224   RBC 4.51 05/16/2017 1040   HGB 12.5 10/03/2017 1224   HCT 39.4 10/03/2017 1224   PLT 260 05/16/2017 1040   MCV 89 10/03/2017 1224   MCH 28.3 10/03/2017 1224   MCH 27.9 05/16/2017 1040   MCHC 31.7 10/03/2017 1224  MCHC 32.3 05/16/2017 1040   RDW 15.3 10/03/2017 1224   LYMPHSABS 1.7 10/03/2017 1224   MONOABS 558 03/29/2016 1335   EOSABS 0.3 10/03/2017 1224   BASOSABS 0.0 10/03/2017 1224   Iron/TIBC/Ferritin/ %Sat No results found for: IRON, TIBC, FERRITIN, IRONPCTSAT Lipid Panel     Component Value Date/Time   CHOL 163 10/03/2017 1224   TRIG 140 10/03/2017 1224   HDL 42 10/03/2017 1224   CHOLHDL 4.4 05/16/2017 1040   VLDL 24 09/27/2016 1047   LDLCALC 93 10/03/2017 1224   LDLCALC 122 (H) 05/16/2017 1040   Hepatic Function Panel     Component Value Date/Time   PROT 6.6 01/26/2018 1143   ALBUMIN 3.9 01/26/2018 1143   AST 16 01/26/2018 1143   ALT 10 01/26/2018 1143   ALKPHOS 79 01/26/2018 1143   BILITOT 0.3 01/26/2018 1143      Component Value Date/Time   TSH 2.900 10/03/2017 1224   TSH 3.80 05/16/2017 1040   TSH 4.15 09/24/2015 0828   Results for Mo[place], Asli M "MARIE" (MRN [id number]) as of 04/24/2018 16:37  Ref. Range 01/26/2018 11:43  Vitamin D, 25-Hydroxy Latest Ref Range: 30.0 - 100.0 ng/mL 27.4 (L)   ASSESSMENT AND PLAN: Vitamin D deficiency - Plan: Vitamin D, Ergocalciferol, (DRISDOL) 1.25 MG (50000 UT) CAPS capsule  Type 2 diabetes mellitus without complication, with long-term current use of insulin (HCC)  Class 3 severe obesity with serious comorbidity and body mass index (BMI) of 45.0 to 49.9 in adult, unspecified obesity type ([place])  PLAN:  Vitamin D Deficiency Ed was informed that low vitamin D levels contributes to fatigue and are associated with obesity, breast, and colon cancer. She agrees to continue to take prescription  Vit D _0 ,000 IU every week #4 with no refills and will follow up for routine testing of vitamin D, at least 2-3 times per year. She was informed of the risk of over-replacement of vitamin D and agrees to not increase her dose unless she discusses this with [name] first. Anaise will follow up as directed.  Diabetes II Aliviah has been given extensive diabetes education by myself today including ideal fasting and post-prandial blood glucose readings, individual ideal Hgb A1c goals and hypoglycemia prevention. We discussed the importance of good blood sugar control to decrease the likelihood of diabetic complications such as nephropathy, neuropathy, limb loss, blindness, coronary artery disease, and death. We discussed the importance of intensive lifestyle modification including diet, exercise and weight loss as the first line treatment for diabetes. Marcey agrees to continue Lantus 18 units at bedtime and continue Actos. Laquitta will follow up at the agreed upon time.  Obesity Leandra is currently in the action stage of change. As such, her goal is to continue with weight loss efforts She has agreed to follow the Category 2 plan and she will eat all protein first, before carbohydrates or vegetables Kishia will continue walking for 10 minutes 2 times per week for weight loss and overall health benefits. We discussed the following Behavioral Modification Strategies today: planning for success, increasing lean protein intake and holiday eating strategies   Jacquette has agreed to follow up with our clinic in 3 weeks. She was informed of the importance of frequent follow up visits to maximize her success with intensive lifestyle modifications for her multiple health conditions.   OBESITY BEHAVIORAL INTERVENTION VISIT  Today's visit was # 11   Starting weight: 286 lbs Starting date: 10/03/17 Today's weight : 248 lbs  Today's date: 04/20/2018 Total lbs  lost to date: 55 At least 15 minutes were spent on discussing the  following behavioral intervention visit.   ASK: We discussed the diagnosis of obesity with Brita Romp today and Tajanae agreed to give [name] permission to discuss obesity behavioral modification therapy today.  ASSESS: Briget has the diagnosis of obesity and her BMI today is 45.35 Sameeha is in the action stage of change   ADVISE: Marjon was educated on the multiple health risks of obesity as well as the benefit of weight loss to improve her health. She was advised of the need for long term treatment and the importance of lifestyle modifications to improve her current health and to decrease her risk of future health problems.  AGREE: Multiple dietary modification options and treatment options were discussed and  Ainslee agreed to follow the recommendations documented in the above note.  ARRANGE: Shaunie was educated on the importance of frequent visits to treat obesity as outlined per CMS and USPSTF guidelines and agreed to schedule her next follow up appointment today.  I, Doreene Nest, am acting as transcriptionist for Dennard Nip, MD  I have reviewed the above documentation for accuracy and completeness, and I agree with the above. -Dennard Nip, MD

## 2018-04-26 ENCOUNTER — Other Ambulatory Visit: Payer: Self-pay | Admitting: Family Medicine

## 2018-04-26 DIAGNOSIS — N183 Chronic kidney disease, stage 3 unspecified: Secondary | ICD-10-CM

## 2018-04-26 DIAGNOSIS — E1122 Type 2 diabetes mellitus with diabetic chronic kidney disease: Secondary | ICD-10-CM

## 2018-04-26 DIAGNOSIS — M109 Gout, unspecified: Secondary | ICD-10-CM

## 2018-05-10 ENCOUNTER — Ambulatory Visit (INDEPENDENT_AMBULATORY_CARE_PROVIDER_SITE_OTHER): Payer: Medicare PPO | Admitting: Family Medicine

## 2018-05-10 ENCOUNTER — Encounter (INDEPENDENT_AMBULATORY_CARE_PROVIDER_SITE_OTHER): Payer: Self-pay | Admitting: Family Medicine

## 2018-05-10 VITALS — BP 130/75 | HR 124 | Temp 98.3°F | Ht 62.0 in | Wt 246.0 lb

## 2018-05-10 DIAGNOSIS — Z6841 Body Mass Index (BMI) 40.0 and over, adult: Secondary | ICD-10-CM | POA: Diagnosis not present

## 2018-05-10 DIAGNOSIS — Z794 Long term (current) use of insulin: Secondary | ICD-10-CM | POA: Diagnosis not present

## 2018-05-10 DIAGNOSIS — N183 Chronic kidney disease, stage 3 unspecified: Secondary | ICD-10-CM

## 2018-05-10 DIAGNOSIS — E1122 Type 2 diabetes mellitus with diabetic chronic kidney disease: Secondary | ICD-10-CM

## 2018-05-10 DIAGNOSIS — E559 Vitamin D deficiency, unspecified: Secondary | ICD-10-CM

## 2018-05-10 MED ORDER — VITAMIN D (ERGOCALCIFEROL) 1.25 MG (50000 UNIT) PO CAPS: 50000.0000 [IU] | ORAL_CAPSULE | ORAL | 0 refills | Status: DC

## 2018-05-16 NOTE — Progress Notes (Signed)
Chief Complaint  Patient presents with  . Medicare Wellness    fasting AWV/CPE no pap-sees Dr.Silva and is UTD. Did not do eye exam as she stated she sees eye doctor twice yearly. No concerns.     Kerri Richardson is a 77 y.o. female who presents for annual physical exam, Medicare wellness visit and follow-up on chronic medical conditions.   She continues to be treated at [facility], and has lost about 40# so far. She reports she has done this by stopping eating sweets and carbs.  Diabetes follow-up:Diabetes has been well controlled, closely monitored by the weight loss clinic.  Her Lantus dose has been titrated down, current dose is 18 U. Sugars have been running 75-93 in the mornings, and 106-167 3 hours after eating. Denies hypoglycemia. Last eye exam was 01/2018. Patient follows a low sugar diet and checks feet regularly without concerns. Lab Results  Component Value Date   HGBA1C 6.1 (H) 01/26/2018    Hyperlipidemia follow-up: Patient is reportedly following a low-fat, low cholesterol diet. Compliant with medications and denies medication side effects. Lab Results  Component Value Date   CHOL 163 10/03/2017   HDL 42 10/03/2017   LDLCALC 93 10/03/2017   TRIG 140 10/03/2017   CHOLHDL 4.4 05/16/2017    Hypertension follow-up: Blood pressure is not checked elsewhere, except at other doctor visits, and has been fine. Denies dizziness, headaches, chest pain. Denies side effects of medications. BP Readings from Last 3 Encounters:  05/10/18 130/75  04/20/18 119/68  03/30/18 135/75    Vitamin D deficiency--this is being monitored and treated by the weight loss clinic. Last level was 27.4 in 01/2018.  She is currently taking 50,000 IU weekly (just got refill #4 last week)  H/o toe pain--goutand degenerative changes. On allopurinol for prevention, with colchicine for prn use (hasn't needed in a long time).Only on 129mof allopurinol(dose not  increased due to impaired Cr and lack of gout symptoms on this dose).Denies any pain or swelling. Lab Results  Component Value Date   LABURIC 6.5 05/16/2017   CKD--sees Dr. PFlorene Glenyearly--he is retiring, and she will need to see one of his partners for next visit.Denies any edema, on lasix 254mdaily.    Immunization History  Administered Date(s) Administered  . Influenza Split 05/05/2011, 03/23/2012  . Influenza, High Dose Seasonal PF 04/03/2014, 02/20/2015, 03/29/2016, 03/11/2017, 04/28/2018  . Pneumococcal Conjugate-13 12/10/2013  . Pneumococcal Polysaccharide-23 01/26/2010  . Tdap 10/19/2007  . Zoster 10/19/2007    She forgot to get the Tdap and shingrix from the pharmacy. She is watching her 6 month great grandchild. Last Pap smear:07/2017, normal, no high risk HPV (by Dr. SiQuincy Simmonds S/p LEEP for +HR HPV, final pathology LGSILin 05/2015 with Dr. SiQuincy SimmondsScheduled to see Dr. SiQuincy Simmonds/2020. Last mammogram: 08/05/17 Last colonoscopy: 11/26/13, +polyp, due again in 5 years Last DEXA: 01/2010--normal Ophtho:twice yearly Dentist: has dentures, and just one tooth; last seen 02/2013 (got new dentures) Exercise: walks up her street 3 days/week, 10-15 minutes.  [facility] since she is now again watching her great grandson.  Other doctors caring for patient include: Ophtho: Dr. GrKaty Fitchephro: Dr. PoFlorene Glenretiring) GI: Dr. MaCollene MaresYN: Dr. SiQuincy Simmondsrtho: Dr. BeTonita Congeight Loss: Drs. Beasley/Kadolph  Depression screen: Negative Fall screen: negative Functional Status Survery: Notable forsome decrease in hearing, not worse than last year. Some pain walking due to right knee pain (uses cane most  of the time) Mini-Cog screen: normal See full screens in epic  End of Life Discussion: Patient does not havea living will and medical power of attorney. Plans to ask her God-son. She still has forms.  The notary part and him being busy has been  what has kept her from doing it (thought he needed to be present when notarized).  Past Medical History:  Diagnosis Date  . Arthritis   . CKD (chronic kidney disease) stage 3, GFR 30-59 ml/min (HCC)    Dr. [name]  . Diabetic retinopathy   . DM retinopathy ([place]) 08/17/12   early  . Dyspnea   . Essential hypertension, benign   . Glaucoma   . Gout   . Pure hypercholesterolemia   . Type II or unspecified type diabetes mellitus without mention of complication, not stated as uncontrolled     Past Surgical History:  Procedure Laterality Date  . BRAIN SURGERY  1969   Burr holes in skull to alleviate pressure on the brain (no shunt)  . CATARACT EXTRACTION, BILATERAL  2012   Dr. [name]  . COLONOSCOPY  3/05, 11/2013   due again 2020  . COLPOSCOPY N/A 05/27/2015   Procedure: COLPOSCOPY;  Surgeon: [name], MD;  Location: [facility];  Service: Gynecology;  Laterality: N/A;  . LEEP N/A 05/27/2015   Procedure: LOOP ELECTROSURGICAL EXCISION PROCEDURE (LEEP);  Surgeon: [name], MD;  Location: [facility];  Service: Gynecology;  Laterality: N/A;  . TONSILLECTOMY  age 65    Social History   Socioeconomic History  . Marital status: Single    Spouse name: Not on file  . Number of children: Not on file  . Years of education: Not on file  . Highest education level: Not on file  Occupational History  . Occupation: Retired  Scientific laboratory technician  . Financial resource strain: Not on file  . Food insecurity:    Worry: Not on file    Inability: Not on file  . Transportation needs:    Medical: Not on file    Non-medical: Not on file  Tobacco Use  . Smoking status: Former Smoker    Last attempt to quit: 06/14/2000    Years since quitting: 17.9  . Smokeless tobacco: Never Used  Substance and Sexual Activity  . Alcohol use: No    Alcohol/week: 0.0 standard drinks  . Drug use: No  . Sexual activity: Never    Partners: Male    Birth control/protection: Post-menopausal  Lifestyle   . Physical activity:    Days per week: Not on file    Minutes per session: Not on file  . Stress: Not on file  Relationships  . Social connections:    Talks on phone: Not on file    Gets together: Not on file    Attends religious service: Not on file    Active member of club or organization: Not on file    Attends meetings of clubs or organizations: Not on file    Relationship status: Not on file  . Intimate partner violence:    Fear of current or ex partner: Not on file    Emotionally abused: Not on file    Physically abused: Not on file    Forced sexual activity: Not on file  Other Topics Concern  . Not on file  Social History Narrative   Worked at [employer], retired 12/2013. Lives alone.  She has a sister in [place].  No children. Her  God-son lives in [place].   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 Hypertension Mother   . Cancer Mother        lung (nonsmoker)  . Stroke Father   . Hypertension Father   . Diabetes Father   . Hypertension Sister   . Diabetes Sister   . Heart disease Sister   . Hypertension Sister   . Breast cancer Maternal Aunt 28       35 of old age  . Diabetes Paternal Grandmother     Outpatient Encounter Medications as of 05/18/2018  Medication Sig Note  . ACCU-CHEK AVIVA PLUS test strip TEST TWICE DAILY   . ACCU-CHEK SOFTCLIX LANCETS lancets TEST BLOOD SUGAR TWICE DAILY   . allopurinol (ZYLOPRIM) 100 MG tablet TAKE 1 TABLET EVERY DAY   . amLODipine (NORVASC) 10 MG tablet Take 1 tablet (10 mg total) by mouth daily.   Marland Kitchen aspirin 81 MG tablet Take 81 mg by mouth daily.     Marland Kitchen atorvastatin (LIPITOR) 40 MG tablet TAKE 1 TABLET EVERY DAY   . BD INSULIN SYRINGE U/F 31G X 5/16" 0.5 ML MISC INJECT EVERY DAY   . Blood Glucose Monitoring Suppl (ACCU-CHEK NANO SMARTVIEW) w/Device KIT 1 each by Does not apply route 2 (two) times daily. BID   . enalapril (VASOTEC) 10 MG tablet TAKE 1 TABLET EVERY  DAY   . furosemide (LASIX) 20 MG tablet Take 20 mg by mouth daily.  04/15/2016: Taking 48m daily  . insulin glargine (LANTUS) 100 UNIT/ML injection INJECT 20 UNITS EVERY NIGHT AT BEDTIME (DISCARD OPEN VIAL 28 DAYS AFTER FIRST OPEN) (Patient taking differently: INJECT 18 UNITS EVERY NIGHT AT BEDTIME (DISCARD OPEN VIAL 28 DAYS AFTER FIRST OPEN))   . latanoprost (XALATAN) 0.005 % ophthalmic solution Place 1 drop into both eyes at bedtime.     . pioglitazone (ACTOS) 30 MG tablet TAKE 1 TABLET EVERY DAY   . Vitamin D, Ergocalciferol, (DRISDOL) 1.25 MG (50000 UT) CAPS capsule Take 1 capsule (50,000 Units total) by mouth every 7 (seven) days.    No facility-administered encounter medications on file as of 05/18/2018.     No Known Allergies   ROS: The patient denies anorexia, fever, headaches, vision changes, ear pain, sore throat, breast concerns, chest pain, palpitations, dizziness, syncope, dyspnea on exertion, cough, nausea, vomiting, diarrhea, constipation, abdominal pain, melena, hematochezia, indigestion/heartburn, hematuria, incontinence, dysuria, vaginal bleeding, discharge, odor or itch, genital lesions, joint pains(occasional right knee pain; slight right great toe pain last week, very mild); deniesnumbness, tingling, weakness, tremor, suspicious skin lesions, depression, anxiety, abnormal bleeding/bruising (some bruising, on aspirin), or enlarged lymph nodes. Right knee pain with walking only, only rarely swells. Has noted some hearing loss (TV is loud), not significantly different over the last year. Some swelling in her feet only when she sits too long (in office today, infrequently at home). Intentional weight loss    PHYSICAL EXAM:  BP 128/68   Pulse 80   Ht 5' 1.5" (1.562 m)   Wt 248 lb (112.5 kg)   LMP 06/14/1998 (Approximate)   BMI 46.10 kg/m   Weight was 285# 6.4oz at her last CPE 05/2017  General Appearance:   Alert, cooperative, no distress, appears stated age   Head:   Normocephalic, without obvious abnormality, atraumatic  Eyes:   PERRL, conjunctiva/corneas clear, EOM's intact, fundi not well visualized  Ears:   Normal TM's and external ear canals.  Partially obstructive cerumen noted on the left.  Nose:  Nares normal, mucosa normal, no drainage or sinus tenderness  Throat:  Lips, mucosa, and tongue normal; +dentures. One tooth remaining on right upper jaw.  Neck:  Supple, no lymphadenopathy; thyroid: no enlargement/ tenderness/nodules; no carotid bruit or JVD  Back:  Spine nontender, no curvature, ROM normal, no CVAtenderness  Lungs:   Clear to auscultation bilaterally without wheezes, rales orronchi; respirations unlabored  Chest Wall:   No tenderness or deformity  Heart:   Regular rate and rhythm, S1 and S2 normal, no murmur, rub or gallop  Breast Exam:   deferred to GYN  Abdomen:   Soft, obese, non-tender, nondistended, normoactive bowel sounds, no masses, no hepatosplenomegaly. obese.  Genitalia:   deferred to GYN  Rectal:   deferred to GYN  Extremities:  No clubbing, cyanosis. No edema. Normal sensation to light touch.  Pulses:  2+ and symmetric all extremities  Skin:  Skin color, texture, turgor normal, no rashes or lesions  Lymph nodes:  Cervical, supraclavicular, and axillary nodes normal  Neurologic:  CNII-XII intact, normal strength, sensation and gait; reflexes 2+ and symmetric throughout  Psych: Normal mood, affect, hygiene and grooming   Normal diabetic foot exam  Lab Results  Component Value Date   HGBA1C 5.5 05/18/2018     ASSESSMENT/PLAN:  Annual physical exam  Encounter for Medicare annual wellness exam  Type 2 diabetes mellitus with stage 3 chronic kidney disease, with long-term current use of insulin ([place]) - well controlled, able to taper down Lantus dose. reviewed s/sx hypoglycemia. Cont close monitoring with wt loss clinic - Plan: HgB  A1c  Essential hypertension - well controlled  Gout involving toe of right foot, unspecified cause, unspecified chronicity - no flares, on 128m allopurinol - Plan: Uric acid  Mixed hyperlipidemia - Plan: Lipid panel  Vitamin D deficiency - currently on rx treatment through wt loss clinic  Class 3 severe obesity with serious comorbidity and body mass index (BMI) of 45.0 to 49.9 in adult, unspecified obesity type ([place] - successful in weight loss with dietary changes alone, minimal exercise, to gradually increase. Cont with clinic  Medication monitoring encounter - Plan: Lipid panel, Uric acid   Lipids, uric acid D likely to be rechecked by wt loss clinic   Discussed monthly self breast exams and yearly mammograms; at least 30 minutes of aerobic activity at least 5 days/week, weight-bearing exercise 2x/week; proper sunscreen use reviewed; healthy diet, including goals of calcium and vitamin D intake and alcohol recommendations (less than or equal to 1 drink/day) reviewed; regular seatbelt use; changing batteries in smoke detectors. Immunization recommendations discussed--continue yearly high dose flu shots. Tetanus booster is due, to get at pharmacy. Shingrix recommended, to get at pharmacy. Risks/SE reviewed. Colonoscopy recommendations reviewed--due 11/2018.  MOST form discussed and completed today. Full Code, Full Care. Discussed importance ofLiving Will and [place] Advised to get u[place]oreacopies of completed forms.   F/u 6 months, sooner prn   Medicare Attestation I have personally reviewed: The patient's medical and social history Their use of alcohol, tobacco or illicit drugs Their current medications and supplements The patient's functional ability including ADLs,fall risks, home safety risks, cognitive, and hearing and visual impairment Diet and physical activities Evidence for depression or mood disorders  The patient's weight, height and BMI have been  recorded in the chart.  I have made referrals, counseling, and provided education to the patient based on review of the above and I have  provided the patient with a written personalized care plan for preventive services.

## 2018-05-17 NOTE — Patient Instructions (Signed)
  HEALTH MAINTENANCE RECOMMENDATIONS:  It is recommended that you get at least 30 minutes of aerobic exercise at least 5 days/week (for weight loss, you may need as much as 60-90 minutes). This can be any activity that gets your heart rate up. This can be divided in 10-15 minute intervals if needed, but try and build up your endurance at least once a week.  Weight bearing exercise is also recommended twice weekly.  Eat a healthy diet with lots of vegetables, fruits and fiber.  "Colorful" foods have a lot of vitamins (ie green vegetables, tomatoes, red peppers, etc).  Limit sweet tea, regular sodas and alcoholic beverages, all of which has a lot of calories and sugar.  Up to 1 alcoholic drink daily may be beneficial for women (unless trying to lose weight, watch sugars).  Drink a lot of water.  Calcium recommendations are 1200-1500 mg daily (1500 mg for postmenopausal women or women without ovaries), and vitamin D 1000 IU daily.  This should be obtained from diet and/or supplements (vitamins), and calcium should not be taken all at once, but in divided doses.  Monthly self breast exams and yearly mammograms for women over the age of 940 is recommended.  Sunscreen of at least SPF 30 should be used on all sun-exposed parts of the skin when outside between the hours of 10 am and 4 pm (not just when at beach or pool, but even with exercise, golf, tennis, and yard work!)  Use a sunscreen that says "broad spectrum" so it covers both UVA and UVB rays, and make sure to reapply every 1-2 hours.  Remember to change the batteries in your smoke detectors when changing your clock times in the spring and fall.  Use your seat belt every time you are in a car, and please drive safely and not be distracted with cell phones and texting while driving.   Kerri Richardson , Thank you for taking time to come for your Medicare Wellness Visit. I appreciate your ongoing commitment to your health goals. Please review the  following plan we discussed and let me know if I can assist you in the future.    This is a list of the screening recommended for you and due dates:  Health Maintenance  Topic Date Due  . Tetanus Vaccine  10/18/2017  . Flu Shot  01/12/2018  . Complete foot exam   05/16/2018  . Hemoglobin A1C  07/29/2018  . Eye exam for diabetics  02/01/2019  . Colon Cancer Screening  11/27/2023  . DEXA scan (bone density measurement)  Completed  . Pneumonia vaccines  Completed   Foot exam was done today. You had your flu shot--continue high dose flu shots yearly  Get your Tdap (tetanus shot) from pharmacy--I gave you a written prescription for this.  I recommend getting the new shingles vaccine (Shingrix). You will need to check with your insurance to see if it is covered, and if covered by Medicare Part D, you need to get from the pharmacy rather than our office.  It is a series of 2 injections, spaced 2 months apart.  We talked about getting some exercise with the baby or while he is sleeping (can be seated, to get your heart rate up for 10 minutes at a time).

## 2018-05-17 NOTE — Progress Notes (Signed)
Office: [phone]  /  Fax: [phone]   HPI:   Chief Complaint: OBESITY Kerri Richardson is here to discuss her progress with her obesity treatment plan. She is following the Category 2 plan and is following her eating plan approximately 90 % of the time. She states she is walking 10 minutes 3 times per week. [name] denies any polyphagia at this time.  Her weight is 246 lb (111.6 kg) today and has had a weight loss of 2 pounds over a period of 2 weeks since her last visit. She has lost 40 lbs since starting treatment with [name].  Vitamin D deficiency Kerri Richardson has a diagnosis of vitamin D deficiency. She is currently taking prescription Vit D but not at goal. She denies nausea, vomiting or muscle weakness.  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 She is currently taking Lantus 18 units nightly and Victoza. Kerri Richardson states BGs range between 70 and 90 and denies any hypoglycemic episodes. Bedtime readings range between 100-126. Last A1c was Hemoglobin A1C Latest Ref Rng & Units 01/26/2018 10/03/2017  HGBA1C 4.8 - 5.6 % 6.1(H) 7.0(H)  Some recent data might be hidden    She has been working on intensive lifestyle modifications including diet, exercise, and weight loss to help control her blood glucose levels.   ALLERGIES: No Known Allergies  MEDICATIONS: Current Outpatient Medications on File Prior to Visit  Medication Sig Dispense Refill  . ACCU-CHEK AVIVA PLUS test strip TEST TWICE DAILY 200 each 0  . ACCU-CHEK SOFTCLIX LANCETS lancets TEST BLOOD SUGAR TWICE DAILY 100 each 0  . allopurinol (ZYLOPRIM) 100 MG tablet TAKE 1 TABLET EVERY DAY 90 tablet 0  . amLODipine (NORVASC) 10 MG tablet Take 1 tablet (10 mg total) by mouth daily. 90 tablet 1  . aspirin 81 MG tablet Take 81 mg by mouth daily.      Marland Kitchen atorvastatin (LIPITOR) 40 MG tablet TAKE 1 TABLET EVERY DAY 90 tablet 0  . BD INSULIN SYRINGE U/F 31G X 5/16" 0.5 ML MISC  INJECT EVERY DAY 90 each 3  . Blood Glucose Monitoring Suppl (ACCU-CHEK NANO SMARTVIEW) w/Device KIT 1 each by Does not apply route 2 (two) times daily. BID 1 kit 0  . enalapril (VASOTEC) 10 MG tablet TAKE 1 TABLET EVERY DAY 90 tablet 0  . furosemide (LASIX) 20 MG tablet Take 20 mg by mouth daily.     . insulin glargine (LANTUS) 100 UNIT/ML injection INJECT 20 UNITS EVERY NIGHT AT BEDTIME (DISCARD OPEN VIAL 28 DAYS AFTER FIRST OPEN) (Patient taking differently: INJECT 18 UNITS EVERY NIGHT AT BEDTIME (DISCARD OPEN VIAL 28 DAYS AFTER FIRST OPEN)) 30 mL 0  . latanoprost (XALATAN) 0.005 % ophthalmic solution Place 1 drop into both eyes at bedtime.      . pioglitazone (ACTOS) 30 MG tablet TAKE 1 TABLET EVERY DAY 90 tablet 0   No current facility-administered medications on file prior to visit.     PAST MEDICAL HISTORY: Past Medical History:  Diagnosis Date  . Arthritis   . CKD (chronic kidney disease) stage 3, GFR 30-59 ml/min (HCC)    Dr. [name]  . Diabetic retinopathy   . DM retinopathy ([place]) 08/17/12   early  . Dyspnea   . Essential hypertension, benign   . Glaucoma   . Gout   . Pure hypercholesterolemia   . Type II or unspecified type diabetes mellitus without mention of complication, not  stated as uncontrolled     PAST SURGICAL HISTORY: Past Surgical History:  Procedure Laterality Date  . BRAIN SURGERY  1969   Burr holes in skull to alleviate pressure on the brain (no shunt)  . CATARACT EXTRACTION, BILATERAL  2012   Dr. [name]  . COLONOSCOPY  3/05, 11/2013   due again 2020  . COLPOSCOPY N/A 05/27/2015   Procedure: COLPOSCOPY;  Surgeon: [name], MD;  Location: [facility];  Service: Gynecology;  Laterality: N/A;  . LEEP N/A 05/27/2015   Procedure: LOOP ELECTROSURGICAL EXCISION PROCEDURE (LEEP);  Surgeon: [name], MD;  Location: [facility];  Service: Gynecology;  Laterality: N/A;  . TONSILLECTOMY  age 83    SOCIAL HISTORY: Social History   Tobacco Use    . Smoking status: Former Smoker    Last attempt to quit: 06/14/2000    Years since quitting: 17.9  . Smokeless tobacco: Never Used  Substance Use Topics  . Alcohol use: No    Alcohol/week: 0.0 standard drinks  . Drug use: No    FAMILY HISTORY: Family History  Problem Relation Age of Onset  . Hypertension Mother   . Cancer Mother        lung (nonsmoker)  . Stroke Father   . Hypertension Father   . Diabetes Father   . Hypertension Sister   . Diabetes Sister   . Heart disease Sister   . Hypertension Sister   . Breast cancer Maternal Aunt 63       25 of old age  . Diabetes Paternal Grandmother     ROS: Review of Systems  Constitutional: Positive for malaise/fatigue and weight loss.  Gastrointestinal: Negative for nausea and vomiting.  Musculoskeletal:       Negative for muscle weakness  Endo/Heme/Allergies:       Negative for hypoglycemia    PHYSICAL EXAM: Blood pressure 130/75, pulse (!) 124, temperature 98.3 F (36.8 C), temperature source Oral, height 5' 2" (1.575 m), weight 246 lb (111.6 kg), last menstrual period 06/14/1998, SpO2 95 %. Body mass index is 44.99 kg/m. Physical Exam  Constitutional: She is oriented to person, place, and time. She appears well-developed and well-nourished.  Cardiovascular: Normal rate.  Pulmonary/Chest: Effort normal.  Musculoskeletal: Normal range of motion.  Neurological: She is alert and oriented to person, place, and time.  Skin: Skin is warm and dry.  Psychiatric: She has a normal mood and affect. Her behavior is normal.  Vitals reviewed.   RECENT LABS AND TESTS: BMET    Component Value Date/Time   NA 136 01/26/2018 1143   K 4.5 01/26/2018 1143   CL 107 (H) 01/26/2018 1143   CO2 18 (L) 01/26/2018 1143   GLUCOSE 95 01/26/2018 1143   GLUCOSE 139 (H) 05/16/2017 1040   BUN 41 (H) 01/26/2018 1143   CREATININE 1.64 (H) 01/26/2018 1143   CREATININE 1.64 (H) 05/16/2017 1040   CALCIUM 9.1 01/26/2018 1143   GFRNONAA 31 (L)  01/26/2018 1143   GFRAA 35 (L) 01/26/2018 1143   Lab Results  Component Value Date   HGBA1C 6.1 (H) 01/26/2018   HGBA1C 7.0 (H) 10/03/2017   HGBA1C 6.4 05/16/2017   HGBA1C 6.6 07/29/2016   HGBA1C 5.9 03/29/2016   Lab Results  Component Value Date   INSULIN 8.7 10/03/2017   CBC    Component Value Date/Time   WBC 7.0 10/03/2017 1224   WBC 7.4 05/16/2017 1040   RBC 4.42 10/03/2017 1224   RBC 4.51  05/16/2017 1040   HGB 12.5 10/03/2017 1224   HCT 39.4 10/03/2017 1224   PLT 260 05/16/2017 1040   MCV 89 10/03/2017 1224   MCH 28.3 10/03/2017 1224   MCH 27.9 05/16/2017 1040   MCHC 31.7 10/03/2017 1224   MCHC 32.3 05/16/2017 1040   RDW 15.3 10/03/2017 1224   LYMPHSABS 1.7 10/03/2017 1224   MONOABS 558 03/29/2016 1335   EOSABS 0.3 10/03/2017 1224   BASOSABS 0.0 10/03/2017 1224   Iron/TIBC/Ferritin/ %Sat No results found for: IRON, TIBC, FERRITIN, IRONPCTSAT Lipid Panel     Component Value Date/Time   CHOL 163 10/03/2017 1224   TRIG 140 10/03/2017 1224   HDL 42 10/03/2017 1224   CHOLHDL 4.4 05/16/2017 1040   VLDL 24 09/27/2016 1047   LDLCALC 93 10/03/2017 1224   LDLCALC 122 (H) 05/16/2017 1040   Hepatic Function Panel     Component Value Date/Time   PROT 6.6 01/26/2018 1143   ALBUMIN 3.9 01/26/2018 1143   AST 16 01/26/2018 1143   ALT 10 01/26/2018 1143   ALKPHOS 79 01/26/2018 1143   BILITOT 0.3 01/26/2018 1143      Component Value Date/Time   TSH 2.900 10/03/2017 1224   TSH 3.80 05/16/2017 1040   TSH 4.15 09/24/2015 0828   Results for Buzzelli, Stanisha M "MARIE" (MRN [id number]) as of 05/17/2018 07:47  Ref. Range 10/03/2017 12:24  Vitamin D, 25-Hydroxy Latest Ref Range: 30.0 - 100.0 ng/mL 28.3 (L)   ASSESSMENT AND PLAN: Vitamin D deficiency - Plan: Vitamin D, Ergocalciferol, (DRISDOL) 1.25 MG (50000 UT) CAPS capsule  Type 2 diabetes mellitus with stage 3 chronic kidney disease, with long-term current use of insulin (HCC)  Class 3 severe obesity with serious  comorbidity and body mass index (BMI) of 45.0 to 49.9 in adult, unspecified obesity type ([place])  PLAN: Vitamin D Deficiency Lodie was informed that low vitamin D levels contributes to fatigue and are associated with obesity, breast, and colon cancer. She agrees to continue taking prescription Vit D _0 ,000 IU every week #4 with no refills. She will follow up for routine testing of vitamin D at her next appointment. She was informed of the risk of over-replacement of vitamin D and agrees to not increase her dose unless she discusses this with [name] first. Rashida agrees to follow up with our office in 3 weeks.   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 We discussed the importance of good blood sugar control to decrease the likelihood of diabetic complications such as nephropathy, neuropathy, limb loss, blindness, coronary artery disease, and death. We discussed the importance of intensive lifestyle modification including diet, exercise and weight loss as the first line treatment for diabetes. Tamey agrees to continue her diabetes medications. Maryn agrees to follow up with our office in 3 weeks.  Obesity Lorien is currently in the action stage of change. As such, her goal is to continue with weight loss efforts She has agreed to follow the Category 2 plan Kayslee will continue current exercise regimen for weight loss and overall health benefits.  We discussed the following Behavioral Modification Strategies today: work on meal planning and easy cooking plans and holiday eating strategies   Adrienna has agreed to follow up with our clinic in 3 weeks. She was informed of the importance of frequent follow up visits to maximize  her success with intensive lifestyle modifications for her multiple health  conditions.   OBESITY BEHAVIORAL INTERVENTION VISIT  Today's visit was # 12   Starting weight: 286 lbs Starting date: 10/03/2017 Today's weight : Weight: 246 lb (111.6 kg)  Today's date: 05/10/2018 Total lbs lost to date: 40 lbs At least 15 minutes were spent on discussing the following behavioral intervention visit.   ASK: We discussed the diagnosis of obesity with Brita Romp today and Analeah agreed to give [name] permission to discuss obesity behavioral modification therapy today.  ASSESS: Bracha has the diagnosis of obesity and her BMI today is 44.98 Chau is in the action stage of change   ADVISE: Emmalee was educated on the multiple health risks of obesity as well as the benefit of weight loss to improve her health. She was advised of the need for long term treatment and the importance of lifestyle modifications to improve her current health and to decrease her risk of future health problems.  AGREE: Multiple dietary modification options and treatment options were discussed and  Liz agreed to follow the recommendations documented in the above note.  ARRANGE: Marsa was educated on the importance of frequent visits to treat obesity as outlined per CMS and USPSTF guidelines and agreed to schedule her next follow up appointment today.  I, Remi Deter, CMA, am acting as Location manager for [employer], FNP-C.  I have reviewed the above documentation for accuracy and completeness, and I agree with the above.  -  , FNP-C.

## 2018-05-18 ENCOUNTER — Encounter (INDEPENDENT_AMBULATORY_CARE_PROVIDER_SITE_OTHER): Payer: Self-pay | Admitting: Family Medicine

## 2018-05-18 ENCOUNTER — Encounter: Payer: Self-pay | Admitting: Family Medicine

## 2018-05-18 ENCOUNTER — Ambulatory Visit: Payer: Medicare PPO | Admitting: Family Medicine

## 2018-05-18 VITALS — BP 128/68 | HR 80 | Ht 61.5 in | Wt 248.0 lb

## 2018-05-18 DIAGNOSIS — Z6841 Body Mass Index (BMI) 40.0 and over, adult: Secondary | ICD-10-CM

## 2018-05-18 DIAGNOSIS — E1122 Type 2 diabetes mellitus with diabetic chronic kidney disease: Secondary | ICD-10-CM | POA: Diagnosis not present

## 2018-05-18 DIAGNOSIS — N183 Chronic kidney disease, stage 3 (moderate): Secondary | ICD-10-CM

## 2018-05-18 DIAGNOSIS — M109 Gout, unspecified: Secondary | ICD-10-CM | POA: Diagnosis not present

## 2018-05-18 DIAGNOSIS — E559 Vitamin D deficiency, unspecified: Secondary | ICD-10-CM

## 2018-05-18 DIAGNOSIS — Z5181 Encounter for therapeutic drug level monitoring: Secondary | ICD-10-CM

## 2018-05-18 DIAGNOSIS — I10 Essential (primary) hypertension: Secondary | ICD-10-CM | POA: Diagnosis not present

## 2018-05-18 DIAGNOSIS — E782 Mixed hyperlipidemia: Secondary | ICD-10-CM

## 2018-05-18 DIAGNOSIS — Z0000 Encounter for general adult medical examination without abnormal findings: Secondary | ICD-10-CM | POA: Diagnosis not present

## 2018-05-18 DIAGNOSIS — Z794 Long term (current) use of insulin: Secondary | ICD-10-CM | POA: Diagnosis not present

## 2018-05-18 LAB — POCT URINALYSIS DIP (PROADVANTAGE DEVICE)
BILIRUBIN UA: NEGATIVE
BILIRUBIN UA: NEGATIVE mg/dL
Glucose, UA: NEGATIVE mg/dL
Leukocytes, UA: NEGATIVE
NITRITE UA: NEGATIVE
PH UA: 5.5 (ref 5.0–8.0)
PROTEIN UA: NEGATIVE mg/dL
RBC UA: NEGATIVE
Specific Gravity, Urine: 1.02
Urobilinogen, Ur: NEGATIVE

## 2018-05-18 LAB — POCT GLYCOSYLATED HEMOGLOBIN (HGB A1C): Hemoglobin A1C: 5.5 % (ref 4.0–5.6)

## 2018-05-18 NOTE — Addendum Note (Signed)
Addended by: Debbrah AlarSMITH, Bralyn Espino F on: 05/18/2018 10:20 AM   Modules accepted: Orders

## 2018-05-19 LAB — URIC ACID: Uric Acid: 6.3 mg/dL (ref 2.5–7.1)

## 2018-05-19 LAB — LIPID PANEL
CHOL/HDL RATIO: 4.3 ratio (ref 0.0–4.4)
CHOLESTEROL TOTAL: 170 mg/dL (ref 100–199)
HDL: 40 mg/dL (ref >39)
LDL CALC: 104 mg/dL — AB (ref 0–99)
TRIGLYCERIDES: 128 mg/dL (ref 0–149)
VLDL Cholesterol Cal: 26 mg/dL (ref 5–40)

## 2018-05-24 ENCOUNTER — Other Ambulatory Visit: Payer: Self-pay | Admitting: Family Medicine

## 2018-05-24 DIAGNOSIS — N183 Chronic kidney disease, stage 3 (moderate): Principal | ICD-10-CM

## 2018-05-24 DIAGNOSIS — E1122 Type 2 diabetes mellitus with diabetic chronic kidney disease: Secondary | ICD-10-CM

## 2018-05-24 DIAGNOSIS — I10 Essential (primary) hypertension: Secondary | ICD-10-CM

## 2018-05-31 ENCOUNTER — Encounter (INDEPENDENT_AMBULATORY_CARE_PROVIDER_SITE_OTHER): Payer: Self-pay

## 2018-05-31 ENCOUNTER — Ambulatory Visit (INDEPENDENT_AMBULATORY_CARE_PROVIDER_SITE_OTHER): Payer: Medicare PPO | Admitting: Family Medicine

## 2018-06-02 ENCOUNTER — Other Ambulatory Visit: Payer: Self-pay | Admitting: Family Medicine

## 2018-06-12 ENCOUNTER — Encounter: Payer: Self-pay | Admitting: *Deleted

## 2018-06-20 ENCOUNTER — Encounter (INDEPENDENT_AMBULATORY_CARE_PROVIDER_SITE_OTHER): Payer: Self-pay | Admitting: Family Medicine

## 2018-06-20 ENCOUNTER — Ambulatory Visit (INDEPENDENT_AMBULATORY_CARE_PROVIDER_SITE_OTHER): Payer: Medicare PPO | Admitting: Family Medicine

## 2018-06-20 VITALS — BP 145/78 | HR 82 | Temp 97.7°F | Ht 62.0 in | Wt 238.0 lb

## 2018-06-20 DIAGNOSIS — E1121 Type 2 diabetes mellitus with diabetic nephropathy: Secondary | ICD-10-CM

## 2018-06-20 DIAGNOSIS — Z794 Long term (current) use of insulin: Secondary | ICD-10-CM | POA: Diagnosis not present

## 2018-06-20 DIAGNOSIS — E559 Vitamin D deficiency, unspecified: Secondary | ICD-10-CM

## 2018-06-20 DIAGNOSIS — Z6841 Body Mass Index (BMI) 40.0 and over, adult: Secondary | ICD-10-CM | POA: Diagnosis not present

## 2018-06-20 MED ORDER — VITAMIN D (ERGOCALCIFEROL) 1.25 MG (50000 UNIT) PO CAPS: 50000.0000 [IU] | ORAL_CAPSULE | ORAL | 0 refills | Status: DC

## 2018-06-21 ENCOUNTER — Encounter (INDEPENDENT_AMBULATORY_CARE_PROVIDER_SITE_OTHER): Payer: Self-pay | Admitting: Family Medicine

## 2018-06-21 DIAGNOSIS — E559 Vitamin D deficiency, unspecified: Secondary | ICD-10-CM | POA: Insufficient documentation

## 2018-06-21 DIAGNOSIS — Z6841 Body Mass Index (BMI) 40.0 and over, adult: Secondary | ICD-10-CM

## 2018-06-21 LAB — COMPREHENSIVE METABOLIC PANEL
ALT: 10 IU/L (ref 0–32)
AST: 18 IU/L (ref 0–40)
Albumin/Globulin Ratio: 1.6 (ref 1.2–2.2)
Albumin: 4.3 g/dL (ref 3.5–4.8)
Alkaline Phosphatase: 86 IU/L (ref 39–117)
BUN/Creatinine Ratio: 23 (ref 12–28)
BUN: 32 mg/dL — ABNORMAL HIGH (ref 8–27)
Bilirubin Total: 0.4 mg/dL (ref 0.0–1.2)
CO2: 19 mmol/L — ABNORMAL LOW (ref 20–29)
Calcium: 9.8 mg/dL (ref 8.7–10.3)
Chloride: 101 mmol/L (ref 96–106)
Creatinine, Ser: 1.41 mg/dL — ABNORMAL HIGH (ref 0.57–1.00)
GFR calc Af Amer: 42 mL/min/{1.73_m2} — ABNORMAL LOW (ref >59)
GFR calc non Af Amer: 36 mL/min/{1.73_m2} — ABNORMAL LOW (ref >59)
Globulin, Total: 2.7 g/dL (ref 1.5–4.5)
Glucose: 88 mg/dL (ref 65–99)
Potassium: 4.6 mmol/L (ref 3.5–5.2)
Sodium: 138 mmol/L (ref 134–144)
Total Protein: 7 g/dL (ref 6.0–8.5)

## 2018-06-21 LAB — VITAMIN D 25 HYDROXY (VIT D DEFICIENCY, FRACTURES): Vit D, 25-Hydroxy: 34.9 ng/mL (ref 30.0–100.0)

## 2018-06-21 NOTE — Progress Notes (Signed)
Office: [phone]  /  Fax: [phone]   HPI:   Chief Complaint: OBESITY Kerri Richardson is here to discuss her progress with her obesity treatment plan. She is on the Category 2 plan and is following her eating plan approximately 80 % of the time. She states she is exercising 0 minutes 0 times per week. Kerri Richardson denies hunger and is eating all the food on the plan.  Her weight is 238 lb (108 kg) today and has had a weight loss of 8 pounds over a period of 6 weeks since her last visit. She has lost 48 lbs since starting treatment with [name].  Vitamin D deficiency Kerri Richardson has a diagnosis of vitamin D deficiency. She is currently taking vit D. Her last vitamin D level was 27.4 on 01/26/18. She denies nausea, vomiting, or muscle weakness.  Diabetes II with Nephropathy and Insulin Use Kerri Richardson has a diagnosis of diabetes type II. Kerri Richardson states that her fasting BGs range between 80 and 100 and her 3 hour post prandial sugars are around 100. She is on Lantus 20 units and Actos 22m and is well controlled. Her last A1c was 5.5 on 05/18/18. She denies polyphagia or any hypoglycemic episodes. She has been working on intensive lifestyle modifications including diet, exercise, and weight loss to help control her blood glucose levels.  ASSESSMENT AND PLAN:  Vitamin D deficiency - Plan: VITAMIN D 25 Hydroxy (Vit-D Deficiency, Fractures), Vitamin D, Ergocalciferol, (DRISDOL) 1.25 MG (50000 UT) CAPS capsule  Type 2 diabetes mellitus with diabetic nephropathy, with long-term current use of insulin (HCC) - Plan: Comprehensive metabolic panel  Class 3 severe obesity with serious comorbidity and body mass index (BMI) of 40.0 to 44.9 in adult, unspecified obesity type ([address]  PLAN:  Vitamin D Deficiency ECaitlynnwas Richardson that low vitamin D levels contributes to fatigue and are associated with obesity, breast, and colon cancer. She agrees to continue to take prescription Vit D _0 ,000 IU every week #4 with no refills and will  follow up for routine testing of vitamin D, at least 2-3 times per year. She was Richardson of the risk of over-replacement of vitamin D and agrees to not increase her dose unless she discusses this with [place]. We will check a vitamin D level today. Kerri Richardson to follow up in 3 weeks.  Diabetes II with Nephropathy and Insulin Use [name] extensive diabetes education by myself today including ideal fasting and post-prandial blood glucose readings, individual ideal Hgb A1c goals, and hypoglycemia prevention. We discussed the importance of good blood sugar control to decrease the likelihood of diabetic complications such as nephropathy, neuropathy, limb loss, blindness, coronary artery disease, and death. We discussed the importance of intensive lifestyle modification including diet, exercise and weight loss as the first line treatment for diabetes. We will check a CMET today. She had a recent A1c. Kerri Richardson to continue her meal plan and diabetes medications and will follow up at the agreed upon time.  Obesity [name] currently in the action stage of change. As such, her goal is to continue with weight loss efforts. She has agreed to follow the Category 2 plan. We discussed the following Behavioral Modification Strategies today: planning for success. Kerri Richardson not been prescribed exercise at this time. [name] agreed to follow up with our clinic in 3 weeks. She was Richardson of the importance of frequent follow up visits to maximize her success with intensive lifestyle modifications for her multiple health conditions.  ALLERGIES:  No Known Allergies  MEDICATIONS: Current Outpatient Medications on File Prior to Visit  Medication Sig Dispense Refill  . ACCU-CHEK AVIVA PLUS test strip TEST BLOOD SUGAR TWICE DAILY 200 each 0  . ACCU-CHEK SOFTCLIX LANCETS lancets TEST BLOOD SUGAR TWICE DAILY 100 each 1  . allopurinol (ZYLOPRIM) 100 MG tablet TAKE 1 TABLET EVERY DAY 90 tablet 0  .  amLODipine (NORVASC) 10 MG tablet Take 1 tablet (10 mg total) by mouth daily. 90 tablet 1  . aspirin 81 MG tablet Take 81 mg by mouth daily.      Marland Kitchen atorvastatin (LIPITOR) 40 MG tablet TAKE 1 TABLET EVERY DAY 90 tablet 0  . BD INSULIN SYRINGE U/F 31G X 5/16" 0.5 ML MISC INJECT EVERY DAY 90 each 3  . Blood Glucose Monitoring Suppl (ACCU-CHEK NANO SMARTVIEW) w/Device KIT 1 each by Does not apply route 2 (two) times daily. BID 1 kit 0  . enalapril (VASOTEC) 10 MG tablet TAKE 1 TABLET EVERY DAY 90 tablet 0  . furosemide (LASIX) 20 MG tablet Take 20 mg by mouth daily.     . insulin glargine (LANTUS) 100 UNIT/ML injection INJECT 20 UNITS EVERY NIGHT AT BEDTIME (DISCARD OPEN VIAL 28 DAYS AFTER FIRST OPEN) (Patient taking differently: INJECT 18 UNITS EVERY NIGHT AT BEDTIME (DISCARD OPEN VIAL 28 DAYS AFTER FIRST OPEN)) 30 mL 0  . latanoprost (XALATAN) 0.005 % ophthalmic solution Place 1 drop into both eyes at bedtime.      . pioglitazone (ACTOS) 30 MG tablet TAKE 1 TABLET EVERY DAY 90 tablet 0   No current facility-administered medications on file prior to visit.     PAST MEDICAL HISTORY: Past Medical History:  Diagnosis Date  . Arthritis   . CKD (chronic kidney disease) stage 3, GFR 30-59 ml/min (HCC)    Dr. [name]  . Diabetic retinopathy   . DM retinopathy ([place]) 08/17/12   early  . Dyspnea   . Essential hypertension, benign   . Glaucoma   . Gout   . Pure hypercholesterolemia   . Type II or unspecified type diabetes mellitus without mention of complication, not stated as uncontrolled     PAST SURGICAL HISTORY: Past Surgical History:  Procedure Laterality Date  . BRAIN SURGERY  1969   Burr holes in skull to alleviate pressure on the brain (no shunt)  . CATARACT EXTRACTION, BILATERAL  2012   Dr. [name]  . COLONOSCOPY  3/05, 11/2013   due again 2020  . COLPOSCOPY N/A 05/27/2015   Procedure: COLPOSCOPY;  Surgeon: [name], MD;  Location: [facility];  Service: Gynecology;   Laterality: N/A;  . LEEP N/A 05/27/2015   Procedure: LOOP ELECTROSURGICAL EXCISION PROCEDURE (LEEP);  Surgeon: [name], MD;  Location: [facility];  Service: Gynecology;  Laterality: N/A;  . TONSILLECTOMY  age 58    SOCIAL HISTORY: Social History   Tobacco Use  . Smoking status: Former Smoker    Last attempt to quit: 06/14/2000    Years since quitting: 18.0  . Smokeless tobacco: Never Used  Substance Use Topics  . Alcohol use: No    Alcohol/week: 0.0 standard drinks  . Drug use: No    FAMILY HISTORY: Family History  Problem Relation Age of Onset  . Hypertension Mother   . Cancer Mother        lung (nonsmoker)  . Stroke Father   . Hypertension Father   . Diabetes Father   . Hypertension Sister   . Diabetes  Sister   . Heart disease Sister   . Hypertension Sister   . Breast cancer Maternal Aunt 77       83 of old age  . Diabetes Paternal Grandmother     ROS: Review of Systems  Constitutional: Positive for weight loss.  Gastrointestinal: Negative for nausea and vomiting.  Musculoskeletal:       Negative for muscle weakness.  Endo/Heme/Allergies:       Negative for polyphagia. Negative for hypoglycemia.    PHYSICAL EXAM: Blood pressure (!) 145/78, pulse 82, temperature 97.7 F (36.5 C), temperature source Oral, height 5' 2" (1.575 m), weight 238 lb (108 kg), last menstrual period 06/14/1998, SpO2 95 %. Body mass index is 43.53 kg/m. Physical Exam Vitals signs reviewed.  Constitutional:      Appearance: Normal appearance. She is obese.  Cardiovascular:     Rate and Rhythm: Normal rate.  Pulmonary:     Effort: Pulmonary effort is normal.  Musculoskeletal: Normal range of motion.  Skin:    General: Skin is warm and dry.  Neurological:     Mental Status: She is alert and oriented to person, place, and time.  Psychiatric:        Mood and Affect: Mood normal.        Behavior: Behavior normal.     RECENT LABS AND TESTS: BMET    Component Value  Date/Time   NA 136 01/26/2018 1143   K 4.5 01/26/2018 1143   CL 107 (H) 01/26/2018 1143   CO2 18 (L) 01/26/2018 1143   GLUCOSE 95 01/26/2018 1143   GLUCOSE 139 (H) 05/16/2017 1040   BUN 41 (H) 01/26/2018 1143   CREATININE 1.64 (H) 01/26/2018 1143   CREATININE 1.64 (H) 05/16/2017 1040   CALCIUM 9.1 01/26/2018 1143   GFRNONAA 31 (L) 01/26/2018 1143   GFRAA 35 (L) 01/26/2018 1143   Lab Results  Component Value Date   HGBA1C 5.5 05/18/2018   HGBA1C 6.1 (H) 01/26/2018   HGBA1C 7.0 (H) 10/03/2017   HGBA1C 6.4 05/16/2017   HGBA1C 6.6 07/29/2016   Lab Results  Component Value Date   INSULIN 8.7 10/03/2017   CBC    Component Value Date/Time   WBC 7.0 10/03/2017 1224   WBC 7.4 05/16/2017 1040   RBC 4.42 10/03/2017 1224   RBC 4.51 05/16/2017 1040   HGB 12.5 10/03/2017 1224   HCT 39.4 10/03/2017 1224   PLT 260 05/16/2017 1040   MCV 89 10/03/2017 1224   MCH 28.3 10/03/2017 1224   MCH 27.9 05/16/2017 1040   MCHC 31.7 10/03/2017 1224   MCHC 32.3 05/16/2017 1040   RDW 15.3 10/03/2017 1224   LYMPHSABS 1.7 10/03/2017 1224   MONOABS 558 03/29/2016 1335   EOSABS 0.3 10/03/2017 1224   BASOSABS 0.0 10/03/2017 1224   Iron/TIBC/Ferritin/ %Sat No results found for: IRON, TIBC, FERRITIN, IRONPCTSAT Lipid Panel     Component Value Date/Time   CHOL 170 05/18/2018 1000   TRIG 128 05/18/2018 1000   HDL 40 05/18/2018 1000   CHOLHDL 4.3 05/18/2018 1000   CHOLHDL 4.4 05/16/2017 1040   VLDL 24 09/27/2016 1047   LDLCALC 104 (H) 05/18/2018 1000   LDLCALC 122 (H) 05/16/2017 1040   Hepatic Function Panel     Component Value Date/Time   PROT 6.6 01/26/2018 1143   ALBUMIN 3.9 01/26/2018 1143   AST 16 01/26/2018 1143   ALT 10 01/26/2018 1143   ALKPHOS 79 01/26/2018 1143   BILITOT 0.3 01/26/2018 1143  Component Value Date/Time   TSH 2.900 10/03/2017 1224   TSH 3.80 05/16/2017 1040   TSH 4.15 09/24/2015 0828   Results for HAMPSHIRE, Taygan M "MARIE" (MRN [id number]) as of 06/21/2018  07:14  Ref. Range 01/26/2018 11:43  Vitamin D, 25-Hydroxy Latest Ref Range: 30.0 - 100.0 ng/mL 27.4 (L)    OBESITY BEHAVIORAL INTERVENTION VISIT  Today's visit was # 13   Starting weight: 286 lbs Starting date: 10/03/17 Today's weight : Weight: 238 lb (108 kg)  Today's date: 06/20/2018 Total lbs lost to date: 48 At least 15 minutes were spent on discussing the following behavioral intervention visit.  ASK: We discussed the diagnosis of obesity with Kerri Richardson today and Kerri Richardson agreed to give [name] permission to discuss obesity behavioral modification therapy today.  ASSESS: Lineth has the diagnosis of obesity and her BMI today is 43.5. Meliza is in the action stage of change.   ADVISE: Fujiko was educated on the multiple health risks of obesity as well as the benefit of weight loss to improve her health. She was advised of the need for long term treatment and the importance of lifestyle modifications to improve her current health and to decrease her risk of future health problems.  AGREE: Multiple dietary modification options and treatment options were discussed and Ozelle agreed to follow the recommendations documented in the above note.  ARRANGE: Valta was educated on the importance of frequent visits to treat obesity as outlined per CMS and USPSTF guidelines and agreed to schedule her next follow up appointment today.  I, Marcille Blanco, am acting as Location manager for [employer], FNP-C.  I have reviewed the above documentation for accuracy and completeness, and I agree with the above.  - Myangel Summons, FNP-C.

## 2018-06-28 ENCOUNTER — Other Ambulatory Visit: Payer: Self-pay | Admitting: Family Medicine

## 2018-06-28 DIAGNOSIS — E1122 Type 2 diabetes mellitus with diabetic chronic kidney disease: Secondary | ICD-10-CM

## 2018-06-28 DIAGNOSIS — N183 Chronic kidney disease, stage 3 unspecified: Secondary | ICD-10-CM

## 2018-06-28 DIAGNOSIS — M109 Gout, unspecified: Secondary | ICD-10-CM

## 2018-07-11 ENCOUNTER — Ambulatory Visit (INDEPENDENT_AMBULATORY_CARE_PROVIDER_SITE_OTHER): Payer: Medicare PPO | Admitting: Family Medicine

## 2018-07-11 ENCOUNTER — Encounter (INDEPENDENT_AMBULATORY_CARE_PROVIDER_SITE_OTHER): Payer: Self-pay | Admitting: Family Medicine

## 2018-07-11 VITALS — BP 124/77 | HR 78 | Temp 97.8°F | Ht 62.0 in | Wt 238.0 lb

## 2018-07-11 DIAGNOSIS — E1122 Type 2 diabetes mellitus with diabetic chronic kidney disease: Secondary | ICD-10-CM

## 2018-07-11 DIAGNOSIS — E559 Vitamin D deficiency, unspecified: Secondary | ICD-10-CM

## 2018-07-11 DIAGNOSIS — Z6841 Body Mass Index (BMI) 40.0 and over, adult: Secondary | ICD-10-CM | POA: Diagnosis not present

## 2018-07-11 DIAGNOSIS — Z794 Long term (current) use of insulin: Secondary | ICD-10-CM | POA: Diagnosis not present

## 2018-07-11 DIAGNOSIS — N183 Chronic kidney disease, stage 3 (moderate): Secondary | ICD-10-CM

## 2018-07-11 MED ORDER — VITAMIN D (ERGOCALCIFEROL) 1.25 MG (50000 UNIT) PO CAPS: 50000.0000 [IU] | ORAL_CAPSULE | ORAL | 0 refills | Status: DC

## 2018-07-12 NOTE — Progress Notes (Signed)
Office: [phone]  /  Fax: [phone]   HPI:   Chief Complaint: OBESITY Kerri Richardson is here to discuss her progress with her obesity treatment plan. She is on the Category 2 plan and is following her eating plan approximately 90 % of the time. She states she is walking 10 to 15 minutes 2 times per week. Kerri Richardson has been fasting at church and was not eating meat. She is now back on her plan.  Her weight is 238 lb (108 kg) today and has not lost weight since her last visit. She has lost 48 lbs since starting treatment with [name].  Diabetes II Kerri Richardson has a diagnosis of diabetes type II. Kerri Richardson states that her fasting blood sugars range between 74 and 124 and her 2 hour post prandial sugars are 124 to 130. She denies polyphagia or any hypoglycemic episodes. Last A1c was 5.5 on 05/18/18. Her diabetes is well controlled with Lantus 18 units and Actos 2m. She has been working on intensive lifestyle modifications including diet, exercise, and weight loss to help control her blood glucose levels.  Vitamin D deficiency Kerri Richardson a diagnosis of vitamin D deficiency. She is currently taking vit D and is not at goal. Her last vitamin D level was 34.9 on 06/20/18 despite taking RX vitamin D for 3 months..Vitamin D was 27.4 on 01/26/18.  She denies nausea, vomiting, or muscle weakness.  ASSESSMENT AND PLAN:  Type 2 diabetes mellitus with stage 3 chronic kidney disease, with long-term current use of insulin (HCC)  Vitamin D deficiency - Plan: Vitamin D, Ergocalciferol, (DRISDOL) 1.25 MG (50000 UT) CAPS capsule  Class 3 severe obesity with serious comorbidity and body mass index (BMI) of 40.0 to 44.9 in adult, unspecified obesity type ([place]  PLAN:  Diabetes II Kerri Richardson been given extensive diabetes education by myself today including ideal fasting and post-prandial blood glucose readings, individual ideal Hgb A1c goals, and hypoglycemia prevention. We discussed the importance of good blood sugar control to decrease  the likelihood of diabetic complications such as nephropathy, neuropathy, limb loss, blindness, coronary artery disease, and death. We discussed the importance of intensive lifestyle modification including diet, exercise and weight loss as the first line treatment for diabetes. EMaysunagrees to continue her Lantus,  Actos, and her diet plan and will follow up at the agreed upon time in 3 weeks.  Vitamin D Deficiency Kerri Richardson informed that low vitamin D levels contributes to fatigue and are associated with obesity, breast, and colon cancer. She agrees to increase her dose of prescription Vit D '@50' ,000 IU every 3 days #10 with no refills and will follow up for routine testing of vitamin D, at least 2-3 times per year. She was informed of the risk of over-replacement of vitamin D and agrees to not increase her dose unless she discusses this with [place]. Kerri Richardson to follow up as directed.  Obesity Kerri Richardson currently in the action stage of change. As such, her goal is to continue with weight loss efforts. She has agreed to follow the Category 2 plan. Kerri Richardson been instructed to continue walking and use hand weights twice weekly. We discussed the following Behavioral Modification Strategies today: increase H2O intake, work on meal planning and easy cooking plans, and planning for success.  Kerri Richardson agreed to follow up with our clinic in 3 weeks. She was informed of the importance of frequent follow up visits to maximize her success with intensive lifestyle modifications for her multiple health conditions.  ALLERGIES: No Known Allergies  MEDICATIONS: Current Outpatient Medications on File Prior to Visit  Medication Sig Dispense Refill  . ACCU-CHEK AVIVA PLUS test strip TEST BLOOD SUGAR TWICE DAILY 200 each 0  . ACCU-CHEK SOFTCLIX LANCETS lancets TEST BLOOD SUGAR TWICE DAILY 100 each 1  . allopurinol (ZYLOPRIM) 100 MG tablet TAKE 1 TABLET EVERY DAY 90 tablet 0  . amLODipine (NORVASC) 10 MG tablet  Take 1 tablet (10 mg total) by mouth daily. 90 tablet 1  . aspirin 81 MG tablet Take 81 mg by mouth daily.      Marland Kitchen atorvastatin (LIPITOR) 40 MG tablet TAKE 1 TABLET EVERY DAY 90 tablet 0  . BD INSULIN SYRINGE U/F 31G X 5/16" 0.5 ML MISC INJECT EVERY DAY 90 each 3  . Blood Glucose Monitoring Suppl (ACCU-CHEK NANO SMARTVIEW) w/Device KIT 1 each by Does not apply route 2 (two) times daily. BID 1 kit 0  . enalapril (VASOTEC) 10 MG tablet TAKE 1 TABLET EVERY DAY 90 tablet 0  . furosemide (LASIX) 20 MG tablet Take 20 mg by mouth daily.     . insulin glargine (LANTUS) 100 UNIT/ML injection INJECT 20 UNITS EVERY NIGHT AT BEDTIME (DISCARD OPEN VIAL 28 DAYS AFTER FIRST OPEN) (Patient taking differently: INJECT 18 UNITS EVERY NIGHT AT BEDTIME (DISCARD OPEN VIAL 28 DAYS AFTER FIRST OPEN)) 30 mL 0  . latanoprost (XALATAN) 0.005 % ophthalmic solution Place 1 drop into both eyes at bedtime.      . pioglitazone (ACTOS) 30 MG tablet TAKE 1 TABLET EVERY DAY 90 tablet 0   No current facility-administered medications on file prior to visit.     PAST MEDICAL HISTORY: Past Medical History:  Diagnosis Date  . Arthritis   . CKD (chronic kidney disease) stage 3, GFR 30-59 ml/min (HCC)    Dr. [name]  . Diabetic retinopathy   . DM retinopathy ([place]) 08/17/12   early  . Dyspnea   . Essential hypertension, benign   . Glaucoma   . Gout   . Pure hypercholesterolemia   . Type II or unspecified type diabetes mellitus without mention of complication, not stated as uncontrolled     PAST SURGICAL HISTORY: Past Surgical History:  Procedure Laterality Date  . BRAIN SURGERY  1969   Burr holes in skull to alleviate pressure on the brain (no shunt)  . CATARACT EXTRACTION, BILATERAL  2012   Dr. [name]  . COLONOSCOPY  3/05, 11/2013   due again 2020  . COLPOSCOPY N/A 05/27/2015   Procedure: COLPOSCOPY;  Surgeon: [name], MD;  Location: [facility];  Service: Gynecology;  Laterality: N/A;  . LEEP N/A 05/27/2015     Procedure: LOOP ELECTROSURGICAL EXCISION PROCEDURE (LEEP);  Surgeon: [name], MD;  Location: [facility];  Service: Gynecology;  Laterality: N/A;  . TONSILLECTOMY  age 61    SOCIAL HISTORY: Social History   Tobacco Use  . Smoking status: Former Smoker    Last attempt to quit: 06/14/2000    Years since quitting: 18.0  . Smokeless tobacco: Never Used  Substance Use Topics  . Alcohol use: No    Alcohol/week: 0.0 standard drinks  . Drug use: No    FAMILY HISTORY: Family History  Problem Relation Age of Onset  . Hypertension Mother   . Cancer Mother        lung (nonsmoker)  . Stroke Father   . Hypertension Father   . Diabetes Father   . Hypertension Sister   .  Diabetes Sister   . Heart disease Sister   . Hypertension Sister   . Breast cancer Maternal Aunt 61       70 of old age  . Diabetes Paternal Grandmother     ROS: Review of Systems  Constitutional: Negative for weight loss.  Gastrointestinal: Negative for nausea and vomiting.  Musculoskeletal:       Negative for muscle weakness.  Endo/Heme/Allergies:       Negative for polyphagia. Negative for hypoglycemia.    PHYSICAL EXAM: Blood pressure 124/77, pulse 78, temperature 97.8 F (36.6 C), temperature source Oral, height '5\' 2"'  (1.575 m), weight 238 lb (108 kg), last menstrual period 06/14/1998, SpO2 97 %. Body mass index is 43.53 kg/m. Physical Exam Vitals signs reviewed.  Constitutional:      Appearance: Normal appearance. She is obese.  Cardiovascular:     Rate and Rhythm: Normal rate.  Pulmonary:     Effort: Pulmonary effort is normal.  Musculoskeletal: Normal range of motion.     Comments: Uses a cane for ambulation.  Skin:    General: Skin is warm and dry.  Neurological:     Mental Status: She is alert and oriented to person, place, and time.  Psychiatric:        Mood and Affect: Mood normal.        Behavior: Behavior normal.     RECENT LABS AND TESTS: BMET    Component Value  Date/Time   NA 138 06/20/2018 1300   K 4.6 06/20/2018 1300   CL 101 06/20/2018 1300   CO2 19 (L) 06/20/2018 1300   GLUCOSE 88 06/20/2018 1300   GLUCOSE 139 (H) 05/16/2017 1040   BUN 32 (H) 06/20/2018 1300   CREATININE 1.41 (H) 06/20/2018 1300   CREATININE 1.64 (H) 05/16/2017 1040   CALCIUM 9.8 06/20/2018 1300   GFRNONAA 36 (L) 06/20/2018 1300   GFRAA 42 (L) 06/20/2018 1300   Lab Results  Component Value Date   HGBA1C 5.5 05/18/2018   HGBA1C 6.1 (H) 01/26/2018   HGBA1C 7.0 (H) 10/03/2017   HGBA1C 6.4 05/16/2017   HGBA1C 6.6 07/29/2016   Lab Results  Component Value Date   INSULIN 8.7 10/03/2017   CBC    Component Value Date/Time   WBC 7.0 10/03/2017 1224   WBC 7.4 05/16/2017 1040   RBC 4.42 10/03/2017 1224   RBC 4.51 05/16/2017 1040   HGB 12.5 10/03/2017 1224   HCT 39.4 10/03/2017 1224   PLT 260 05/16/2017 1040   MCV 89 10/03/2017 1224   MCH 28.3 10/03/2017 1224   MCH 27.9 05/16/2017 1040   MCHC 31.7 10/03/2017 1224   MCHC 32.3 05/16/2017 1040   RDW 15.3 10/03/2017 1224   LYMPHSABS 1.7 10/03/2017 1224   MONOABS 558 03/29/2016 1335   EOSABS 0.3 10/03/2017 1224   BASOSABS 0.0 10/03/2017 1224   Iron/TIBC/Ferritin/ %Sat No results found for: IRON, TIBC, FERRITIN, IRONPCTSAT Lipid Panel     Component Value Date/Time   CHOL 170 05/18/2018 1000   TRIG 128 05/18/2018 1000   HDL 40 05/18/2018 1000   CHOLHDL 4.3 05/18/2018 1000   CHOLHDL 4.4 05/16/2017 1040   VLDL 24 09/27/2016 1047   LDLCALC 104 (H) 05/18/2018 1000   LDLCALC 122 (H) 05/16/2017 1040   Hepatic Function Panel     Component Value Date/Time   PROT 7.0 06/20/2018 1300   ALBUMIN 4.3 06/20/2018 1300   AST 18 06/20/2018 1300   ALT 10 06/20/2018 1300   ALKPHOS 86 06/20/2018 1300  BILITOT 0.4 06/20/2018 1300      Component Value Date/Time   TSH 2.900 10/03/2017 1224   TSH 3.80 05/16/2017 1040   TSH 4.15 09/24/2015 0828   Results for Choquette, Kerri M "MARIE" (MRN [id number]) as of 07/12/2018  14:19  Ref. Range 06/20/2018 13:00  Vitamin D, 25-Hydroxy Latest Ref Range: 30.0 - 100.0 ng/mL 34.9   OBESITY BEHAVIORAL INTERVENTION VISIT  Today's visit was # 14   Starting weight: 286 lbs Starting date: 10/03/17 Today's weight : Weight: 238 lb (108 kg)  Today's date: 07/11/2018 Total lbs lost to date: 48 At least 15 minutes were spent on discussing the following behavioral intervention visit.  ASK: We discussed the diagnosis of obesity with Kerri Richardson today and Kerri Richardson agreed to give [name] permission to discuss obesity behavioral modification therapy today.  ASSESS: Whittley has the diagnosis of obesity and her BMI today is 43.5. Neeley is in the action stage of change.   ADVISE: Rafaella was educated on the multiple health risks of obesity as well as the benefit of weight loss to improve her health. She was advised of the need for long term treatment and the importance of lifestyle modifications to improve her current health and to decrease her risk of future health problems.  AGREE: Multiple dietary modification options and treatment options were discussed and Cesily agreed to follow the recommendations documented in the above note.  ARRANGE: Daniele was educated on the importance of frequent visits to treat obesity as outlined per CMS and USPSTF guidelines and agreed to schedule her next follow up appointment today.  I, Marcille Blanco, am acting as Location manager for [employer], FNP-C.,  I have reviewed the above documentation for accuracy and completeness, and I agree with the above.  - Reianna Batdorf, FNP-C.

## 2018-07-20 NOTE — Progress Notes (Signed)
78 y.o. G0P0000 Single African American female here for annual exam.    Denies vaginal bleeding or spotting.  No vaginal discharge.  No pain.   PCP:  Rita Ohara, MD  Patient's last menstrual period was 06/14/1998 (approximate).           Sexually active: No.  The current method of family planning is post menopausal status.    Exercising: No.  The patient does not participate in regular exercise at present. Smoker:  no  Health Maintenance: Pap: 07-15-17 Neg:Neg HR HPV, 07-14-16 Neg:Neg HR HPV History of abnormal Pap:  Yes,  02-24-15 Neg:Pos HR HPV and Pos 18/45; colposcopy 04-25-15 unsatisfactory. 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  She will call and schedule.  Colonoscopy:  11-26-13 polyp with Dr.Mann;next due 11/2018 BMD: 02-10-10 Result : Normal TDaP: 06-12-18 Gardasil:   no HIV:not indicated Hep C: not indicated Screening Labs:  Hb today: PCP   reports that she quit smoking about 18 years ago. She has never used smokeless tobacco. She reports that she does not drink alcohol or use drugs.  Past Medical History:  Diagnosis Date  . Arthritis   . CKD (chronic kidney disease) stage 3, GFR 30-59 ml/min (HCC)    Dr. [name]  . Diabetic retinopathy   . DM retinopathy ([place]) 08/17/12   early  . Dyspnea   . Essential hypertension, benign   . Glaucoma   . Gout   . Pure hypercholesterolemia   . Type II or unspecified type diabetes mellitus without mention of complication, not stated as uncontrolled     Past Surgical History:  Procedure Laterality Date  . BRAIN SURGERY  1969   Burr holes in skull to alleviate pressure on the brain (no shunt)  . CATARACT EXTRACTION, BILATERAL  2012   Dr. [name]  . COLONOSCOPY  3/05, 11/2013   due again 2020  . COLPOSCOPY N/A 05/27/2015   Procedure: COLPOSCOPY;  Surgeon: [name], MD;  Location: [facility];  Service: Gynecology;  Laterality: N/A;  . LEEP N/A  05/27/2015   Procedure: LOOP ELECTROSURGICAL EXCISION PROCEDURE (LEEP);  Surgeon: [name], MD;  Location: [facility];  Service: Gynecology;  Laterality: N/A;  . TONSILLECTOMY  age 58    Current Outpatient Medications  Medication Sig Dispense Refill  . ACCU-CHEK AVIVA PLUS test strip TEST BLOOD SUGAR TWICE DAILY 200 each 0  . ACCU-CHEK SOFTCLIX LANCETS lancets TEST BLOOD SUGAR TWICE DAILY 100 each 1  . allopurinol (ZYLOPRIM) 100 MG tablet TAKE 1 TABLET EVERY DAY 90 tablet 0  . amLODipine (NORVASC) 10 MG tablet Take 1 tablet (10 mg total) by mouth daily. 90 tablet 1  . aspirin 81 MG tablet Take 81 mg by mouth daily.      Marland Kitchen atorvastatin (LIPITOR) 40 MG tablet TAKE 1 TABLET EVERY DAY 90 tablet 0  . BD INSULIN SYRINGE U/F 31G X 5/16" 0.5 ML MISC INJECT EVERY DAY 90 each 3  . Blood Glucose Monitoring Suppl (ACCU-CHEK NANO SMARTVIEW) w/Device KIT 1 each by Does not apply route 2 (two) times daily. BID 1 kit 0  . enalapril (VASOTEC) 10 MG tablet TAKE 1 TABLET EVERY DAY 90 tablet 0  . furosemide (LASIX) 20 MG tablet Take 20 mg by mouth daily.     . insulin glargine (LANTUS) 100 UNIT/ML injection INJECT 20 UNITS EVERY NIGHT AT BEDTIME (DISCARD OPEN VIAL 28 DAYS AFTER FIRST OPEN) (Patient taking differently:  INJECT 18 UNITS EVERY NIGHT AT BEDTIME (DISCARD OPEN VIAL 28 DAYS AFTER FIRST OPEN)) 30 mL 0  . latanoprost (XALATAN) 0.005 % ophthalmic solution Place 1 drop into both eyes at bedtime.      . pioglitazone (ACTOS) 30 MG tablet TAKE 1 TABLET EVERY DAY 90 tablet 0  . Vitamin D, Ergocalciferol, (DRISDOL) 1.25 MG (50000 UT) CAPS capsule Take 1 capsule (50,000 Units total) by mouth every 3 (three) days. 10 capsule 0   No current facility-administered medications for this visit.     Family History  Problem Relation Age of Onset  . Hypertension Mother   . Cancer Mother        lung (nonsmoker)  . Stroke Father   . Hypertension Father   . Diabetes Father   . Hypertension Sister   .  Diabetes Sister   . Heart disease Sister   . Hypertension Sister   . Breast cancer Maternal Aunt 17       67 of old age  . Diabetes Paternal Grandmother     Review of Systems  All other systems reviewed and are negative.   Exam:   BP 140/68 (BP Location: Right Arm, Patient Position: Sitting, Cuff Size: Large)   Pulse 76   Resp 16   Ht 5' 2" (1.575 m)   Wt 242 lb (109.8 kg)   LMP 06/14/1998 (Approximate)   BMI 44.26 kg/m     General appearance: alert, cooperative and appears stated age Head: Normocephalic, without obvious abnormality, atraumatic Neck: no adenopathy, supple, symmetrical, trachea midline and thyroid normal to inspection and palpation Lungs: clear to auscultation bilaterally Breasts: normal appearance, no masses or tenderness, No nipple retraction or dimpling, No nipple discharge or bleeding, No axillary or supraclavicular adenopathy Heart: regular rate and rhythm Abdomen: soft, non-tender; no masses, no organomegaly Extremities: extremities normal, atraumatic, no cyanosis or edema Skin: Skin color, texture, turgor normal. No rashes or lesions Lymph nodes: Cervical, supraclavicular, and axillary nodes normal. No abnormal inguinal nodes palpated Neurologic: Grossly normal  Pelvic: External genitalia:  no lesions              Urethra:  normal appearing urethra with no masses, tenderness or lesions              Bartholins and Skenes: normal                 Vagina: normal appearing vagina with normal color and discharge, no lesions              Cervix: no lesions              Pap taken: No. Bimanual Exam:  Uterus:  normal size, contour, position, consistency, mobility, non-tender              Adnexa: no mass, fullness, tenderness              Rectal exam: Yes.  .  Confirms.              Anus:  normal sphincter tone, no lesions  Chaperone was present for exam.  Assessment:   Well woman visit with normal exam. Hx LGSIL.  Status post LEEP.  Plan: Mammogram  screening.  She will schedule.  Recommended self breast awareness. Pap next year.  Guidelines for Calcium, Vitamin D, regular exercise program including cardiovascular and weight bearing exercise. Labs with PCP.  Colonoscopy due soon.    Follow up annually and prn.   After visit summary provided.

## 2018-07-21 ENCOUNTER — Encounter: Payer: Self-pay | Admitting: Obstetrics and Gynecology

## 2018-07-21 ENCOUNTER — Other Ambulatory Visit: Payer: Self-pay

## 2018-07-21 ENCOUNTER — Ambulatory Visit: Payer: Medicare PPO | Admitting: Obstetrics and Gynecology

## 2018-07-21 VITALS — BP 140/68 | HR 76 | Resp 16 | Ht 62.0 in | Wt 242.0 lb

## 2018-07-21 DIAGNOSIS — Z01419 Encounter for gynecological examination (general) (routine) without abnormal findings: Secondary | ICD-10-CM | POA: Diagnosis not present

## 2018-07-21 NOTE — Patient Instructions (Signed)

## 2018-07-28 ENCOUNTER — Other Ambulatory Visit: Payer: Self-pay | Admitting: Family Medicine

## 2018-07-28 DIAGNOSIS — I10 Essential (primary) hypertension: Secondary | ICD-10-CM

## 2018-07-28 DIAGNOSIS — N183 Chronic kidney disease, stage 3 unspecified: Secondary | ICD-10-CM

## 2018-07-28 DIAGNOSIS — E1122 Type 2 diabetes mellitus with diabetic chronic kidney disease: Secondary | ICD-10-CM

## 2018-07-28 NOTE — Telephone Encounter (Signed)
Is this ok to refill?  

## 2018-07-31 ENCOUNTER — Other Ambulatory Visit: Payer: Self-pay | Admitting: Family Medicine

## 2018-08-01 ENCOUNTER — Ambulatory Visit (INDEPENDENT_AMBULATORY_CARE_PROVIDER_SITE_OTHER): Payer: Medicare PPO | Admitting: Family Medicine

## 2018-08-01 ENCOUNTER — Encounter (INDEPENDENT_AMBULATORY_CARE_PROVIDER_SITE_OTHER): Payer: Self-pay | Admitting: Family Medicine

## 2018-08-01 VITALS — BP 144/76 | HR 91 | Temp 98.3°F | Ht 62.0 in | Wt 238.0 lb

## 2018-08-01 DIAGNOSIS — Z794 Long term (current) use of insulin: Secondary | ICD-10-CM

## 2018-08-01 DIAGNOSIS — E559 Vitamin D deficiency, unspecified: Secondary | ICD-10-CM

## 2018-08-01 DIAGNOSIS — N183 Chronic kidney disease, stage 3 (moderate): Secondary | ICD-10-CM

## 2018-08-01 DIAGNOSIS — E1122 Type 2 diabetes mellitus with diabetic chronic kidney disease: Secondary | ICD-10-CM | POA: Diagnosis not present

## 2018-08-01 DIAGNOSIS — Z6841 Body Mass Index (BMI) 40.0 and over, adult: Secondary | ICD-10-CM

## 2018-08-01 MED ORDER — VITAMIN D (ERGOCALCIFEROL) 1.25 MG (50000 UNIT) PO CAPS: 50000.0000 [IU] | ORAL_CAPSULE | ORAL | 0 refills | Status: DC

## 2018-08-01 NOTE — Progress Notes (Signed)
Office: [phone]  /  Fax: [phone]   HPI:   Chief Complaint: OBESITY Kerri Richardson is here to discuss her progress with her obesity treatment plan. She is on the Category 2 plan and is following her eating plan approximately 90% of the time. She states she is doing weights and chair exercises 5-10 minutes 2-3 days per week. Kerri Richardson is not eating all the food/protein on her plan. She is only eating yogurt at breakfast. Her weight is 238 lb (108 kg) today and has not lost weight since her last visit. She has lost 48 lbs since starting treatment with [name].  Vitamin D deficiency Kerri Richardson has a diagnosis of Vitamin D deficiency. Her last Vit D level on 06/20/2018 was not at goal at 34.9. She is currently taking prescription Vit D and denies nausea, vomiting or muscle weakness.  Diabetes II on Insulin with CKD 3 Kerri Richardson has a diagnosis of diabetes type II which is well controlled on Actos 30 mg and Lantus 18 units. Kerri Richardson states fasting blood sugars range between 79 and 97; 2 hour postprandial values range between 80's and 160. She denies any hypoglycemic episodes. Last A1c was 5.5. She has been working on intensive lifestyle modifications including diet, exercise, and weight loss to help control her blood glucose levels.  ASSESSMENT AND PLAN:  Vitamin D deficiency - Plan: Vitamin D, Ergocalciferol, (DRISDOL) 1.25 MG (50000 UT) CAPS capsule  Type 2 diabetes mellitus with stage 3 chronic kidney disease, with long-term current use of insulin (HCC)  Class 3 severe obesity with serious comorbidity and body mass index (BMI) of 40.0 to 44.9 in adult, unspecified obesity type (Kerri Richardson)  PLAN:  Vitamin D Deficiency Kerri Richardson was informed that low Vitamin D levels contributes to fatigue and are associated with obesity, breast, and colon cancer. She agrees to continue to take prescription Vit D @ 50,000 IU every three days #10 with no refills and will follow-up for routine testing of Vitamin D, at least 2-3 times per year.  She was informed of the risk of over-replacement of Vitamin D and agrees to not increase her dose unless she discusses this with [name] first. Kerri Richardson agrees to follow-up with our clinic in 3 weeks.  Diabetes II on Insulin with CKD 3 Kerri Richardson has been given extensive diabetes education by myself today including ideal fasting and post-prandial blood glucose readings, individual ideal HgA1c goals  and hypoglycemia prevention. We discussed the importance of good blood sugar control to decrease the likelihood of diabetic complications such as nephropathy, neuropathy, limb loss, blindness, coronary artery disease, and death. We discussed the importance of intensive lifestyle modification including diet, exercise and weight loss as the first line treatment for diabetes. Kerri Richardson agrees to continue her Lantus @ 18 U qhs and Actos 30 mg and will follow-up at the agreed upon time.  Obesity Kerri Richardson is currently in the action stage of change. As such, her goal is to continue with weight loss efforts. She has agreed to follow the Category 2 plan. Kerri Richardson will continue current exercise regimen for weight loss and overall health benefits.  We discussed the following Behavioral Modification Strategies today: increasing lean protein intake and planning for success.  Kerri Richardson has agreed to follow-up with our clinic in 3 weeks. She was informed of the importance of frequent follow up visits to maximize her success with intensive lifestyle modifications for her multiple health conditions.  ALLERGIES: No Known Allergies  MEDICATIONS: Current Outpatient Medications on File Prior to Visit  Medication Sig Dispense Refill  .  ACCU-CHEK AVIVA PLUS test strip TEST BLOOD SUGAR TWICE DAILY 200 each 0  . ACCU-CHEK SOFTCLIX LANCETS lancets TEST BLOOD SUGAR TWICE DAILY 100 each 1  . allopurinol (ZYLOPRIM) 100 MG tablet TAKE 1 TABLET EVERY DAY 90 tablet 0  . amLODipine (NORVASC) 10 MG tablet Take 1 tablet (10 mg total) by mouth daily. 90 tablet 1    . aspirin 81 MG tablet Take 81 mg by mouth daily.      Marland Kitchen atorvastatin (LIPITOR) 40 MG tablet TAKE 1 TABLET EVERY DAY 90 tablet 0  . BD INSULIN SYRINGE U/F 31G X 5/16" 0.5 ML MISC INJECT EVERY DAY 90 each 3  . Blood Glucose Monitoring Suppl (ACCU-CHEK NANO SMARTVIEW) w/Device KIT 1 each by Does not apply route 2 (two) times daily. BID 1 kit 0  . enalapril (VASOTEC) 10 MG tablet TAKE 1 TABLET EVERY DAY 90 tablet 0  . furosemide (LASIX) 20 MG tablet Take 20 mg by mouth daily.     . insulin glargine (LANTUS) 100 UNIT/ML injection INJECT 20 UNITS EVERY NIGHT AT BEDTIME (DISCARD OPEN VIAL 28 DAYS AFTER FIRST OPEN) (Patient taking differently: INJECT 18 UNITS EVERY NIGHT AT BEDTIME (DISCARD OPEN VIAL 28 DAYS AFTER FIRST OPEN)) 30 mL 0  . latanoprost (XALATAN) 0.005 % ophthalmic solution Place 1 drop into both eyes at bedtime.      . pioglitazone (ACTOS) 30 MG tablet TAKE 1 TABLET EVERY DAY 90 tablet 0   No current facility-administered medications on file prior to visit.     PAST MEDICAL HISTORY: Past Medical History:  Diagnosis Date  . Arthritis   . CKD (chronic kidney disease) stage 3, GFR 30-59 ml/min (HCC)    Dr. [name]  . Diabetic retinopathy   . DM retinopathy ([place]) 08/17/12   early  . Dyspnea   . Essential hypertension, benign   . Glaucoma   . Gout   . Pure hypercholesterolemia   . Type II or unspecified type diabetes mellitus without mention of complication, not stated as uncontrolled     PAST SURGICAL HISTORY: Past Surgical History:  Procedure Laterality Date  . BRAIN SURGERY  1969   Burr holes in skull to alleviate pressure on the brain (no shunt)  . CATARACT EXTRACTION, BILATERAL  2012   Dr. [name]  . COLONOSCOPY  3/05, 11/2013   due again 2020  . COLPOSCOPY N/A 05/27/2015   Procedure: COLPOSCOPY;  Surgeon: [name], MD;  Location: [facility];  Service: Gynecology;  Laterality: N/A;  . LEEP N/A 05/27/2015   Procedure: LOOP ELECTROSURGICAL EXCISION PROCEDURE  (LEEP);  Surgeon: [name], MD;  Location: [facility];  Service: Gynecology;  Laterality: N/A;  . TONSILLECTOMY  age 78    SOCIAL HISTORY: Social History   Tobacco Use  . Smoking status: Former Smoker    Last attempt to quit: 06/14/2000    Years since quitting: 18.1  . Smokeless tobacco: Never Used  Substance Use Topics  . Alcohol use: No    Alcohol/week: 0.0 standard drinks  . Drug use: No    FAMILY HISTORY: Family History  Problem Relation Age of Onset  . Hypertension Mother   . Cancer Mother        lung (nonsmoker)  . Stroke Father   . Hypertension Father   . Diabetes Father   . Hypertension Sister   . Diabetes Sister   . Heart disease Sister   . Hypertension Sister   . Breast cancer Maternal Aunt  36       4 of old age  . Diabetes Paternal Grandmother    ROS: Review of Systems  Constitutional: Negative for weight loss.  Gastrointestinal: Negative for nausea and vomiting.  Musculoskeletal:       Negative for muscle weakness.  Endo/Heme/Allergies:       Negative for hypoglycemia.   PHYSICAL EXAM: Blood pressure (!) 144/76, pulse 91, temperature 98.3 F (36.8 C), height '5\' 2"'  (1.575 m), weight 238 lb (108 kg), last menstrual period 06/14/1998, SpO2 97 %. Body mass index is 43.53 kg/m. Physical Exam Vitals signs reviewed.  Constitutional:      Appearance: Normal appearance. She is obese.  Cardiovascular:     Rate and Rhythm: Normal rate.     Pulses: Normal pulses.  Pulmonary:     Effort: Pulmonary effort is normal.     Breath sounds: Normal breath sounds.  Musculoskeletal:     Comments: Positive for using a cane for ambulation.  Skin:    General: Skin is warm and dry.  Neurological:     Mental Status: She is alert and oriented to person, place, and time.  Psychiatric:        Behavior: Behavior normal.   RECENT LABS AND TESTS: BMET    Component Value Date/Time   NA 138 06/20/2018 1300   K 4.6 06/20/2018 1300   CL 101 06/20/2018 1300     CO2 19 (L) 06/20/2018 1300   GLUCOSE 88 06/20/2018 1300   GLUCOSE 139 (H) 05/16/2017 1040   BUN 32 (H) 06/20/2018 1300   CREATININE 1.41 (H) 06/20/2018 1300   CREATININE 1.64 (H) 05/16/2017 1040   CALCIUM 9.8 06/20/2018 1300   GFRNONAA 36 (L) 06/20/2018 1300   GFRAA 42 (L) 06/20/2018 1300   Lab Results  Component Value Date   HGBA1C 5.5 05/18/2018   HGBA1C 6.1 (H) 01/26/2018   HGBA1C 7.0 (H) 10/03/2017   HGBA1C 6.4 05/16/2017   HGBA1C 6.6 07/29/2016   Lab Results  Component Value Date   INSULIN 8.7 10/03/2017   CBC    Component Value Date/Time   WBC 7.0 10/03/2017 1224   WBC 7.4 05/16/2017 1040   RBC 4.42 10/03/2017 1224   RBC 4.51 05/16/2017 1040   HGB 12.5 10/03/2017 1224   HCT 39.4 10/03/2017 1224   PLT 260 05/16/2017 1040   MCV 89 10/03/2017 1224   MCH 28.3 10/03/2017 1224   MCH 27.9 05/16/2017 1040   MCHC 31.7 10/03/2017 1224   MCHC 32.3 05/16/2017 1040   RDW 15.3 10/03/2017 1224   LYMPHSABS 1.7 10/03/2017 1224   MONOABS 558 03/29/2016 1335   EOSABS 0.3 10/03/2017 1224   BASOSABS 0.0 10/03/2017 1224   Iron/TIBC/Ferritin/ %Sat No results found for: IRON, TIBC, FERRITIN, IRONPCTSAT Lipid Panel     Component Value Date/Time   CHOL 170 05/18/2018 1000   TRIG 128 05/18/2018 1000   HDL 40 05/18/2018 1000   CHOLHDL 4.3 05/18/2018 1000   CHOLHDL 4.4 05/16/2017 1040   VLDL 24 09/27/2016 1047   LDLCALC 104 (H) 05/18/2018 1000   LDLCALC 122 (H) 05/16/2017 1040   Hepatic Function Panel     Component Value Date/Time   PROT 7.0 06/20/2018 1300   ALBUMIN 4.3 06/20/2018 1300   AST 18 06/20/2018 1300   ALT 10 06/20/2018 1300   ALKPHOS 86 06/20/2018 1300   BILITOT 0.4 06/20/2018 1300      Component Value Date/Time   TSH 2.900 10/03/2017 1224   TSH 3.80 05/16/2017 1040  TSH 4.15 09/24/2015 0828    Ref. Range 06/20/2018 13:00  Vitamin D, 25-Hydroxy Latest Ref Range: 30.0 - 100.0 ng/mL 34.9   OBESITY BEHAVIORAL INTERVENTION VISIT  Today's visit was  #15  Starting weight: 286 lbs Starting date: 10/03/2017 Today's weight: 238 lbs  Today's date: 08/03/2018 Total lbs lost to date: 48 At least 15 minutes were spent on discussing the following behavioral intervention visit.  ASK: We discussed the diagnosis of obesity with Kerri Richardson today and Kerri Richardson agreed to give [name] permission to discuss obesity behavioral modification therapy today.  ASSESS: Kerri Richardson has the diagnosis of obesity and her BMI today is 43.53. Kerri Richardson is in the action stage of change.   ADVISE: Kerri Richardson was educated on the multiple health risks of obesity as well as the benefit of weight loss to improve her health. She was advised of the need for long term treatment and the importance of lifestyle modifications to improve her current health and to decrease her risk of future health problems.  AGREE: Multiple dietary modification options and treatment options were discussed and  Kerri Richardson agreed to follow the recommendations documented in the above note.  ARRANGE: Kerri Richardson was educated on the importance of frequent visits to treat obesity as outlined per CMS and USPSTF guidelines and agreed to schedule her next follow up appointment today.  IMichaelene Song, am acting as Location manager for [employer], FNP-C.  I have reviewed the above documentation for accuracy and completeness, and I agree with the above.  - Edan Juday, FNP-C.

## 2018-08-03 ENCOUNTER — Encounter (INDEPENDENT_AMBULATORY_CARE_PROVIDER_SITE_OTHER): Payer: Self-pay | Admitting: Family Medicine

## 2018-08-09 ENCOUNTER — Other Ambulatory Visit: Payer: Self-pay | Admitting: Family Medicine

## 2018-08-16 ENCOUNTER — Other Ambulatory Visit: Payer: Self-pay | Admitting: Family Medicine

## 2018-08-16 DIAGNOSIS — Z1231 Encounter for screening mammogram for malignant neoplasm of breast: Secondary | ICD-10-CM

## 2018-08-22 ENCOUNTER — Ambulatory Visit (INDEPENDENT_AMBULATORY_CARE_PROVIDER_SITE_OTHER): Payer: Medicare PPO | Admitting: Family Medicine

## 2018-08-22 ENCOUNTER — Encounter (INDEPENDENT_AMBULATORY_CARE_PROVIDER_SITE_OTHER): Payer: Self-pay | Admitting: Family Medicine

## 2018-08-22 VITALS — BP 147/68 | HR 96 | Temp 97.4°F | Ht 62.0 in | Wt 237.0 lb

## 2018-08-22 DIAGNOSIS — Z6841 Body Mass Index (BMI) 40.0 and over, adult: Secondary | ICD-10-CM | POA: Diagnosis not present

## 2018-08-22 DIAGNOSIS — E1122 Type 2 diabetes mellitus with diabetic chronic kidney disease: Secondary | ICD-10-CM

## 2018-08-22 DIAGNOSIS — N183 Chronic kidney disease, stage 3 unspecified: Secondary | ICD-10-CM

## 2018-08-22 DIAGNOSIS — I10 Essential (primary) hypertension: Secondary | ICD-10-CM | POA: Diagnosis not present

## 2018-08-22 DIAGNOSIS — Z794 Long term (current) use of insulin: Secondary | ICD-10-CM | POA: Diagnosis not present

## 2018-08-22 NOTE — Progress Notes (Signed)
Office: [phone]  /  Fax: [phone]   HPI:   Chief Complaint: OBESITY Kerri Richardson is here to discuss her progress with her obesity treatment plan. She is on the Category 2 plan and is following her eating plan approximately 90% of the time. She states she is doing chair aerobics 30 minutes 2 times per week. Kerri Richardson states she is sticking to the plan well but reports being bored with food at times.  Her weight is 237 lb (107.5 kg) today and has had a weight loss of 1 pound over a period of 3 weeks since her last visit. She has lost 49 lbs since starting treatment with [name].  Hypertension Kerri Richardson is a 78 y.o. female with hypertension. Kerri Richardson denies chest pain or shortness of breath on exertion. She is working weight loss to help control her blood pressure with the goal of decreasing her risk of heart attack and stroke. Kerri Richardson's blood pressure is slightly elevated but reports she is compliant with her medications.   Diabetes Mellitus Kerri Richardson has a diagnosis of diabetes mellitus. Kerri Richardson states fasting blood sugars range between 70 and 80 with occasional blood sugars in the 60's; 2 hour postprandial readings range between 120-130s. There is no hypoglycemia that she is aware of. Last A1c was 5.5 on 05/18/2018. She has been working on intensive lifestyle modifications including diet, exercise, and weight loss to help control her blood glucose levels.  ASSESSMENT AND PLAN:  Essential hypertension  Type 2 diabetes mellitus with stage 3 chronic kidney disease, with long-term current use of insulin (HCC)  Class 3 severe obesity with serious comorbidity and body mass index (BMI) of 40.0 to 44.9 in adult, unspecified obesity type ([place])  PLAN:  Hypertension We discussed sodium restriction, working on healthy weight loss, and a regular exercise program as the means to achieve improved blood pressure control. Kerri Richardson agreed with this plan and agreed to follow up as directed. We will continue to  monitor her blood pressure as well as her progress with the above lifestyle modifications. She will continue her Enalapril and Norvasc as prescribed and will watch for signs of hypotension as she continues her lifestyle modifications.  Diabetes Mellitus Kerri Richardson has been given extensive diabetes education by myself today including ideal fasting and post-prandial blood glucose readings, individual ideal Hgb A1c goals  and hypoglycemia prevention. We discussed the importance of good blood sugar control to decrease the likelihood of diabetic complications such as nephropathy, neuropathy, limb loss, blindness, coronary artery disease, and death. We discussed the importance of intensive lifestyle modification including diet, exercise and weight loss as the first line treatment for diabetes. Kerri Richardson will decrease her Lantus to 12 units nightly and continue Actos. She agrees to follow-up with our clinic in 3 weeks.  Obesity Kerri Richardson is currently in the action stage of change. As such, her goal is to continue with weight loss efforts. She has agreed to follow the Category 2 plan. Handouts were given on Seasonings, Lunch Options, and Recipes. Kerri Richardson has been instructed to continue her exercise regimen as noted above but to increase her chair aerobics to 3 times per week. We discussed the following Behavioral Modification Strategies today: work on meal planning and easy cooking plans and planning for success.  Kerri Richardson has agreed to follow-up with our clinic in 3 weeks. She was informed of the importance of frequent follow up visits to maximize her success with intensive lifestyle modifications for her multiple health conditions.  ALLERGIES: No Known Allergies  MEDICATIONS: Current Outpatient Medications on File Prior to Visit  Medication Sig Dispense Refill  . ACCU-CHEK AVIVA PLUS test strip TEST BLOOD SUGAR TWICE DAILY 200 each 0  . ACCU-CHEK SOFTCLIX LANCETS lancets TEST BLOOD SUGAR TWICE DAILY 200 each 0  .  allopurinol (ZYLOPRIM) 100 MG tablet TAKE 1 TABLET EVERY DAY 90 tablet 0  . amLODipine (NORVASC) 10 MG tablet Take 1 tablet (10 mg total) by mouth daily. 90 tablet 1  . aspirin 81 MG tablet Take 81 mg by mouth daily.      Marland Kitchen atorvastatin (LIPITOR) 40 MG tablet TAKE 1 TABLET EVERY DAY 90 tablet 0  . BD INSULIN SYRINGE U/F 31G X 5/16" 0.5 ML MISC INJECT EVERY DAY 90 each 3  . Blood Glucose Monitoring Suppl (ACCU-CHEK NANO SMARTVIEW) w/Device KIT 1 each by Does not apply route 2 (two) times daily. BID 1 kit 0  . enalapril (VASOTEC) 10 MG tablet TAKE 1 TABLET EVERY DAY 90 tablet 0  . furosemide (LASIX) 20 MG tablet Take 20 mg by mouth daily.     . insulin glargine (LANTUS) 100 UNIT/ML injection INJECT 20 UNITS EVERY NIGHT AT BEDTIME (DISCARD OPEN VIAL 28 DAYS AFTER FIRST OPEN) (Patient taking differently: INJECT 18 UNITS EVERY NIGHT AT BEDTIME (DISCARD OPEN VIAL 28 DAYS AFTER FIRST OPEN)) 30 mL 0  . latanoprost (XALATAN) 0.005 % ophthalmic solution Place 1 drop into both eyes at bedtime.      . pioglitazone (ACTOS) 30 MG tablet TAKE 1 TABLET EVERY DAY 90 tablet 0  . Vitamin D, Ergocalciferol, (DRISDOL) 1.25 MG (50000 UT) CAPS capsule Take 1 capsule (50,000 Units total) by mouth every 3 (three) days. 10 capsule 0   No current facility-administered medications on file prior to visit.     PAST MEDICAL HISTORY: Past Medical History:  Diagnosis Date  . Arthritis   . CKD (chronic kidney disease) stage 3, GFR 30-59 ml/min (HCC)    Dr. [name]  . Diabetic retinopathy   . DM retinopathy ([place]) 08/17/12   early  . Dyspnea   . Essential hypertension, benign   . Glaucoma   . Gout   . Pure hypercholesterolemia   . Type II or unspecified type diabetes mellitus without mention of complication, not stated as uncontrolled     PAST SURGICAL HISTORY: Past Surgical History:  Procedure Laterality Date  . BRAIN SURGERY  1969   Burr holes in skull to alleviate pressure on the brain (no shunt)  . CATARACT  EXTRACTION, BILATERAL  2012   Dr. [name]  . COLONOSCOPY  3/05, 11/2013   due again 2020  . COLPOSCOPY N/A 05/27/2015   Procedure: COLPOSCOPY;  Surgeon: [name], MD;  Location: [facility];  Service: Gynecology;  Laterality: N/A;  . LEEP N/A 05/27/2015   Procedure: LOOP ELECTROSURGICAL EXCISION PROCEDURE (LEEP);  Surgeon: [name], MD;  Location: [facility];  Service: Gynecology;  Laterality: N/A;  . TONSILLECTOMY  age 10    SOCIAL HISTORY: Social History   Tobacco Use  . Smoking status: Former Smoker    Last attempt to quit: 06/14/2000    Years since quitting: 18.2  . Smokeless tobacco: Never Used  Substance Use Topics  . Alcohol use: No    Alcohol/week: 0.0 standard drinks  . Drug use: No    FAMILY HISTORY: Family History  Problem Relation Age of Onset  . Hypertension Mother   . Cancer Mother        lung (nonsmoker)  .  Stroke Father   . Hypertension Father   . Diabetes Father   . Hypertension Sister   . Diabetes Sister   . Heart disease Sister   . Hypertension Sister   . Breast cancer Maternal Aunt 51       44 of old age  . Diabetes Paternal Grandmother    ROS: Review of Systems  Constitutional: Positive for weight loss.  Respiratory: Negative for shortness of breath.   Cardiovascular: Negative for chest pain.   PHYSICAL EXAM: Blood pressure (!) 147/68, pulse 96, temperature (!) 97.4 F (36.3 C), height _0  (1.575 m), weight 237 lb (107.5 kg), last menstrual period 06/14/1998, SpO2 96 %. Body mass index is 43.35 kg/m. Physical Exam Vitals signs reviewed.  Constitutional:      Appearance: Normal appearance. She is obese.  Cardiovascular:     Rate and Rhythm: Normal rate.     Pulses: Normal pulses.  Pulmonary:     Effort: Pulmonary effort is normal.     Breath sounds: Normal breath sounds.  Musculoskeletal: Normal range of motion.  Skin:    General: Skin is warm and dry.  Neurological:     Mental Status: She is alert and oriented  to person, place, and time.  Psychiatric:        Behavior: Behavior normal.   RECENT LABS AND TESTS: BMET    Component Value Date/Time   NA 138 06/20/2018 1300   K 4.6 06/20/2018 1300   CL 101 06/20/2018 1300   CO2 19 (L) 06/20/2018 1300   GLUCOSE 88 06/20/2018 1300   GLUCOSE 139 (H) 05/16/2017 1040   BUN 32 (H) 06/20/2018 1300   CREATININE 1.41 (H) 06/20/2018 1300   CREATININE 1.64 (H) 05/16/2017 1040   CALCIUM 9.8 06/20/2018 1300   GFRNONAA 36 (L) 06/20/2018 1300   GFRAA 42 (L) 06/20/2018 1300   Lab Results  Component Value Date   HGBA1C 5.5 05/18/2018   HGBA1C 6.1 (H) 01/26/2018   HGBA1C 7.0 (H) 10/03/2017   HGBA1C 6.4 05/16/2017   HGBA1C 6.6 07/29/2016   Lab Results  Component Value Date   INSULIN 8.7 10/03/2017   CBC    Component Value Date/Time   WBC 7.0 10/03/2017 1224   WBC 7.4 05/16/2017 1040   RBC 4.42 10/03/2017 1224   RBC 4.51 05/16/2017 1040   HGB 12.5 10/03/2017 1224   HCT 39.4 10/03/2017 1224   PLT 260 05/16/2017 1040   MCV 89 10/03/2017 1224   MCH 28.3 10/03/2017 1224   MCH 27.9 05/16/2017 1040   MCHC 31.7 10/03/2017 1224   MCHC 32.3 05/16/2017 1040   RDW 15.3 10/03/2017 1224   LYMPHSABS 1.7 10/03/2017 1224   MONOABS 558 03/29/2016 1335   EOSABS 0.3 10/03/2017 1224   BASOSABS 0.0 10/03/2017 1224   Iron/TIBC/Ferritin/ %Sat No results found for: IRON, TIBC, FERRITIN, IRONPCTSAT Lipid Panel     Component Value Date/Time   CHOL 170 05/18/2018 1000   TRIG 128 05/18/2018 1000   HDL 40 05/18/2018 1000   CHOLHDL 4.3 05/18/2018 1000   CHOLHDL 4.4 05/16/2017 1040   VLDL 24 09/27/2016 1047   LDLCALC 104 (H) 05/18/2018 1000   LDLCALC 122 (H) 05/16/2017 1040   Hepatic Function Panel     Component Value Date/Time   PROT 7.0 06/20/2018 1300   ALBUMIN 4.3 06/20/2018 1300   AST 18 06/20/2018 1300   ALT 10 06/20/2018 1300   ALKPHOS 86 06/20/2018 1300   BILITOT 0.4 06/20/2018 1300  Component Value Date/Time   TSH 2.900 10/03/2017 1224     TSH 3.80 05/16/2017 1040   TSH 4.15 09/24/2015 0828   Results for Jozwiak, Celsey M "MARIE" (MRN [id number]) as of 08/22/2018 17:46  Ref. Range 06/20/2018 13:00  Vitamin D, 25-Hydroxy Latest Ref Range: 30.0 - 100.0 ng/mL 34.9   OBESITY BEHAVIORAL INTERVENTION VISIT  Today's visit was #16  Starting weight: 286 lbs Starting date: 10/03/2017 Today's weight: 237 lbs  Today's date: 08/23/2018 Total lbs lost to date: 44 At least 15 minutes were spent on discussing the following behavioral intervention visit.    08/22/2018  Height _0  (1.575 m)  Weight 237 lb (107.5 kg)  BMI (Calculated) 43.34  BLOOD PRESSURE - SYSTOLIC 407  BLOOD PRESSURE - DIASTOLIC 68   Body Fat % 68.0 %  Total Body Water (lbs) 78 lbs   ASK: We discussed the diagnosis of obesity with Brita Romp today and Shera agreed to give [name] permission to discuss obesity behavioral modification therapy today.  ASSESS: Sunaina has the diagnosis of obesity and her BMI today is 43.34. Aava is in the action stage of change.   ADVISE: Sakara was educated on the multiple health risks of obesity as well as the benefit of weight loss to improve her health. She was advised of the need for long term treatment and the importance of lifestyle modifications to improve her current health and to decrease her risk of future health problems.  AGREE: Multiple dietary modification options and treatment options were discussed and  Eiliana agreed to follow the recommendations documented in the above note.  ARRANGE: Aeris was educated on the importance of frequent visits to treat obesity as outlined per CMS and USPSTF guidelines and agreed to schedule her next follow up appointment today.  IMichaelene Song, am acting as Location manager for [employer], FNP-C.  I have reviewed the above documentation for accuracy and completeness, and I agree with the above.  - Glorya Bartley, FNP-C.

## 2018-08-23 ENCOUNTER — Encounter (INDEPENDENT_AMBULATORY_CARE_PROVIDER_SITE_OTHER): Payer: Self-pay | Admitting: Family Medicine

## 2018-09-01 ENCOUNTER — Telehealth: Payer: Self-pay

## 2018-09-01 DIAGNOSIS — N183 Chronic kidney disease, stage 3 unspecified: Secondary | ICD-10-CM

## 2018-09-01 DIAGNOSIS — E1122 Type 2 diabetes mellitus with diabetic chronic kidney disease: Secondary | ICD-10-CM

## 2018-09-01 NOTE — Telephone Encounter (Signed)
Patient stated she will be out of Lantus soon and will need a order sent to her pharmacy. Patient wanted me to inform you to send the order because you are already aware of how it should be sent so that she is not getting too much at once. Patient stated she is not out yet but is trying to be proactive and that the request is not urgent.

## 2018-09-04 MED ORDER — INSULIN GLARGINE 100 UNIT/ML ~~LOC~~ SOLN: SUBCUTANEOUS | 2 refills | Status: DC

## 2018-09-04 NOTE — Telephone Encounter (Signed)
Done

## 2018-09-12 ENCOUNTER — Ambulatory Visit (INDEPENDENT_AMBULATORY_CARE_PROVIDER_SITE_OTHER): Payer: Medicare PPO | Admitting: Family Medicine

## 2018-09-12 ENCOUNTER — Telehealth: Payer: Self-pay | Admitting: Family Medicine

## 2018-09-12 DIAGNOSIS — N183 Chronic kidney disease, stage 3 (moderate): Principal | ICD-10-CM

## 2018-09-12 DIAGNOSIS — E1122 Type 2 diabetes mellitus with diabetic chronic kidney disease: Secondary | ICD-10-CM

## 2018-09-12 MED ORDER — INSULIN GLARGINE 100 UNIT/ML ~~LOC~~ SOLN: SUBCUTANEOUS | 2 refills | Status: DC

## 2018-09-12 NOTE — Telephone Encounter (Signed)
Pt called and needs refill  on Lantus sent to [facility] pharmacy instead of [facility]. Pt would like a call back when this is sent [phone]

## 2018-09-12 NOTE — Telephone Encounter (Signed)
Can you check on this?

## 2018-09-12 NOTE — Telephone Encounter (Signed)
Done

## 2018-09-14 ENCOUNTER — Ambulatory Visit: Payer: Medicare PPO

## 2018-09-21 DIAGNOSIS — H409 Unspecified glaucoma: Secondary | ICD-10-CM | POA: Diagnosis not present

## 2018-09-21 DIAGNOSIS — I10 Essential (primary) hypertension: Secondary | ICD-10-CM | POA: Diagnosis not present

## 2018-09-21 DIAGNOSIS — H547 Unspecified visual loss: Secondary | ICD-10-CM | POA: Diagnosis not present

## 2018-09-21 DIAGNOSIS — R609 Edema, unspecified: Secondary | ICD-10-CM | POA: Diagnosis not present

## 2018-09-21 DIAGNOSIS — E785 Hyperlipidemia, unspecified: Secondary | ICD-10-CM | POA: Diagnosis not present

## 2018-09-21 DIAGNOSIS — Z794 Long term (current) use of insulin: Secondary | ICD-10-CM | POA: Diagnosis not present

## 2018-09-21 DIAGNOSIS — Z7982 Long term (current) use of aspirin: Secondary | ICD-10-CM | POA: Diagnosis not present

## 2018-09-21 DIAGNOSIS — M109 Gout, unspecified: Secondary | ICD-10-CM | POA: Diagnosis not present

## 2018-09-21 DIAGNOSIS — E119 Type 2 diabetes mellitus without complications: Secondary | ICD-10-CM | POA: Diagnosis not present

## 2018-09-30 ENCOUNTER — Other Ambulatory Visit: Payer: Self-pay | Admitting: Family Medicine

## 2018-09-30 DIAGNOSIS — I10 Essential (primary) hypertension: Secondary | ICD-10-CM

## 2018-09-30 DIAGNOSIS — N183 Chronic kidney disease, stage 3 unspecified: Secondary | ICD-10-CM

## 2018-09-30 DIAGNOSIS — E1122 Type 2 diabetes mellitus with diabetic chronic kidney disease: Secondary | ICD-10-CM

## 2018-10-02 ENCOUNTER — Other Ambulatory Visit: Payer: Self-pay | Admitting: Family Medicine

## 2018-10-07 ENCOUNTER — Other Ambulatory Visit: Payer: Self-pay | Admitting: Family Medicine

## 2018-10-07 DIAGNOSIS — E119 Type 2 diabetes mellitus without complications: Secondary | ICD-10-CM

## 2018-10-31 ENCOUNTER — Ambulatory Visit: Payer: Medicare PPO

## 2018-11-08 IMAGING — US US PELVIS COMPLETE TRANSABD/TRANSVAG
1 series · 14 of 25 positions shown · non-contrast
Comparison: None.

CLINICAL DATA: Right lower quadrant abdominal swelling. Evaluate
for pelvic mass.



[Series 2: us pelvis complete transabd/transvag · 0.11mm/px · 35 acquisitions, 14 frames shown]
[im 1/35]
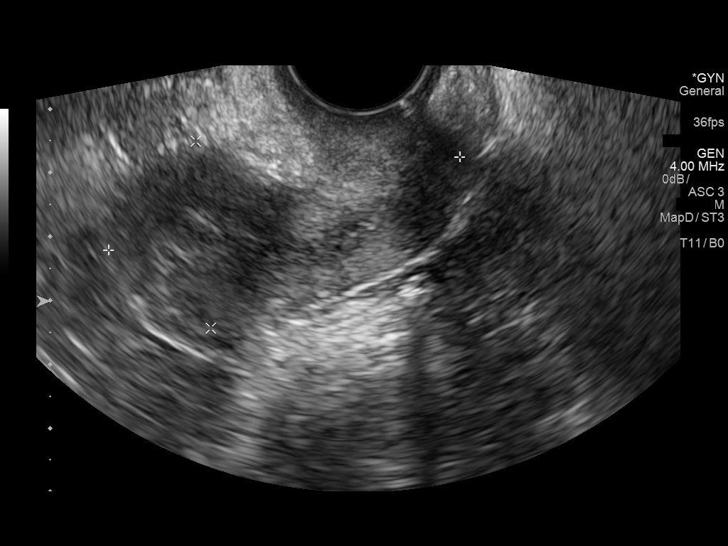
[im 3/35]
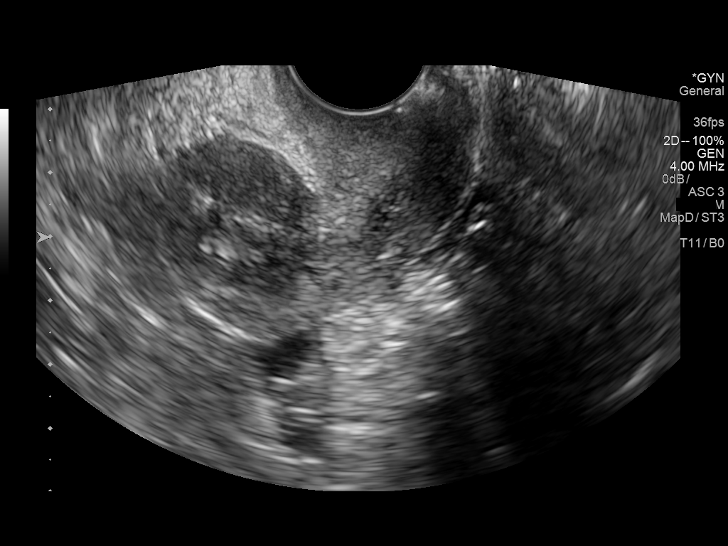
[im 6/35]
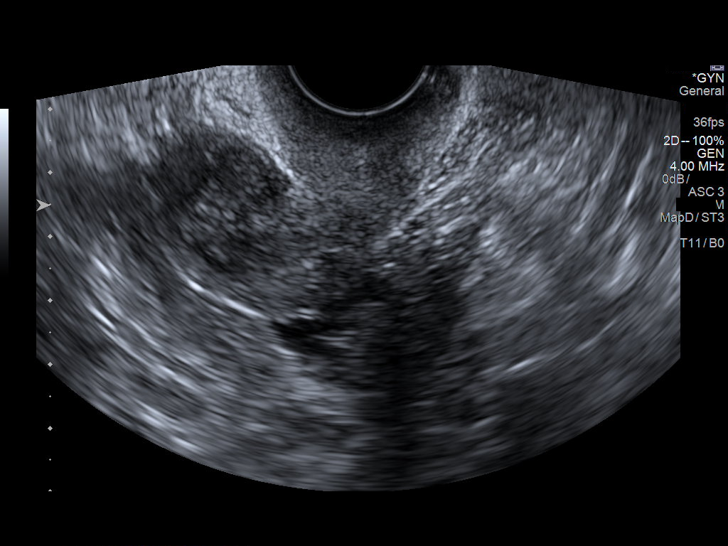
[im 9/35]
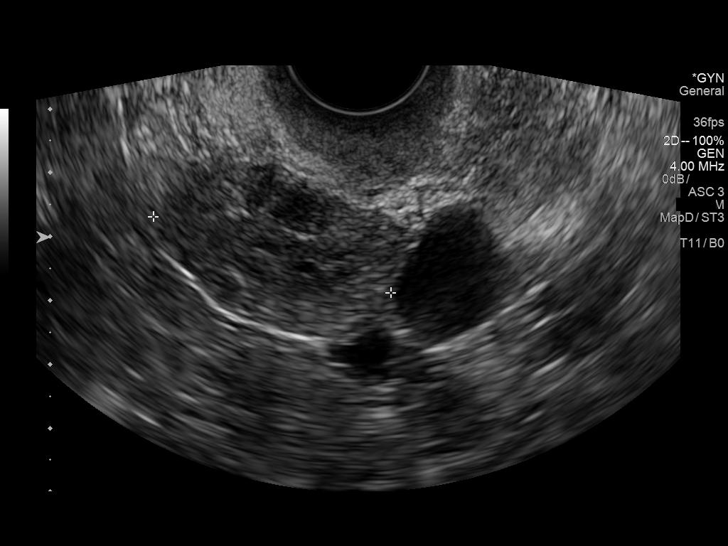
[im 12/35]
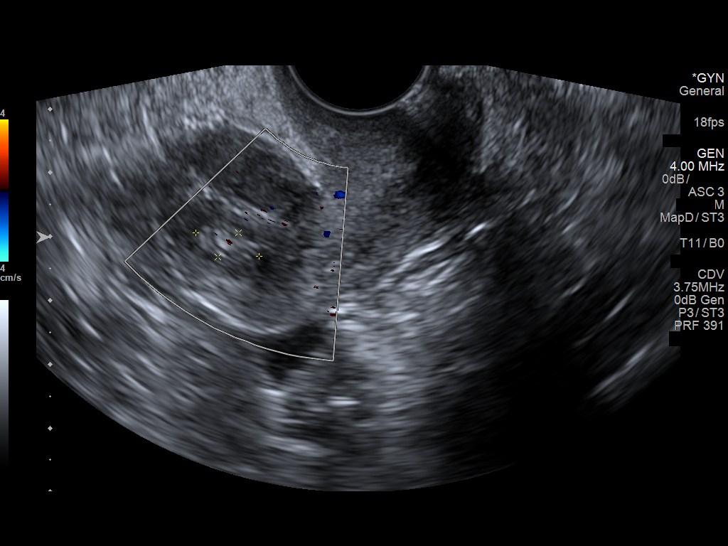
[im 13/35]
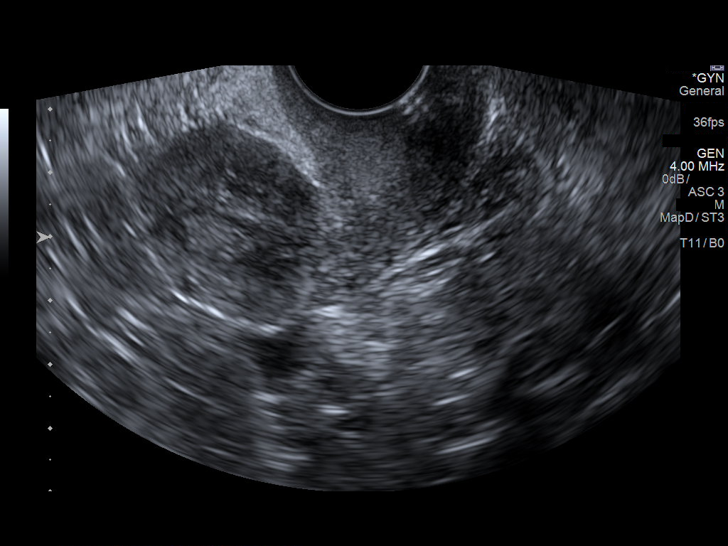
[im 16/35]
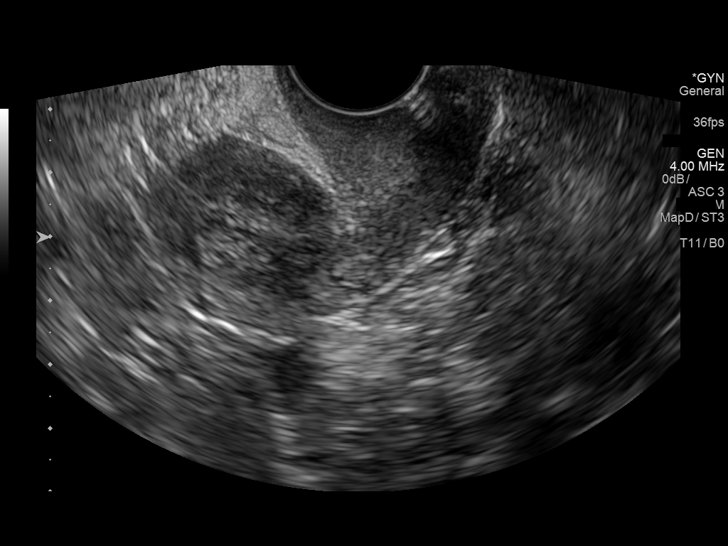
[im 19/35]
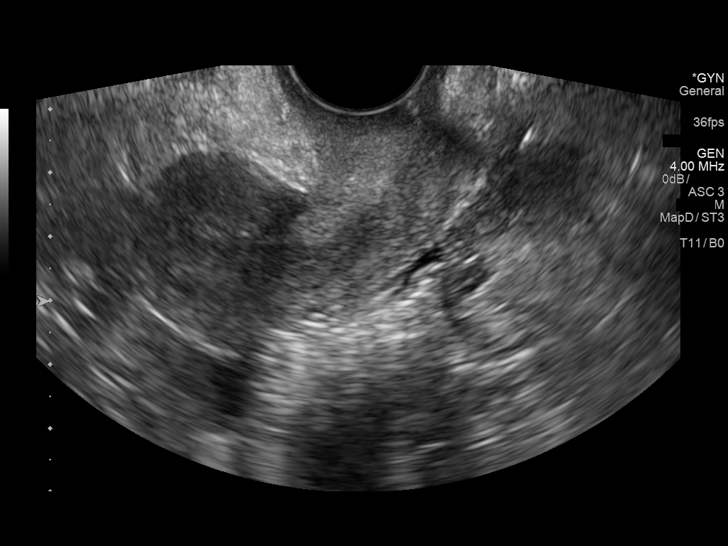
[im 22/35]
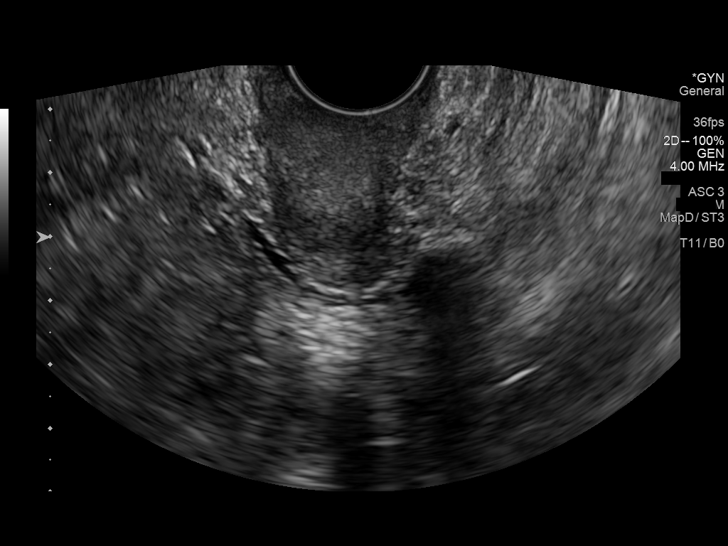
[im 23/35]
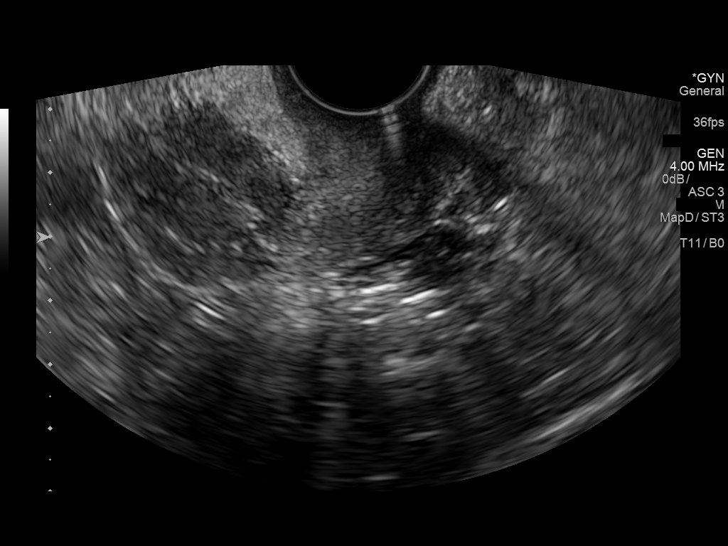
[im 26/35]
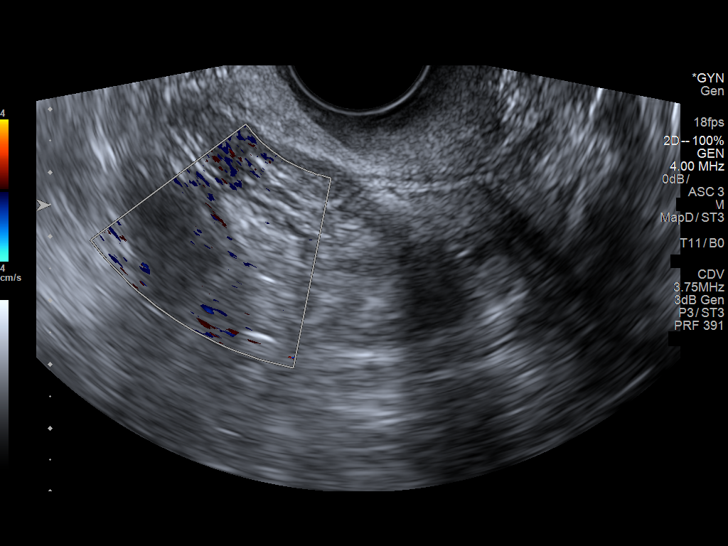
[im 29/35]
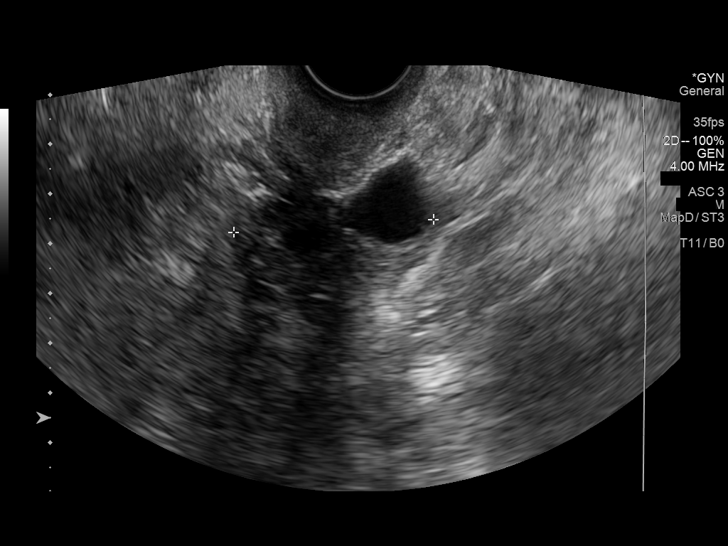
[im 32/35]
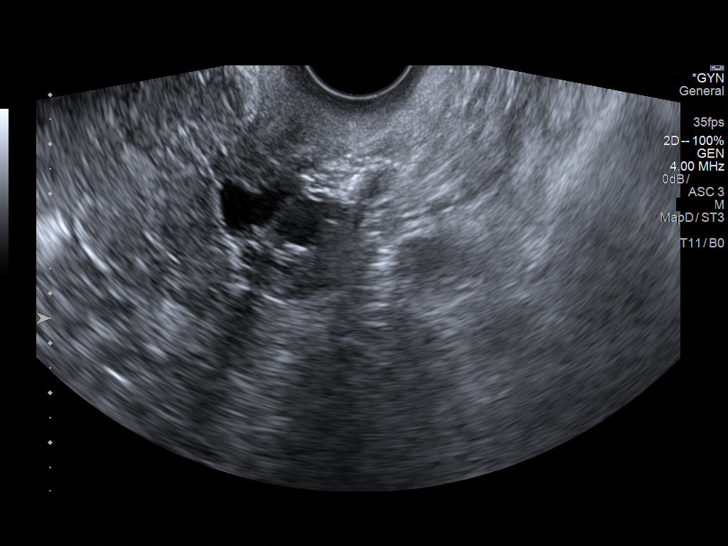
[im 35/35]
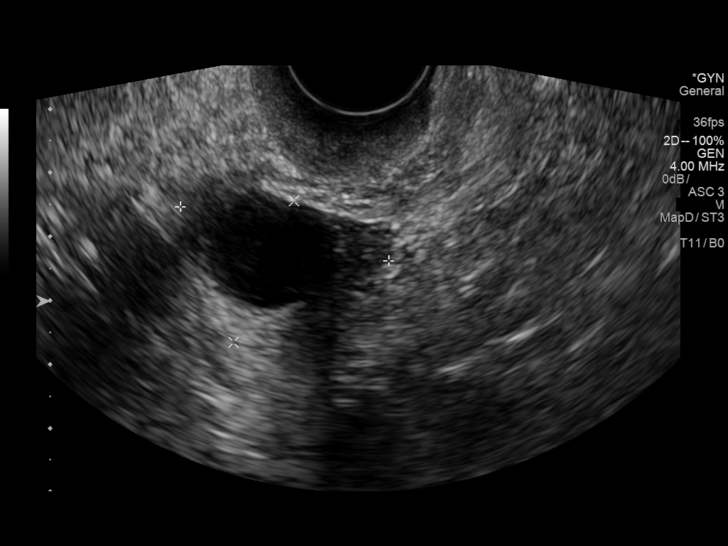

[14 of 25 positions shown; findings below may reference images not displayed]

FINDINGS: Uterus

Measurements: 5.7 x 2.9 x 3.9 cm. No fibroids or other mass
visualized. Limited visualization of the fundus, but grossly normal
fundal contour.

Endometrium

Thickness: 4 mm. Potentially two endometrial echogenic complexes are
seen superiorly.

Right ovary

Not visualized.

Left ovary

Measurements: 3.4 x 2.4 x 4.0 cm. Normal appearance/no adnexal mass.

Other findings

No abnormal free fluid.
IMPRESSION: 1. Limited evaluation. Potentially two endometrial echogenic
complexes are seen superiorly, suggesting possible septate uterus.
2. Normal left ovary.  Nonvisualization of the right ovary.

## 2018-11-08 IMAGING — US US ABDOMEN COMPLETE
1 series · 13 of 25 positions shown · non-contrast
Comparison: None in PACs

CLINICAL DATA: Right-sided abdominal swelling and lower abdominal
discomfort for the past 6 months. History of obesity, diabetes.

EXAM:
ABDOMEN ULTRASOUND COMPLETE

[Series 1: us abdomen complete · 0.25mm/px · 13 of 86 slices shown]
[im 1/86]
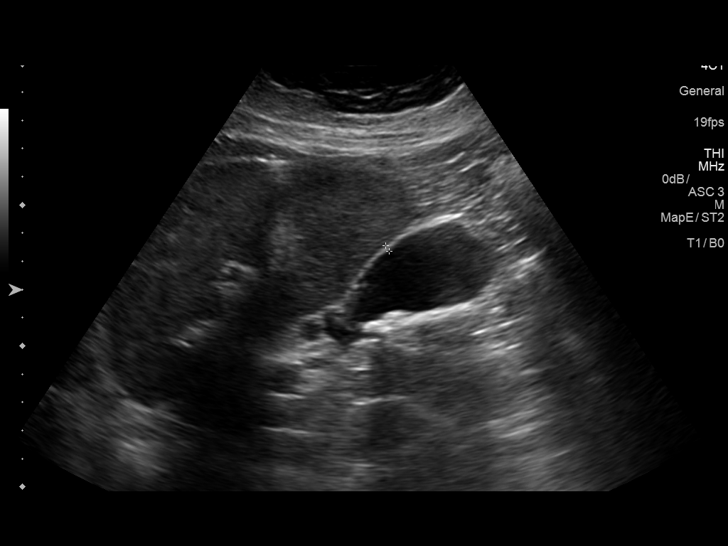
[im 8/86]
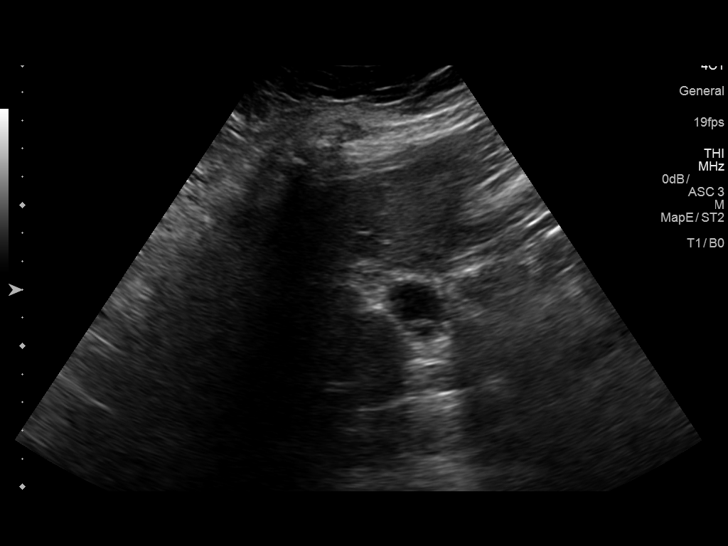
[im 15/86]
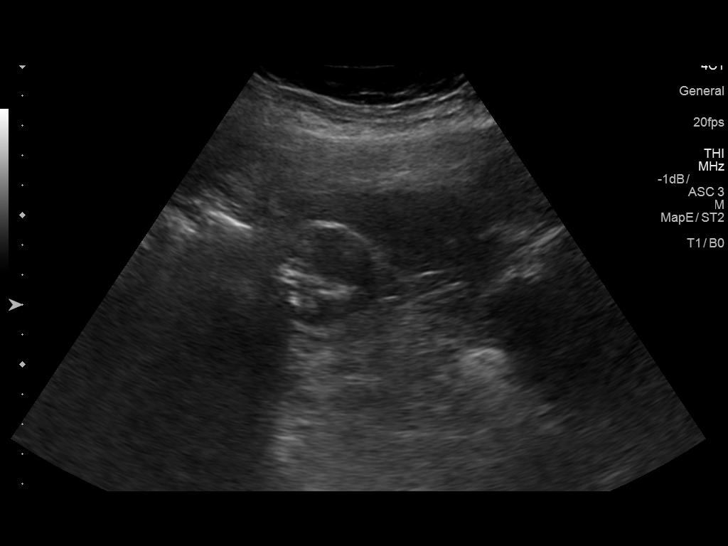
[im 22/86]
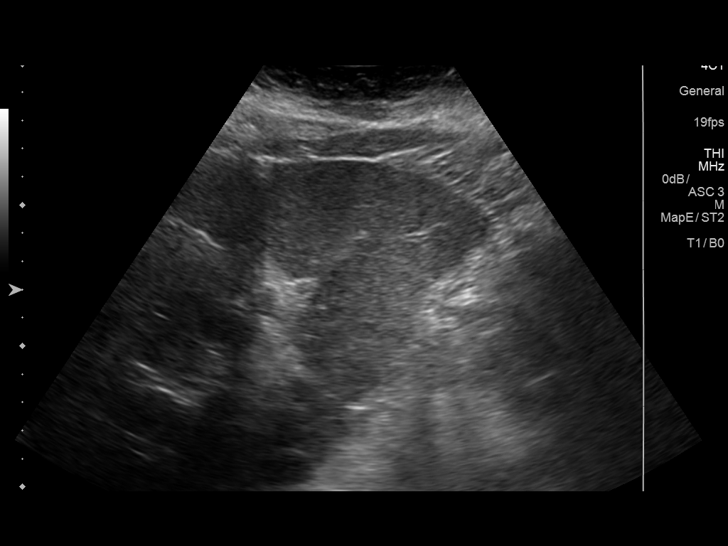
[im 29/86]
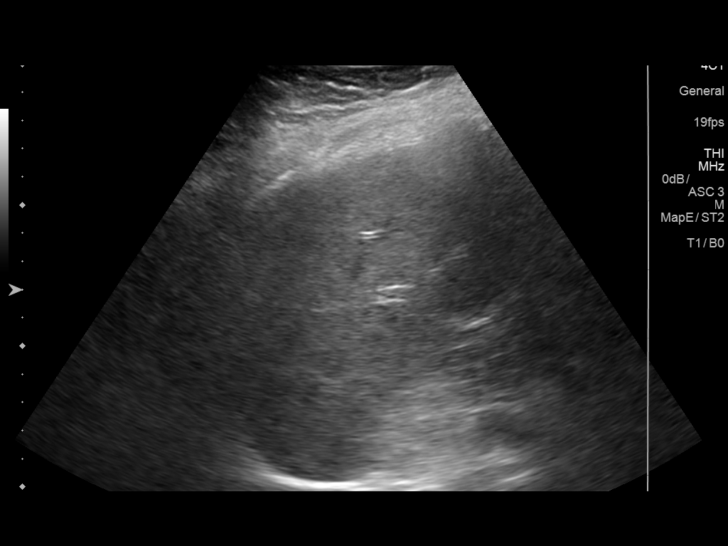
[im 36/86]
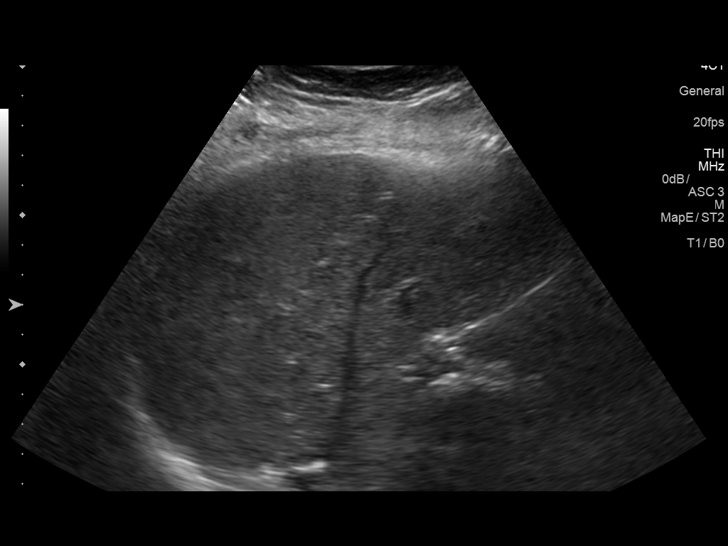
[im 43/86]
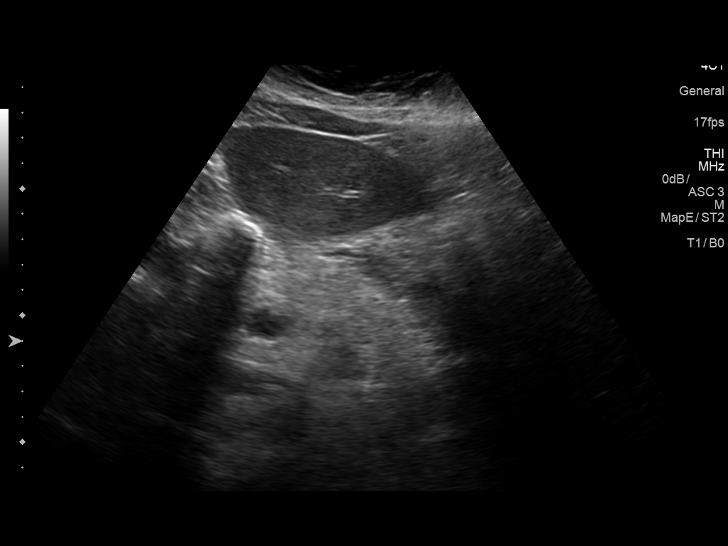
[im 50/86]
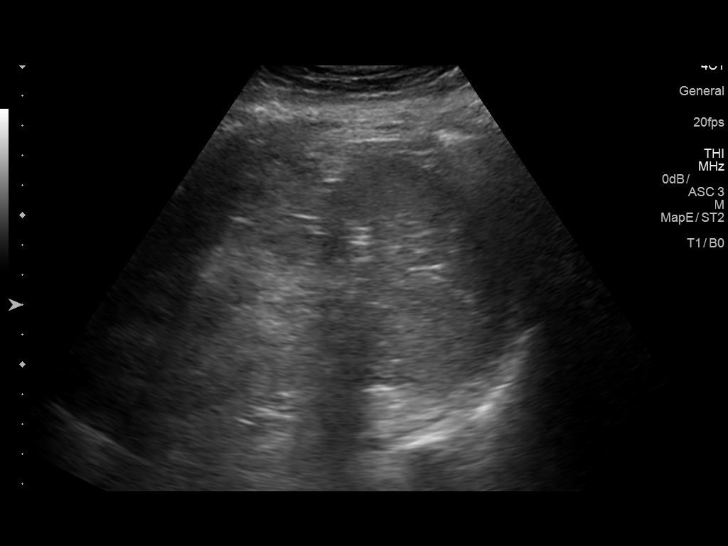
[im 57/86]
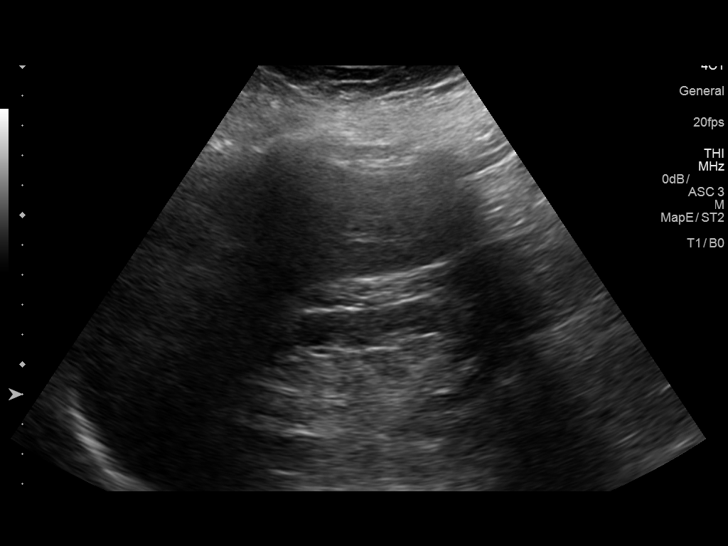
[im 64/86]
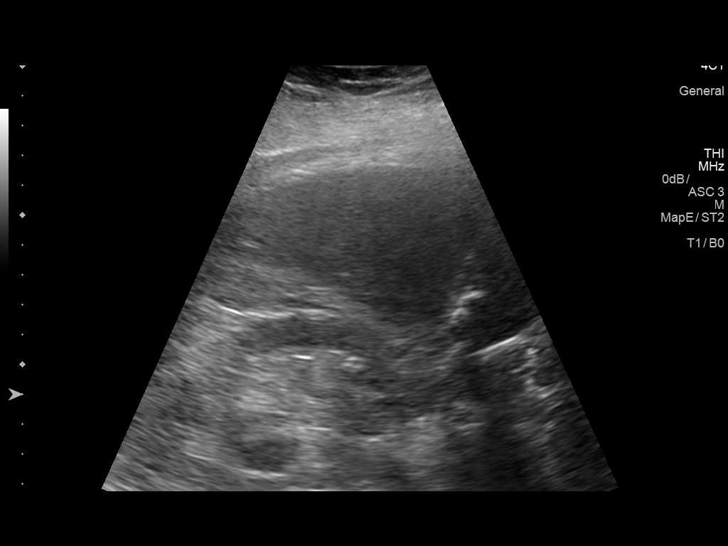
[im 71/86]
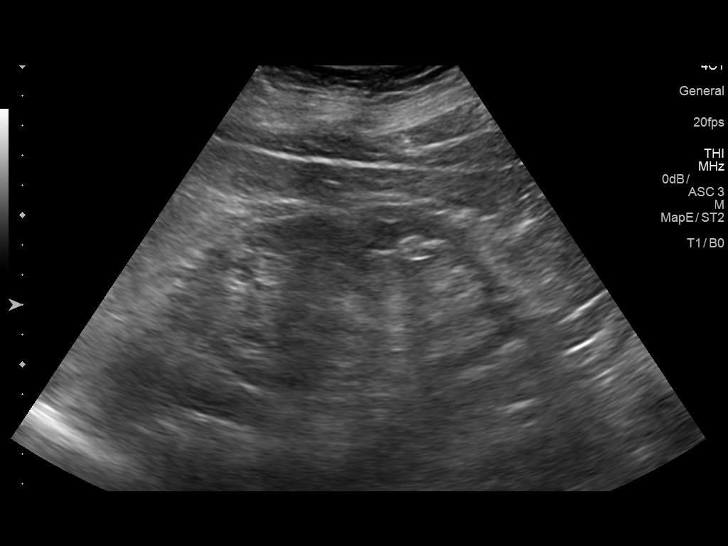
[im 78/86]
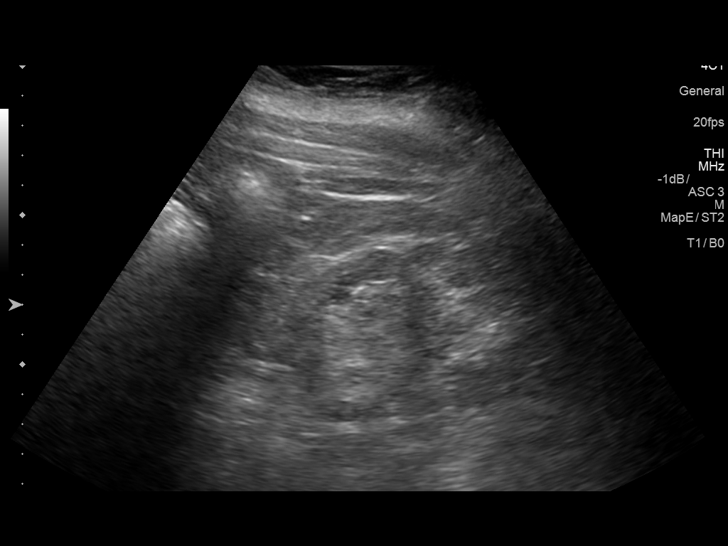
[im 86/86]
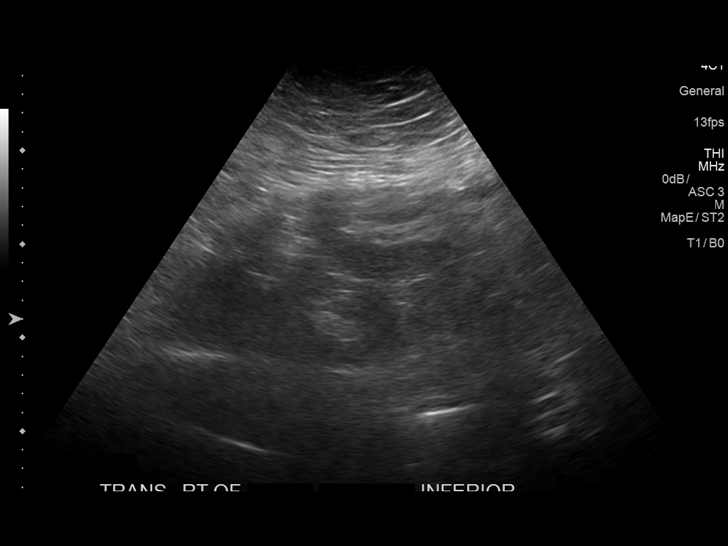

[13 of 25 positions shown; findings below may reference images not displayed]

FINDINGS: Gallbladder: The gallbladder is adequately distended. There are
multiple echogenic mobile shadowing stones. The largest measures
cm. There is no gallbladder wall thickening, pericholecystic fluid,
or positive sonographic Murphy's sign.

Common bile duct: Diameter: 3.8 mm where visualized.

Liver: The hepatic echotexture is increased with some difficulty
penetrating the organ with the ultrasound beam. There is no focal
mass nor ductal dilation. The surface contour appears smooth. Portal
vein is patent on color Doppler imaging with normal direction of
blood flow towards the liver.

IVC: No abnormality visualized.

Pancreas: The pancreatic body appears normal. The pancreatic head
and tail are obscured by bowel gas.

Spleen: Size and appearance within normal limits.

Right Kidney: Length: 10 cm. There is mild diffuse cortical
thinning. The cortical echotexture is approximately equal to that of
the liver. There is no hydronephrosis.

Left Kidney: Length: 11.1 cm. The cortical echotexture is similar to
that of the right kidney. There is mild diffuse cortical thinning.

Abdominal aorta: Limited visualization of the abdominal aorta due to
the patient's body habitus and due to bowel gas.

Other findings: In the area of clinical concern no definite soft
tissue abnormality is observed.
IMPRESSION: No definite abdominal wall abnormality is observed but the study is
limited due to the patient's body habitus.

Gallstones without sonographic evidence of acute cholecystitis.

Increased hepatic echotexture most compatible with fatty
infiltrative change.

Mildly increased renal cortical echotexture with diffuse cortical
thinning compatible with medical renal disease. No hydronephrosis.
Limited visualization of the pancreas and abdominal aorta.

## 2018-11-13 ENCOUNTER — Other Ambulatory Visit: Payer: Self-pay | Admitting: *Deleted

## 2018-11-13 DIAGNOSIS — E1169 Type 2 diabetes mellitus with other specified complication: Secondary | ICD-10-CM

## 2018-11-13 DIAGNOSIS — E119 Type 2 diabetes mellitus without complications: Secondary | ICD-10-CM

## 2018-11-13 DIAGNOSIS — Z79899 Other long term (current) drug therapy: Secondary | ICD-10-CM

## 2018-11-13 DIAGNOSIS — E785 Hyperlipidemia, unspecified: Secondary | ICD-10-CM

## 2018-11-15 ENCOUNTER — Other Ambulatory Visit: Payer: Self-pay | Admitting: Family Medicine

## 2018-11-15 ENCOUNTER — Other Ambulatory Visit: Payer: Self-pay

## 2018-11-15 ENCOUNTER — Other Ambulatory Visit: Payer: Medicare PPO

## 2018-11-15 DIAGNOSIS — E119 Type 2 diabetes mellitus without complications: Secondary | ICD-10-CM

## 2018-11-15 DIAGNOSIS — Z1231 Encounter for screening mammogram for malignant neoplasm of breast: Secondary | ICD-10-CM

## 2018-11-15 DIAGNOSIS — Z79899 Other long term (current) drug therapy: Secondary | ICD-10-CM | POA: Diagnosis not present

## 2018-11-15 DIAGNOSIS — E785 Hyperlipidemia, unspecified: Secondary | ICD-10-CM

## 2018-11-16 LAB — LIPID PANEL
Chol/HDL Ratio: 4.1 ratio (ref 0.0–4.4)
Cholesterol, Total: 174 mg/dL (ref 100–199)
HDL: 42 mg/dL (ref >39)
LDL Calculated: 107 mg/dL — ABNORMAL HIGH (ref 0–99)
Triglycerides: 125 mg/dL (ref 0–149)
VLDL Cholesterol Cal: 25 mg/dL (ref 5–40)

## 2018-11-16 LAB — HEMOGLOBIN A1C
Est. average glucose Bld gHb Est-mCnc: 128 mg/dL
Hgb A1c MFr Bld: 6.1 % — ABNORMAL HIGH (ref 4.8–5.6)

## 2018-11-19 NOTE — Progress Notes (Signed)
Start time: 11:28 End time: 11:59   Virtual Visit via Video Note  I connected with Kerri Richardson on 11/20/2018 by a video enabled telemedicine application and verified that I am speaking with the correct person using two identifiers.  Location: Patient: at home, alone Provider: office   I discussed the limitations of evaluation and management by telemedicine and the availability of in person appointments. The patient expressed understanding and agreed to proceed. She consents to her insurance being filed for this visit.  History of Present Illness:  Chief Complaint  Patient presents with  . Medication Management   Patient presents for 6 month follow-up on her chronic conditions. She had labs done prior to today's virtual visit, see below.  [facility], last seen in March.  At that time her weight is 237 lb (107.5 kg), and had lost a total of 49 lbs. She hasn't been back in 3 months, since COVID-19 pandemic. She didn't think she could do a virtual visit with her phone.  She has gained 1 pound, she thinks.  Diabetes follow-up:Diabetes has been well controlled, previously closely monitored by the weight loss clinic.  Her Lantus dose had been titrated down to 12 U at her visit in March, had a few sugars in the 60's and A1c in December was 5.5.  Morning sugars are running 79-110 Once saw 195 at night (3 hours after eating), usually 135-140's. Last eye examwas 01/2018. Patient follows a low sugar diet and checks feet regularly without concerns.  Hyperlipidemia follow-up: Patient is reportedly following a low-fat, low cholesterol diet. Compliant with medications (atorvastatin) and denies medication side effects.  Hypertension follow-up: Blood pressure is not checked elsewhere, except at other doctor visits. Someone is supposed to be ordering her a BP monitor, hasn't gotten one yet.  It is noted that her BP's have been higher on the  last 3 checks than they had been running prior to our last visit 6 months ago.  Denies dizziness, headaches, chest pain. Denies side effects of medications. She reports compliance with amlodipine and enalapril (both 60m daily). She walks back and forth around her house for exercise, twice throughout the day.  BP Readings from Last 3 Encounters:  08/22/18 (!) 147/68  08/01/18 (!) 144/76  07/21/18 140/68   Vitamin D deficiency--this was being monitored and treated by the weight loss clinic. Last level was normal at 34.9 in 06/2018.  Looks like she was last prescribed weekly rx for #10 pills in 07/2018. She is currently taking Vitamin D 2000 IU daily.  H/o toe pain--goutand degenerative changes. On allopurinol for prevention, with colchicine for prn use (hasn't needed in a long time).Only on 1055mf allopurinol(dose not increased due to impaired Cr and lack of gout symptoms on this dose).Denies any pain or swelling. Lab Results  Component Value Date   LABURIC 6.3 05/18/2018   [facility]--sees Dr. PoFlorene Glenearly--he is retiring, and she will need to see one of his partners for next visit. Due again in July.Denies any edema, on lasix 2052maily.  H/o colon polyps, noted last 11/2013.  She is due for 5 year f/[facility]  Outpatient Encounter Medications as of 11/20/2018  Medication Sig Note  . ACCU-CHEK AVIVA PLUS test strip TEST BLOOD SUGAR TWICE DAILY   . Accu-Chek Softclix Lancets lancets TEST BLOOD SUGAR TWICE DAILY   . allopurinol (ZYLOPRIM) 100 MG tablet TAKE 1 TABLET EVERY DAY   .  amLODipine (NORVASC) 10 MG tablet Take 1 tablet (10 mg total) by mouth daily.   Marland Kitchen aspirin 81 MG tablet Take 81 mg by mouth daily.     Marland Kitchen atorvastatin (LIPITOR) 40 MG tablet TAKE 1 TABLET EVERY DAY   . Blood Glucose Monitoring Suppl (ACCU-CHEK NANO SMARTVIEW) w/Device KIT 1 each by Does not apply route 2 (two) times daily. BID   . DROPLET INSULIN SYRINGE 31G X 5/16" 0.5 ML MISC INJECT  EVERY DAY   . enalapril (VASOTEC) 10 MG tablet TAKE 1 TABLET EVERY DAY   . furosemide (LASIX) 20 MG tablet Take 20 mg by mouth daily.  04/15/2016: Taking 102m daily  . insulin glargine (LANTUS) 100 UNIT/ML injection INJECT 20 UNITS EVERY NIGHT AT BEDTIME (DISCARD OPEN VIAL 28 DAYS AFTER FIRST OPEN)   . latanoprost (XALATAN) 0.005 % ophthalmic solution Place 1 drop into both eyes at bedtime.     . pioglitazone (ACTOS) 30 MG tablet TAKE 1 TABLET EVERY DAY   . Vitamin D, Ergocalciferol, (DRISDOL) 1.25 MG (50000 UT) CAPS capsule Take 1 capsule (50,000 Units total) by mouth every 3 (three) days. 11/20/2018: She finished the prescription.  She has been taking 2000 IU daily  . [DISCONTINUED] ACCU-CHEK SOFTCLIX LANCETS lancets TEST BLOOD SUGAR TWICE DAILY    No facility-administered encounter medications on file as of 11/20/2018.    No Known Allergies  ROS:  Denies fever, chills, URI symptoms, cough, shortness of breath, chest pain, headaches, dizziness, GI or GU complaints. Moods are good. No bleeding, bruising, rashes or other concerns. See HPI.   Observations/Objective:  Wt 240 lb (108.9 kg)   LMP 06/14/1998 (Approximate)   BMI 43.90 kg/m   Wt Readings from Last 3 Encounters:  08/22/18 237 lb (107.5 kg)  08/01/18 238 lb (108 kg)  07/21/18 242 lb (109.8 kg)   Exam is limited due to the virtual nature of the visit. She is alert, oriented, and in good spirits.  Cranial nerves are grossly intact. Normal mood, affect, grooming.   Lab Results  Component Value Date   HGBA1C 6.1 (H) 11/15/2018   Lab Results  Component Value Date   CHOL 174 11/15/2018   HDL 42 11/15/2018   LDLCALC 107 (H) 11/15/2018   TRIG 125 11/15/2018   CHOLHDL 4.1 11/15/2018    Assessment and Plan:  Hyperlipidemia associated with type 2 diabetes mellitus ([place] - LDL above goal; increase atorvastatin to 832m- Plan: atorvastatin (LIPITOR) 80 MG tablet, Lipid panel, Hepatic function panel  Essential hypertension -  above goal per last checks in February and March, unable to monitor currently. waiting to get monitor.  Address with nephro next month, bring list/monitor  Diabetes mellitus with stage 3 chronic kidney disease ([place] Type 2 diabetes mellitus with diabetic nephropathy, with long-term current use of insulin (HCC)  Gout involving toe of right foot, unspecified cause, unspecified chronicity - stable, continue allopurinol  Ensure f/u scheduled with [facility] (and to ask to have notes and labs sent to us)--has appt 7/13. Ensure f/u scheduled for colonoscopy with Dr. MaCollene MaresF/U in 6 months for CPE/AWV/med check Schedule lab visit in 2-3 months to recheck lipid/lfts    Please continue to follow at the weight loss clinic--they are doing virtual visits just like we did today.  When you see them in the actual office, they may be able to adjust your blood pressure medications, if your blood pressure is still running high.  If you are able to get a blood pressure  monitor, please check your blood pressure daily, and keep a list.  Bring the list and the blood pressure monitor to your visit with they kidney doctors (they can check to see how accurate the monitor is).  Your blood pressures on the last 3 visits with the weight loss clinic have been above goal (goal is <130/80). BP Readings from Last 3 Encounters:  08/22/18 (!) 147/68  08/01/18 (!) 144/76  07/21/18 140/68   Please continue to get daily exercise, work on continuing to lose weight, and limit the salt/sodium in your diet. If your blood pressure remains >350 systolic, hopefully the kidney doctor will adjust your medication at your July visit.  Please remind them to send me a copy of their note/visit.  We are going to increase your atorvastatin dose, as your LDL cholesterol is still a little too high.  We are raising it from 44m to 837m If you have a lot of the 4037mablets left at home, you can double up and two of them together.  The new prescription  that is being sent to your pharmacy is for the 65m70mblet (so be sure NOT to take two of the new prescription). Continue to follow a low cholesterol diet.  Low sodium Low cholesterol diet. Follow up with [facility]--virtual visits.  Follow Up Instructions:  Mailed AVS  I discussed the assessment and treatment plan with the patient. The patient was provided an opportunity to ask questions and all were answered. The patient agreed with the plan and demonstrated an understanding of the instructions.   The patient was advised to call back or seek an in-person evaluation if the symptoms worsen or if the condition fails to improve as anticipated.  I provided 31 minutes of non-face-to-face time during this encounter.   Rohan Juenger Vikki Ports

## 2018-11-20 ENCOUNTER — Ambulatory Visit (INDEPENDENT_AMBULATORY_CARE_PROVIDER_SITE_OTHER): Payer: Medicare PPO | Admitting: Family Medicine

## 2018-11-20 ENCOUNTER — Other Ambulatory Visit: Payer: Self-pay

## 2018-11-20 ENCOUNTER — Encounter: Payer: Self-pay | Admitting: Family Medicine

## 2018-11-20 VITALS — Wt 240.0 lb

## 2018-11-20 DIAGNOSIS — N183 Chronic kidney disease, stage 3 unspecified: Secondary | ICD-10-CM

## 2018-11-20 DIAGNOSIS — E1122 Type 2 diabetes mellitus with diabetic chronic kidney disease: Secondary | ICD-10-CM | POA: Diagnosis not present

## 2018-11-20 DIAGNOSIS — E785 Hyperlipidemia, unspecified: Secondary | ICD-10-CM | POA: Diagnosis not present

## 2018-11-20 DIAGNOSIS — Z794 Long term (current) use of insulin: Secondary | ICD-10-CM

## 2018-11-20 DIAGNOSIS — I10 Essential (primary) hypertension: Secondary | ICD-10-CM | POA: Diagnosis not present

## 2018-11-20 DIAGNOSIS — E1121 Type 2 diabetes mellitus with diabetic nephropathy: Secondary | ICD-10-CM

## 2018-11-20 DIAGNOSIS — M109 Gout, unspecified: Secondary | ICD-10-CM | POA: Diagnosis not present

## 2018-11-20 DIAGNOSIS — E1169 Type 2 diabetes mellitus with other specified complication: Secondary | ICD-10-CM | POA: Diagnosis not present

## 2018-11-20 MED ORDER — ATORVASTATIN CALCIUM 80 MG PO TABS: 80.0000 mg | ORAL_TABLET | Freq: Every day | ORAL | 1 refills | Status: DC

## 2018-11-20 NOTE — Patient Instructions (Addendum)
Please continue to follow at the weight loss clinic--they are doing virtual visits just like we did today.  When you see them in the actual office, they may be able to adjust your blood pressure medications, if your blood pressure is still running high.  If you are able to get a blood pressure monitor, please check your blood pressure daily, and keep a list.  Bring the list and the blood pressure monitor to your visit with they kidney doctors (they can check to see how accurate the monitor is).  Your blood pressures on the last 3 visits with the weight loss clinic have been above goal (goal is <130/80). BP Readings from Last 3 Encounters:  08/22/18 (!) 147/68  08/01/18 (!) 144/76  07/21/18 140/68   Please continue to get daily exercise, work on continuing to lose weight, and limit the salt/sodium in your diet. If your blood pressure remains >140 systolic, hopefully the kidney doctor will adjust your medication at your July visit.  Please remind them to send me a copy of their note/visit.  We are going to increase your atorvastatin dose, as your LDL cholesterol is still a little too high.  We are raising it from 40mg  to 80mg . If you have a lot of the 40mg  tablets left at home, you can double up and two of them together.  The new prescription that is being sent to your pharmacy is for the 80mg  tablet (so be sure NOT to take two of the new prescription). Continue to follow a low cholesterol diet.  You said you had plenty of your other medications, so the new (higher) dose of atorvastatin was the only refill I sent today. I'll wait on the request to come from your pharmacy for the others, since it sounds like you have plenty at home right now.  You are due for repeat colonoscopy now.  Be sure to contact Dr. Kenna GilbertMann's office if you don't hear from them by the end of the month.   Fat and Cholesterol Restricted Eating Plan Getting too much fat and cholesterol in your diet may cause health problems.  Choosing the right foods helps keep your fat and cholesterol at normal levels. This can keep you from getting certain diseases.  Meal planning  At meals, divide your plate into four equal parts: ? Fill one-half of your plate with vegetables and green salads. ? Fill one-fourth of your plate with whole grains. ? Fill one-fourth of your plate with low-fat (lean) protein foods.  Eat fish that is high in omega-3 fats at least two times a week. This includes mackerel, tuna, sardines, and salmon.  Eat foods that are high in fiber, such as whole grains, beans, apples, broccoli, carrots, peas, and barley. General tips   Work with your doctor to lose weight if you need to.  Avoid: ? Foods with added sugar. ? Fried foods. ? Foods with partially hydrogenated oils.  Limit alcohol intake to no more than 1 drink a day for nonpregnant women and 2 drinks a day for men. One drink equals 12 oz of beer, 5 oz of wine, or 1 oz of hard liquor. Reading food labels  Check food labels for: ? Trans fats. ? Partially hydrogenated oils. ? Saturated fat (g) in each serving. ? Cholesterol (mg) in each serving. ? Fiber (g) in each serving.  Choose foods with healthy fats, such as: ? Monounsaturated fats. ? Polyunsaturated fats. ? Omega-3 fats.  Choose grain products that have whole grains. Look for  the word "whole" as the first word in the ingredient list. Cooking  Cook foods using low-fat methods. These include baking, boiling, grilling, and broiling.  Eat more home-cooked foods. Eat at restaurants and buffets less often.  Avoid cooking using saturated fats, such as butter, cream, palm oil, palm kernel oil, and coconut oil. Recommended foods  Fruits  All fresh, canned (in natural juice), or frozen fruits. Vegetables  Fresh or frozen vegetables (raw, steamed, roasted, or grilled). Green salads. Grains  Whole grains, such as whole wheat or whole grain breads, crackers, cereals, and pasta.  Unsweetened oatmeal, bulgur, barley, quinoa, or brown rice. Corn or whole wheat flour tortillas. Meats and other protein foods  Ground beef (85% or leaner), grass-fed beef, or beef trimmed of fat. Skinless chicken or [place]. Ground chicken or [place]. Pork trimmed of fat. All fish and seafood. Egg whites. Dried beans, peas, or lentils. Unsalted nuts or seeds. Unsalted canned beans. Nut butters without added sugar or oil. Dairy  Low-fat or nonfat dairy products, such as skim or 1% milk, 2% or reduced-fat cheeses, low-fat and fat-free ricotta or cottage cheese, or plain low-fat and nonfat yogurt. Fats and oils  Tub margarine without trans fats. Light or reduced-fat mayonnaise and salad dressings. Avocado. Olive, canola, sesame, or safflower oils. The items listed above may not be a complete list of foods and beverages you can eat. Contact a dietitian for more information. Foods to avoid Fruits  Canned fruit in heavy syrup. Fruit in cream or butter sauce. Fried fruit. Vegetables  Vegetables cooked in cheese, cream, or butter sauce. Fried vegetables. Grains  White bread. White pasta. White rice. Cornbread. Bagels, pastries, and croissants. Crackers and snack foods that contain trans fat and hydrogenated oils. Meats and other protein foods  Fatty cuts of meat. Ribs, chicken wings, bacon, sausage, bologna, salami, chitterlings, fatback, hot dogs, bratwurst, and packaged lunch meats. Liver and organ meats. Whole eggs and egg yolks. Chicken and [place] with skin. Fried meat. Dairy  Whole or 2% milk, cream, half-and-half, and cream cheese. Whole milk cheeses. Whole-fat or sweetened yogurt. Full-fat cheeses. Nondairy creamers and whipped toppings. Processed cheese, cheese spreads, and cheese curds. Beverages  Alcohol. Sugar-sweetened drinks such as sodas, lemonade, and fruit drinks. Fats and oils  Butter, stick margarine, lard, shortening, ghee, or bacon fat. Coconut, palm kernel, and palm oils.  Sweets and desserts  Corn syrup, sugars, honey, and molasses. Candy. Jam and jelly. Syrup. Sweetened cereals. Cookies, pies, cakes, donuts, muffins, and ice cream. The items listed above may not be a complete list of foods and beverages you should avoid. Contact a dietitian for more information. Summary  Choosing the right foods helps keep your fat and cholesterol at normal levels. This can keep you from getting certain diseases.  At meals, fill one-half of your plate with vegetables and green salads.  Eat high-fiber foods, like whole grains, beans, apples, carrots, peas, and barley.  Limit added sugar, saturated fats, alcohol, and fried foods. This information is not intended to replace advice given to you by your health care provider. Make sure you discuss any questions you have with your health care provider. Document Released: 11/30/2011 Document Revised: 02/01/2018 Document Reviewed: 02/15/2017 Elsevier Interactive Patient Education  2019 [place].  Low-Sodium Eating Plan Sodium, which is an element that makes up salt, helps you maintain a healthy balance of fluids in your body. Too much sodium can increase your blood pressure and cause fluid and waste to be held in your body.  Your health care provider or dietitian may recommend following this plan if you have high blood pressure (hypertension), kidney disease, liver disease, or heart failure. Eating less sodium can help lower your blood pressure, reduce swelling, and protect your heart, liver, and kidneys. What are tips for following this plan? General guidelines  Most people on this plan should limit their sodium intake to 1,500-2,000 mg (milligrams) of sodium each day. Reading food labels   The Nutrition Facts label lists the amount of sodium in one serving of the food. If you eat more than one serving, you must multiply the listed amount of sodium by the number of servings.  Choose foods with less than 140 mg of sodium per  serving.  Avoid foods with 300 mg of sodium or more per serving. Shopping  Look for lower-sodium products, often labeled as "low-sodium" or "no salt added."  Always check the sodium content even if foods are labeled as "unsalted" or "no salt added".  Buy fresh foods. ? Avoid canned foods and premade or frozen meals. ? Avoid canned, cured, or processed meats  Buy breads that have less than 80 mg of sodium per slice. Cooking  Eat more home-cooked food and less restaurant, buffet, and fast food.  Avoid adding salt when cooking. Use salt-free seasonings or herbs instead of table salt or sea salt. Check with your health care provider or pharmacist before using salt substitutes.  Cook with plant-based oils, such as canola, sunflower, or olive oil. Meal planning  When eating at a restaurant, ask that your food be prepared with less salt or no salt, if possible.  Avoid foods that contain MSG (monosodium glutamate). MSG is sometimes added to [place] food, bouillon, and some canned foods. What foods are recommended? The items listed may not be a complete list. Talk with your dietitian about what dietary choices are best for you. Grains Low-sodium cereals, including oats, puffed wheat and rice, and shredded wheat. Low-sodium crackers. Unsalted rice. Unsalted pasta. Low-sodium bread. Whole-grain breads and whole-grain pasta. Vegetables Fresh or frozen vegetables. "No salt added" canned vegetables. "No salt added" tomato sauce and paste. Low-sodium or reduced-sodium tomato and vegetable juice. Fruits Fresh, frozen, or canned fruit. Fruit juice. Meats and other protein foods Fresh or frozen (no salt added) meat, poultry, seafood, and fish. Low-sodium canned tuna and salmon. Unsalted nuts. Dried peas, beans, and lentils without added salt. Unsalted canned beans. Eggs. Unsalted nut butters. Dairy Milk. Soy milk. Cheese that is naturally low in sodium, such as ricotta cheese, fresh mozzarella, or  Swiss cheese Low-sodium or reduced-sodium cheese. Cream cheese. Yogurt. Fats and oils Unsalted butter. Unsalted margarine with no trans fat. Vegetable oils such as canola or olive oils. Seasonings and other foods Fresh and dried herbs and spices. Salt-free seasonings. Low-sodium mustard and ketchup. Sodium-free salad dressing. Sodium-free light mayonnaise. Fresh or refrigerated horseradish. Lemon juice. Vinegar. Homemade, reduced-sodium, or low-sodium soups. Unsalted popcorn and pretzels. Low-salt or salt-free chips. What foods are not recommended? The items listed may not be a complete list. Talk with your dietitian about what dietary choices are best for you. Grains Instant hot cereals. Bread stuffing, pancake, and biscuit mixes. Croutons. Seasoned rice or pasta mixes. Noodle soup cups. Boxed or frozen macaroni and cheese. Regular salted crackers. Self-rising flour. Vegetables Sauerkraut, pickled vegetables, and relishes. Olives. [place] fries. Onion rings. Regular canned vegetables (not low-sodium or reduced-sodium). Regular canned tomato sauce and paste (not low-sodium or reduced-sodium). Regular tomato and vegetable juice (not low-sodium or reduced-sodium). Frozen  vegetables in sauces. Meats and other protein foods Meat or fish that is salted, canned, smoked, spiced, or pickled. Bacon, ham, sausage, hotdogs, corned beef, chipped beef, packaged lunch meats, salt pork, jerky, pickled herring, anchovies, regular canned tuna, sardines, salted nuts. Dairy Processed cheese and cheese spreads. Cheese curds. Blue cheese. Feta cheese. String cheese. Regular cottage cheese. Buttermilk. Canned milk. Fats and oils Salted butter. Regular margarine. Ghee. Bacon fat. Seasonings and other foods Onion salt, garlic salt, seasoned salt, table salt, and sea salt. Canned and packaged gravies. Worcestershire sauce. Tartar sauce. Barbecue sauce. Teriyaki sauce. Soy sauce, including reduced-sodium. Steak sauce. Fish  sauce. Oyster sauce. Cocktail sauce. Horseradish that you find on the shelf. Regular ketchup and mustard. Meat flavorings and tenderizers. Bouillon cubes. Hot sauce and Tabasco sauce. Premade or packaged marinades. Premade or packaged taco seasonings. Relishes. Regular salad dressings. Salsa. Potato and tortilla chips. Corn chips and puffs. Salted popcorn and pretzels. Canned or dried soups. Pizza. Frozen entrees and pot pies. Summary  Eating less sodium can help lower your blood pressure, reduce swelling, and protect your heart, liver, and kidneys.  Most people on this plan should limit their sodium intake to 1,500-2,000 mg (milligrams) of sodium each day.  Canned, boxed, and frozen foods are high in sodium. Restaurant foods, fast foods, and pizza are also very high in sodium. You also get sodium by adding salt to food.  Try to cook at home, eat more fresh fruits and vegetables, and eat less fast food, canned, processed, or prepared foods. This information is not intended to replace advice given to you by your health care provider. Make sure you discuss any questions you have with your health care provider. Document Released: 11/20/2001 Document Revised: 05/24/2016 Document Reviewed: 05/24/2016 Elsevier Interactive Patient Education  2019 [employer]

## 2018-11-20 NOTE — Progress Notes (Signed)
done

## 2018-11-22 ENCOUNTER — Other Ambulatory Visit: Payer: Self-pay | Admitting: Family Medicine

## 2018-11-22 DIAGNOSIS — I10 Essential (primary) hypertension: Secondary | ICD-10-CM

## 2018-11-22 DIAGNOSIS — N183 Chronic kidney disease, stage 3 unspecified: Secondary | ICD-10-CM

## 2018-11-22 DIAGNOSIS — E1122 Type 2 diabetes mellitus with diabetic chronic kidney disease: Secondary | ICD-10-CM

## 2018-12-05 DIAGNOSIS — H04123 Dry eye syndrome of bilateral lacrimal glands: Secondary | ICD-10-CM | POA: Diagnosis not present

## 2018-12-05 DIAGNOSIS — Z961 Presence of intraocular lens: Secondary | ICD-10-CM | POA: Diagnosis not present

## 2018-12-05 DIAGNOSIS — H401131 Primary open-angle glaucoma, bilateral, mild stage: Secondary | ICD-10-CM | POA: Diagnosis not present

## 2018-12-05 DIAGNOSIS — H353121 Nonexudative age-related macular degeneration, left eye, early dry stage: Secondary | ICD-10-CM | POA: Diagnosis not present

## 2018-12-05 DIAGNOSIS — E113293 Type 2 diabetes mellitus with mild nonproliferative diabetic retinopathy without macular edema, bilateral: Secondary | ICD-10-CM | POA: Diagnosis not present

## 2018-12-05 LAB — HM DIABETES EYE EXAM

## 2018-12-07 ENCOUNTER — Encounter: Payer: Self-pay | Admitting: *Deleted

## 2018-12-12 ENCOUNTER — Ambulatory Visit
Admission: RE | Admit: 2018-12-12 | Discharge: 2018-12-12 | Disposition: A | Discharge status: Home / Self Care | Payer: Medicare PPO | Source: Ambulatory Visit | Attending: Family Medicine | Admitting: Family Medicine

## 2018-12-12 ENCOUNTER — Other Ambulatory Visit: Payer: Self-pay

## 2018-12-12 DIAGNOSIS — Z1231 Encounter for screening mammogram for malignant neoplasm of breast: Secondary | ICD-10-CM | POA: Diagnosis not present

## 2018-12-25 DIAGNOSIS — N183 Chronic kidney disease, stage 3 (moderate): Secondary | ICD-10-CM | POA: Diagnosis not present

## 2018-12-25 DIAGNOSIS — I129 Hypertensive chronic kidney disease with stage 1 through stage 4 chronic kidney disease, or unspecified chronic kidney disease: Secondary | ICD-10-CM | POA: Diagnosis not present

## 2018-12-25 DIAGNOSIS — E1122 Type 2 diabetes mellitus with diabetic chronic kidney disease: Secondary | ICD-10-CM | POA: Diagnosis not present

## 2018-12-25 DIAGNOSIS — E559 Vitamin D deficiency, unspecified: Secondary | ICD-10-CM | POA: Diagnosis not present

## 2019-01-22 ENCOUNTER — Other Ambulatory Visit: Payer: Self-pay

## 2019-01-22 ENCOUNTER — Other Ambulatory Visit: Payer: Medicare PPO

## 2019-01-22 DIAGNOSIS — E785 Hyperlipidemia, unspecified: Secondary | ICD-10-CM | POA: Diagnosis not present

## 2019-01-22 DIAGNOSIS — E1169 Type 2 diabetes mellitus with other specified complication: Secondary | ICD-10-CM | POA: Diagnosis not present

## 2019-01-23 LAB — LIPID PANEL
Chol/HDL Ratio: 4 ratio (ref 0.0–4.4)
Cholesterol, Total: 168 mg/dL (ref 100–199)
HDL: 42 mg/dL (ref >39)
LDL Calculated: 99 mg/dL (ref 0–99)
Triglycerides: 135 mg/dL (ref 0–149)
VLDL Cholesterol Cal: 27 mg/dL (ref 5–40)

## 2019-01-23 LAB — HEPATIC FUNCTION PANEL
ALT: 12 IU/L (ref 0–32)
AST: 14 IU/L (ref 0–40)
Albumin: 4.2 g/dL (ref 3.7–4.7)
Alkaline Phosphatase: 84 IU/L (ref 39–117)
Bilirubin Total: 0.3 mg/dL (ref 0.0–1.2)
Bilirubin, Direct: 0.1 mg/dL (ref 0.00–0.40)
Total Protein: 6.4 g/dL (ref 6.0–8.5)

## 2019-01-31 ENCOUNTER — Other Ambulatory Visit: Payer: Self-pay | Admitting: Family Medicine

## 2019-03-21 ENCOUNTER — Other Ambulatory Visit: Payer: Self-pay | Admitting: *Deleted

## 2019-03-21 DIAGNOSIS — E1122 Type 2 diabetes mellitus with diabetic chronic kidney disease: Secondary | ICD-10-CM

## 2019-03-21 DIAGNOSIS — N183 Chronic kidney disease, stage 3 unspecified: Secondary | ICD-10-CM

## 2019-03-21 MED ORDER — INSULIN GLARGINE 100 UNIT/ML ~~LOC~~ SOLN: SUBCUTANEOUS | 2 refills | Status: DC

## 2019-04-04 ENCOUNTER — Other Ambulatory Visit: Payer: Self-pay | Admitting: Family Medicine

## 2019-04-04 DIAGNOSIS — E785 Hyperlipidemia, unspecified: Secondary | ICD-10-CM

## 2019-04-04 DIAGNOSIS — E1169 Type 2 diabetes mellitus with other specified complication: Secondary | ICD-10-CM

## 2019-06-06 ENCOUNTER — Other Ambulatory Visit: Payer: Self-pay | Admitting: Family Medicine

## 2019-06-06 DIAGNOSIS — H43813 Vitreous degeneration, bilateral: Secondary | ICD-10-CM | POA: Diagnosis not present

## 2019-06-06 DIAGNOSIS — H401132 Primary open-angle glaucoma, bilateral, moderate stage: Secondary | ICD-10-CM | POA: Diagnosis not present

## 2019-06-06 DIAGNOSIS — H04123 Dry eye syndrome of bilateral lacrimal glands: Secondary | ICD-10-CM | POA: Diagnosis not present

## 2019-06-06 DIAGNOSIS — E119 Type 2 diabetes mellitus without complications: Secondary | ICD-10-CM | POA: Diagnosis not present

## 2019-06-06 DIAGNOSIS — Z961 Presence of intraocular lens: Secondary | ICD-10-CM | POA: Diagnosis not present

## 2019-06-06 DIAGNOSIS — E785 Hyperlipidemia, unspecified: Secondary | ICD-10-CM

## 2019-06-06 DIAGNOSIS — H353121 Nonexudative age-related macular degeneration, left eye, early dry stage: Secondary | ICD-10-CM | POA: Diagnosis not present

## 2019-06-06 DIAGNOSIS — E1169 Type 2 diabetes mellitus with other specified complication: Secondary | ICD-10-CM

## 2019-06-06 LAB — HM DIABETES EYE EXAM

## 2019-06-11 ENCOUNTER — Other Ambulatory Visit: Payer: Self-pay | Admitting: Family Medicine

## 2019-06-12 NOTE — Progress Notes (Signed)
Chief Complaint  Patient presents with  . Medicare Wellness    fasting AWV/CPE/med check. No concerns. Patient did get her HD flu shot at [facility] end of Sept-abstracted. She said her left ear is doing better(looked like still had wax in there to me). And did not see Dr. Collene Mares this year.     Kerri Richardson is a 78 y.o. female who presents for annual physical exam, Medicare wellness visit and follow-up on chronic medical conditions.  She sees Dr. Quincy Simmonds for Well Woman exam, last done 07/2018.  She had been [facility], last seen in March.  At that time herweight is 237 lb (107.5 kg), and had lost a total of 49lbs. She hasn't been back since then.  She has gone back to some of her old ways, and has regained weight. She is eating more sandwiches (not getting the same low calorie bread as she was, since hasn't been going to [place] since pandemic).  She changed back to her regular dressing (not the one she used when going to [facility]).  Diabetes follow-up:Diabetes has been well controlled. Her Lantus dose had been titrated down to 12 U at her visit in March.  She has increased this back up to 15 U daily.  Sugars have been running 78-114 in the mornings.  This morning was 96.  Doesn't check sugars later in the day, just once last week and it was 144. She denies hypoglycemia, polydipsia or polyuria.  She has urinary frequency at night (every 2 hours), not as often during the day. Last eye examwas 05/2019.Patient follows a low sugar diet and checks feet regularly without concerns. Lab Results  Component Value Date   HGBA1C 6.1 (H) 11/15/2018   Hyperlipidemia follow-up: Patient is reportedly following a low-fat, low cholesterol diet. Compliant with medications (atorvastatin) and denies medication side effects. Her atorvastatin dose was increased from 49m to 893min June, after LDL was 107. Repeat was below 100, but only slightly. Lab Results  Component  Value Date   CHOL 168 01/22/2019   HDL 42 01/22/2019   LDLCALC 99 01/22/2019   TRIG 135 01/22/2019   CHOLHDL 4.0 01/22/2019   Hypertension follow-up:  Blood pressure at home yesterday was 156/70's.  She states that this new arm monitor (which has not been verified) is extremely painful.  She doesn't check often, due to it hurting her. Denies dizziness, headaches, chest pain. Denies side effects of medications. She reports compliance with amlodipine and enalapril (both 1062maily). She used to walk back and forth around her house for exercise, twice throughout the day. She hasn't done this since September, no exercise.  Vitamin D deficiency--this was being monitored and treated by the weight loss clinic. Last level was normal at 34.9 in 06/2018.  Looks like she was last prescribed weekly rx for #10 pills in 07/2018. She is currently taking Vitamin D 2000 IU daily.  H/o toe pain--goutand degenerative changes. On allopurinol for prevention, with colchicine for prn use(hasn't needed in a long time).Only on 100m36mallopurinol(dose not increased due to impaired Cr and lack of gout symptoms on this dose).Denies any pain or swelling. Lab Results  Component Value Date   LABURIC 6.3 05/18/2018   CKD--She saw Dr. LoriHarrie JeansJuly 2020 (Dr. PoweFlorene Glenired).  Last Cr with them was 1.39.  Lasix was decreased to 1/2 tablet (20mg64mree times/week (prev took daily). She reports that she had recurrent swelling on the days she  didn't take it, so is back to taking it every day (and did not contact the doctor who recommended the change).  H/o colon polyps, noted last 11/2013.  She is due for 5 year f/u with Dr. Collene Mares. She never got notified by their office.   Immunization History  Administered Date(s) Administered  . Influenza Split 05/05/2011, 03/23/2012  . Influenza, High Dose Seasonal PF 04/03/2014, 02/20/2015, 03/29/2016, 03/11/2017, 04/28/2018  . Pneumococcal Conjugate-13 12/10/2013  .  Pneumococcal Polysaccharide-23 01/26/2010  . Tdap 10/19/2007, 06/12/2018  . Zoster 10/19/2007   Got flu shot at pharmacy Last Pap smear:07/2017, normal, no high risk HPV (by Dr. Quincy Simmonds).S/p LEEP for +HR HPV, final pathology LGSILin 05/2015 with Dr. Quincy Simmonds. Last mammogram:11/2018 Last colonoscopy: 11/26/13, +polyp, due again in 5 years Last DEXA: 01/2010--normal Ophtho:twice yearly Dentist: has dentures, and just one tooth; last seen 02/2013 (got new dentures) Exercise: None since September.  Previously would walk back and forth around her house for exercise.   Other doctors caring for patient include: Ophtho: Dr. [name] Nephro: Dr. Harrie Jeans GI: Dr. Collene Mares GYN: Dr. Quincy Simmonds Ortho: Dr. Tonita Cong Weight Loss: Drs. Beasley/Kadolph  Depression screen: Negative Fall screen: negative Functional Status Survery: Notable forsome decrease in hearing, not worse than last year. Some pain walking due to right knee pain (uses cane when walking far) Mini-Cog screen: normal See full screens in epic  End of Life Discussion: Patient has a living will and medical power of attorney. She did it 06/2018, but keeps forgetting to bring it.  PMH, PSH, SH and FH were updated and reviewed.  Outpatient Encounter Medications as of 06/13/2019  Medication Sig Note  . ACCU-CHEK AVIVA PLUS test strip TEST BLOOD SUGAR TWICE DAILY   . Accu-Chek Softclix Lancets lancets TEST BLOOD SUGAR TWICE DAILY   . allopurinol (ZYLOPRIM) 100 MG tablet TAKE 1 TABLET EVERY DAY   . amLODipine (NORVASC) 10 MG tablet Take 1 tablet (10 mg total) by mouth daily.   Marland Kitchen aspirin 81 MG tablet Take 81 mg by mouth daily.     Marland Kitchen atorvastatin (LIPITOR) 80 MG tablet TAKE 1 TABLET EVERY DAY   . Blood Glucose Monitoring Suppl (ACCU-CHEK NANO SMARTVIEW) w/Device KIT 1 each by Does not apply route 2 (two) times daily. BID   . brimonidine (ALPHAGAN) 0.2 % ophthalmic solution 1 drop 2 (two) times daily.   . dorzolamide-timolol (COSOPT) 22.3-6.8  MG/ML ophthalmic solution 1 drop 2 (two) times daily.   . DROPLET INSULIN SYRINGE 31G X 5/16" 0.5 ML MISC INJECT EVERY DAY   . enalapril (VASOTEC) 10 MG tablet TAKE 1 TABLET EVERY DAY   . furosemide (LASIX) 20 MG tablet Take 20 mg by mouth daily.  06/13/2019: Takes 1/2 tablet every day  . insulin glargine (LANTUS) 100 UNIT/ML injection INJECT 20 UNITS EVERY NIGHT AT BEDTIME (DISCARD OPEN VIAL 28 DAYS AFTER FIRST OPEN) 06/13/2019: Currently using 15 U nightly  . pioglitazone (ACTOS) 30 MG tablet TAKE 1 TABLET EVERY DAY   . VITAMIN D PO Take 2,000 Int'l Units by mouth daily.   Marland Kitchen latanoprost (XALATAN) 0.005 % ophthalmic solution Place 1 drop into both eyes at bedtime.     . [DISCONTINUED] Accu-Chek Softclix Lancets lancets TEST BLOOD SUGAR TWICE DAILY   . [DISCONTINUED] atorvastatin (LIPITOR) 80 MG tablet TAKE 1 TABLET EVERY DAY   . [DISCONTINUED] Vitamin D, Ergocalciferol, (DRISDOL) 1.25 MG (50000 UT) CAPS capsule Take 1 capsule (50,000 Units total) by mouth every 3 (three) days. (Patient not taking: Reported on 11/20/2018) 11/20/2018:  She finished the prescription.  She has been taking 2000 IU daily   No facility-administered encounter medications on file as of 06/13/2019.   No Known Allergies   ROS: The patient denies anorexia, fever, headaches, vision changes, ear pain, sore throat, breast concerns, chest pain, palpitations, dizziness, syncope, dyspnea on exertion, cough, nausea, vomiting, diarrhea, constipation, abdominal pain, melena, hematochezia, indigestion/heartburn, hematuria, incontinence, dysuria, vaginal bleeding, discharge, odor or itch, genital lesions, joint pains(occasional right knee pain;slight right great toe pain last week,very mild); deniesnumbness, tingling, weakness, tremor, suspicious skin lesions, depression, anxiety, abnormal bleeding/bruising (some bruising, on aspirin), or enlarged lymph nodes. Right knee pain with walking only, no swelling or giving way. Has noted  some hearing loss(TV is loud), not significantly different over the last year. Denies any worsening. Some swelling in her feet only when she sits too long Urinating at night, up every 2 hours, normal size voids. No frequency during day. Left shoulder pain for a few months, occasionally during the day. 18# gain since her last visit    PHYSICAL EXAM:  BP 140/70   Pulse 84   Temp (!) 96.1 F (35.6 C) (Other (Comment))   Ht _0  (1.575 m)   Wt 258 lb 12.8 oz (117.4 kg)   LMP 06/14/1998 (Approximate)   BMI 47.34 kg/m   Wt Readings from Last 3 Encounters:  06/13/19 258 lb 12.8 oz (117.4 kg)  11/20/18 240 lb (108.9 kg)  08/22/18 237 lb (107.5 kg)    Weight was 248# at her CPE 05/2018, and 285# 6.4oz at CPE 05/2017  General Appearance:   Alert, cooperative, no distress, appears stated age  Head:   Normocephalic, without obvious abnormality, atraumatic  Eyes:   PERRL, conjunctiva/corneas clear, EOM's intact, fundi not well visualized  Ears:   Normal TM's and external ear canals. Partially obstructive cerumen noted on the left.  Nose:  Not examined, wearing mask due to COVID-19 pandemic  Throat:  Not examined, wearing mask due to COVID-19 pandemic  Neck:  Supple, no lymphadenopathy; thyroid: no enlargement/ tenderness/nodules; no carotid bruit or JVD  Back:  Spine nontender, no curvature, ROM normal, no CVAtenderness  Lungs:   Clear to auscultation bilaterally without wheezes, rales orronchi; respirations unlabored  Chest Wall:   No tenderness or deformity  Heart:   Regular rate and rhythm, S1 and S2 normal, no murmur, rub or gallop  Breast Exam:   deferred to GYN  Abdomen:   Soft, obese, non-tender, nondistended, normoactive bowel sounds, no masses, no hepatosplenomegaly.  Genitalia:   deferred to GYN  Rectal:   deferred to GYN  Extremities:  No clubbing, cyanosis. No edema. Normal sensation to light touch. Thickened/discolored  great toenails bilaterally  Pulses:  2+ and symmetric all extremities  Skin:  Skin color, texture, turgor normal, no rashes or lesions  Lymph nodes:  Cervical, supraclavicular nodes normal  Neurologic:  Normal strength, sensation and gait; reflexes 2+ and symmetric throughout  Psych: Normal mood, affect, hygiene and grooming   Normal diabetic foot exam  Lab Results  Component Value Date   HGBA1C 6.2 (A) 06/13/2019    ASSESSMENT/PLAN:  Annual physical exam - Plan: Uric acid, Urine Microalbumin w/creat. ratio, TSH, Vitamin D 25 hydroxy, Comprehensive metabolic panel, Lipid panel  Encounter for Medicare annual wellness exam  Essential hypertension - borderline today; higher at home w/unverified cuff (and causes pain). Counseled re: exercise, wt loss; cont low Na diet. Cont current meds - Plan: Comprehensive metabolic panel  Type 2 diabetes mellitus with  stage 3 chronic kidney disease, with long-term current use of insulin, unspecified whether stage 3a or 3b CKD ([place]) - DM is well controlled.  Needed to increase insulin dose since change in diet and wt gain. Discussed cutting back carbs, exercise, wt loss - Plan: HgB A1c, Urine Microalbumin w/creat. ratio, TSH, Comprehensive metabolic panel  Gout involving toe of right foot, unspecified cause, unspecified chronicity - no recent issues; on allopurinol - Plan: Uric acid  Vitamin D deficiency - Plan: Vitamin D 25 hydroxy  Mixed hyperlipidemia - Plan: Lipid panel  Medication monitoring encounter - Plan: Uric acid, Urine Microalbumin w/creat. ratio, TSH, Vitamin D 25 hydroxy, Comprehensive metabolic panel, Lipid panel  Class 3 severe obesity with serious comorbidity and body mass index (BMI) of 45.0 to 49.9 in adult, unspecified obesity type ([place]) - counseled re: diet--cut back carbs, low calorie dressings, exercise. Consider restart [facility] clinic   Uric acid, vit D, lipid, c-met, TSH, Urine microalbumin (CBC  fine at nephro 12/2018)  (Patient states she has "too much" medicine at home, not aware of being out of anything currently. Based on info from refills in computer, should have needed refills a long time ago.)   Discussed monthly self breast exams and yearly mammograms; at least 30 minutes of aerobic activity at least 5 days/week, weight-bearing exercise 2x/week; proper sunscreen use reviewed; healthy diet, including goals of calcium and vitamin D intake and alcohol recommendations (less than or equal to 1 drink/day) reviewed; regular seatbelt use; changing batteries in smoke detectors. Immunization recommendations discussed--continueyearly high dose flu shots.Shingrix recommended, to get at pharmacy, risks SE reviewed. COVID vaccine recommended when available. Colonoscopy recommendations reviewed--was due 11/2018. Pt to contact Dr. Lorie Apley office.  MOST form reviewed/updated today. Full Code, Full Care. Living Will and Healthcare Power of Attorney--reminded to get [name] copies of completed forms.   F/u 6 months, sooner prn  Medicare Attestation I have personally reviewed: The patient's medical and social history Their use of alcohol, tobacco or illicit drugs Their current medications and supplements The patient's functional ability including ADLs,fall risks, home safety risks, cognitive, and hearing and visual impairment Diet and physical activities Evidence for depression or mood disorders  The patient's weight, height, BMI have been recorded in the chart.  I have made referrals, counseling, and provided education to the patient based on review of the above and I have provided the patient with a written personalized care plan for preventive services.

## 2019-06-12 NOTE — Patient Instructions (Addendum)
HEALTH MAINTENANCE RECOMMENDATIONS:  It is recommended that you get at least 30 minutes of aerobic exercise at least 5 days/week (for weight loss, you may need as much as 60-90 minutes). This can be any activity that gets your heart rate up. This can be divided in 10-15 minute intervals if needed, but try and build up your endurance at least once a week.  Weight bearing exercise is also recommended twice weekly.  Eat a healthy diet with lots of vegetables, fruits and fiber.  "Colorful" foods have a lot of vitamins (ie green vegetables, tomatoes, red peppers, etc).  Limit sweet tea, regular sodas and alcoholic beverages, all of which has a lot of calories and sugar.  Up to 1 alcoholic drink daily may be beneficial for women (unless trying to lose weight, watch sugars).  Drink a lot of water.  Calcium recommendations are 1200-1500 mg daily (1500 mg for postmenopausal women or women without ovaries), and vitamin D 1000 IU daily.  This should be obtained from diet and/or supplements (vitamins), and calcium should not be taken all at once, but in divided doses.  Monthly self breast exams and yearly mammograms for women over the age of 74 is recommended.  Sunscreen of at least SPF 30 should be used on all sun-exposed parts of the skin when outside between the hours of 10 am and 4 pm (not just when at beach or pool, but even with exercise, golf, tennis, and yard work!)  Use a sunscreen that says "broad spectrum" so it covers both UVA and UVB rays, and make sure to reapply every 1-2 hours.  Remember to change the batteries in your smoke detectors when changing your clock times in the spring and fall. Carbon monoxide detectors are recommended for your home.  Use your seat belt every time you are in a car, and please drive safely and not be distracted with cell phones and texting while driving.   Kerri Richardson , Thank you for taking time to come for your Medicare Wellness Visit. I appreciate your ongoing  commitment to your health goals. Please review the following plan we discussed and let me know if I can assist you in the future.    This is a list of the screening recommended for you and due dates:  Health Maintenance  Topic Date Due  . Flu Shot  01/13/2019  . Hemoglobin A1C  05/17/2019  . Complete foot exam   05/19/2019  . Eye exam for diabetics  06/05/2020  . Tetanus Vaccine  06/12/2028  . DEXA scan (bone density measurement)  Completed  . Pneumonia vaccines  Completed   A1c and foot exams were done today. Continue yearly high dose flu shots (okay to get from the pharmacy as you did). You were given a pneumonia vaccine booster today.  You are due for your colonoscopy, please contact Dr. Lorie Apley office to schedule.  Continue yearly mammograms.  I recommend getting the new shingles vaccine (Shingrix). You will need to get this from the pharmacy rather than our office, as it is covered by Medicare Part D.  It is a series of 2 injections, spaced 2 months apart.  COVID vaccine is recommended when available. Keep on eye on the news as to when/where to get this.  Please get [name] copies of your living will and healthcare power of attorney at your convenience.  We discussed that you have regained weight.  It appears that you have gone back to some of your "old ways"--eating more bread,  back to your prior salad dressings, and no longer getting any regular exercise.  Please try and go back to how the weight loss clinic advised you regarding your diet--eating more protein, fewer carbs, and getting regular exercise. Consider restarting with their clinic as well.  Getting on the scale weekly might also help reinforce things.  Please let your kidney doctor know that you are needing to take the lasix every day (to prevent swelling), since she had recommended you cut back.

## 2019-06-13 ENCOUNTER — Ambulatory Visit (INDEPENDENT_AMBULATORY_CARE_PROVIDER_SITE_OTHER): Payer: Medicare PPO | Admitting: Family Medicine

## 2019-06-13 ENCOUNTER — Other Ambulatory Visit: Payer: Self-pay

## 2019-06-13 ENCOUNTER — Encounter: Payer: Self-pay | Admitting: Family Medicine

## 2019-06-13 VITALS — BP 140/70 | HR 84 | Temp 96.1°F | Ht 62.0 in | Wt 258.8 lb

## 2019-06-13 DIAGNOSIS — N183 Chronic kidney disease, stage 3 unspecified: Secondary | ICD-10-CM

## 2019-06-13 DIAGNOSIS — Z5181 Encounter for therapeutic drug level monitoring: Secondary | ICD-10-CM

## 2019-06-13 DIAGNOSIS — Z0000 Encounter for general adult medical examination without abnormal findings: Secondary | ICD-10-CM

## 2019-06-13 DIAGNOSIS — E782 Mixed hyperlipidemia: Secondary | ICD-10-CM

## 2019-06-13 DIAGNOSIS — I10 Essential (primary) hypertension: Secondary | ICD-10-CM

## 2019-06-13 DIAGNOSIS — E1122 Type 2 diabetes mellitus with diabetic chronic kidney disease: Secondary | ICD-10-CM

## 2019-06-13 DIAGNOSIS — E559 Vitamin D deficiency, unspecified: Secondary | ICD-10-CM

## 2019-06-13 DIAGNOSIS — M109 Gout, unspecified: Secondary | ICD-10-CM | POA: Diagnosis not present

## 2019-06-13 DIAGNOSIS — Z794 Long term (current) use of insulin: Secondary | ICD-10-CM | POA: Diagnosis not present

## 2019-06-13 DIAGNOSIS — Z6841 Body Mass Index (BMI) 40.0 and over, adult: Secondary | ICD-10-CM

## 2019-06-13 LAB — POCT GLYCOSYLATED HEMOGLOBIN (HGB A1C): Hemoglobin A1C: 6.2 % — AB (ref 4.0–5.6)

## 2019-06-14 LAB — LIPID PANEL
Chol/HDL Ratio: 4.2 ratio (ref 0.0–4.4)
Cholesterol, Total: 197 mg/dL (ref 100–199)
HDL: 47 mg/dL (ref >39)
LDL Chol Calc (NIH): 126 mg/dL — ABNORMAL HIGH (ref 0–99)
Triglycerides: 137 mg/dL (ref 0–149)
VLDL Cholesterol Cal: 24 mg/dL (ref 5–40)

## 2019-06-14 LAB — MICROALBUMIN / CREATININE URINE RATIO
Creatinine, Urine: 163 mg/dL
Microalb/Creat Ratio: 7 mg/g creat (ref 0–29)
Microalbumin, Urine: 11.3 ug/mL

## 2019-06-14 LAB — COMPREHENSIVE METABOLIC PANEL
ALT: 12 IU/L (ref 0–32)
AST: 17 IU/L (ref 0–40)
Albumin/Globulin Ratio: 1.6 (ref 1.2–2.2)
Albumin: 4.5 g/dL (ref 3.7–4.7)
Alkaline Phosphatase: 90 IU/L (ref 39–117)
BUN/Creatinine Ratio: 28 (ref 12–28)
BUN: 48 mg/dL — ABNORMAL HIGH (ref 8–27)
Bilirubin Total: 0.3 mg/dL (ref 0.0–1.2)
CO2: 21 mmol/L (ref 20–29)
Calcium: 9.4 mg/dL (ref 8.7–10.3)
Chloride: 98 mmol/L (ref 96–106)
Creatinine, Ser: 1.72 mg/dL — ABNORMAL HIGH (ref 0.57–1.00)
GFR calc Af Amer: 33 mL/min/{1.73_m2} — ABNORMAL LOW (ref >59)
GFR calc non Af Amer: 28 mL/min/{1.73_m2} — ABNORMAL LOW (ref >59)
Globulin, Total: 2.8 g/dL (ref 1.5–4.5)
Glucose: 126 mg/dL — ABNORMAL HIGH (ref 65–99)
Potassium: 4.5 mmol/L (ref 3.5–5.2)
Sodium: 137 mmol/L (ref 134–144)
Total Protein: 7.3 g/dL (ref 6.0–8.5)

## 2019-06-14 LAB — VITAMIN D 25 HYDROXY (VIT D DEFICIENCY, FRACTURES): Vit D, 25-Hydroxy: 27.9 ng/mL — ABNORMAL LOW (ref 30.0–100.0)

## 2019-06-14 LAB — URIC ACID: Uric Acid: 7.1 mg/dL (ref 3.1–7.9)

## 2019-06-14 LAB — TSH: TSH: 4.11 u[IU]/mL (ref 0.450–4.500)

## 2019-06-14 NOTE — Progress Notes (Signed)
Please have her check her bottles and verify that she is still taking allopurinol.  Her uric acid was higher than on last check (and based on refills she should have been out).  If she hasn't been taking, refill x 1 year.  If she has been, and not missed any, may be related to change in diet, and we should send low purine diet info again. Her Vitamin D level was again low, the 2000 IU she is taking isn't enough to keep it in the normal range.  Have her double up to 4000 IU for the rest of the bottle, and when she needs to buy more, have her buy the 5000 IU dose from the store. Her cholesterol is much higher than last time-LDL is now 126 (is supposed to be under 100, and last time was 99).  See if perhaps she missed any doses. If she didn't, then we either need to add zetia 10mg  once daliy to take along with the 80mg  of atorvastatin, or we can try changing to 40mg  of crestor to see if it might work better.  Whichever she prefers, give x 3 mos, and needs to be set up for fasting lipids and LFTs in 2-3 months, with the potential for an office visit after if values aren't good. Kidney tests bumped back up--ensure that she is drinking enough water (such that her urine color is very light/pale).

## 2019-06-18 ENCOUNTER — Other Ambulatory Visit: Payer: Self-pay | Admitting: *Deleted

## 2019-06-18 DIAGNOSIS — Z5181 Encounter for therapeutic drug level monitoring: Secondary | ICD-10-CM

## 2019-06-18 DIAGNOSIS — E783 Hyperchylomicronemia: Secondary | ICD-10-CM

## 2019-06-18 MED ORDER — EZETIMIBE 10 MG PO TABS: 10.0000 mg | ORAL_TABLET | Freq: Every day | ORAL | 0 refills | Status: DC

## 2019-07-11 ENCOUNTER — Other Ambulatory Visit: Payer: Self-pay | Admitting: Family Medicine

## 2019-07-11 DIAGNOSIS — E119 Type 2 diabetes mellitus without complications: Secondary | ICD-10-CM

## 2019-07-23 ENCOUNTER — Other Ambulatory Visit: Payer: Self-pay | Admitting: *Deleted

## 2019-07-23 DIAGNOSIS — M109 Gout, unspecified: Secondary | ICD-10-CM

## 2019-07-23 MED ORDER — ALLOPURINOL 100 MG PO TABS: 100.0000 mg | ORAL_TABLET | Freq: Every day | ORAL | 1 refills | Status: DC

## 2019-07-24 ENCOUNTER — Other Ambulatory Visit: Payer: Self-pay

## 2019-07-24 ENCOUNTER — Ambulatory Visit: Payer: Medicare PPO | Admitting: Sports Medicine

## 2019-07-24 ENCOUNTER — Ambulatory Visit (INDEPENDENT_AMBULATORY_CARE_PROVIDER_SITE_OTHER): Payer: Medicare PPO

## 2019-07-24 ENCOUNTER — Encounter: Payer: Self-pay | Admitting: Sports Medicine

## 2019-07-24 VITALS — Temp 96.7°F

## 2019-07-24 DIAGNOSIS — M79675 Pain in left toe(s): Secondary | ICD-10-CM

## 2019-07-24 DIAGNOSIS — M79672 Pain in left foot: Secondary | ICD-10-CM | POA: Diagnosis not present

## 2019-07-24 DIAGNOSIS — S90112A Contusion of left great toe without damage to nail, initial encounter: Secondary | ICD-10-CM | POA: Diagnosis not present

## 2019-07-24 DIAGNOSIS — I739 Peripheral vascular disease, unspecified: Secondary | ICD-10-CM | POA: Diagnosis not present

## 2019-07-24 DIAGNOSIS — B351 Tinea unguium: Secondary | ICD-10-CM

## 2019-07-24 DIAGNOSIS — E1122 Type 2 diabetes mellitus with diabetic chronic kidney disease: Secondary | ICD-10-CM

## 2019-07-24 DIAGNOSIS — N183 Chronic kidney disease, stage 3 unspecified: Secondary | ICD-10-CM | POA: Diagnosis not present

## 2019-07-24 DIAGNOSIS — M79674 Pain in right toe(s): Secondary | ICD-10-CM

## 2019-07-24 NOTE — Progress Notes (Deleted)
79 y.o. G0P0000 Single African American female here for annual exam.    PCP:     Patient's last menstrual period was 06/14/1998 (approximate).           Sexually active: {yes no:314532}  The current method of family planning is post menopausal status.    Exercising: {yes no:314532}  {types:19826} Smoker:  no  Health Maintenance: Pap: 07-15-17 Neg:Neg HR HPV, 07-14-16 Neg:Neg HR HPV, 02-24-15 Neg:Pos HR HPV, Pos 18/45 History of abnormal Pap:  Yes, 02-24-15 Neg:Pos HR HPV and Pos 18/45; colposcopy 04-25-15 unsatisfactory. LEEP 05-27-15 revealed LGSIL of cervix, margins negative and ECC negative MMG: 12-12-18 3D/Neg/density A/BiRads1 Colonoscopy:***11-26-13 polyp;next due 11/2018  BMD:   ***02-10-10  Result :Normal TDaP:  06-12-18 Gardasil:   no HIV: not indicated Hep C: not indicated Screening Labs:  Hb today: ***, Urine today: ***   reports that she quit smoking about 19 years ago. She has never used smokeless tobacco. She reports that she does not drink alcohol or use drugs.  Past Medical History:  Diagnosis Date  . Arthritis   . CKD (chronic kidney disease) stage 3, GFR 30-59 ml/min    Dr. [name]  . Diabetic retinopathy   . DM retinopathy ([place]) 08/17/12   early  . Dyspnea   . Essential hypertension, benign   . Glaucoma   . Gout   . Pure hypercholesterolemia   . Type II or unspecified type diabetes mellitus without mention of complication, not stated as uncontrolled     Past Surgical History:  Procedure Laterality Date  . BRAIN SURGERY  1969   Burr holes in skull to alleviate pressure on the brain (no shunt)  . CATARACT EXTRACTION, BILATERAL  2012   Dr. [name]  . COLONOSCOPY  3/05, 11/2013   due again 2020  . COLPOSCOPY N/A 05/27/2015   Procedure: COLPOSCOPY;  Surgeon: [name], MD;  Location: [facility];  Service: Gynecology;  Laterality: N/A;  . LEEP N/A 05/27/2015   Procedure: LOOP ELECTROSURGICAL EXCISION PROCEDURE (LEEP);  Surgeon: [name], MD;  Location: [facility];  Service: Gynecology;  Laterality: N/A;  . TONSILLECTOMY  age 19    Current Outpatient Medications  Medication Sig Dispense Refill  . ACCU-CHEK AVIVA PLUS test strip TEST BLOOD SUGAR TWICE DAILY 200 strip 3  . Accu-Chek Softclix Lancets lancets TEST BLOOD SUGAR TWICE DAILY 200 each 2  . allopurinol (ZYLOPRIM) 100 MG tablet Take 1 tablet (100 mg total) by mouth daily. 90 tablet 1  . amLODipine (NORVASC) 10 MG tablet Take 1 tablet (10 mg total) by mouth daily. 90 tablet 1  . aspirin 81 MG tablet Take 81 mg by mouth daily.      Marland Kitchen atorvastatin (LIPITOR) 80 MG tablet TAKE 1 TABLET EVERY DAY 90 tablet 0  . Blood Glucose Monitoring Suppl (ACCU-CHEK NANO SMARTVIEW) w/Device KIT 1 each by Does not apply route 2 (two) times daily. BID 1 kit 0  . brimonidine (ALPHAGAN) 0.2 % ophthalmic solution 1 drop 2 (two) times daily.    . dorzolamide-timolol (COSOPT) 22.3-6.8 MG/ML ophthalmic solution 1 drop 2 (two) times daily.    . DROPLET INSULIN SYRINGE 31G X 5/16" 0.5 ML MISC INJECT EVERY DAY 100 each 3  . enalapril (VASOTEC) 10 MG tablet TAKE 1 TABLET EVERY DAY 90 tablet 0  . ezetimibe (ZETIA) 10 MG tablet Take 1 tablet (10 mg total) by mouth daily. 90 tablet 0  . furosemide (LASIX) 20 MG tablet Take 20  mg by mouth daily.     . insulin glargine (LANTUS) 100 UNIT/ML injection INJECT 20 UNITS EVERY NIGHT AT BEDTIME (DISCARD OPEN VIAL 28 DAYS AFTER FIRST OPEN) 30 mL 2  . latanoprost (XALATAN) 0.005 % ophthalmic solution Place 1 drop into both eyes at bedtime.      . pioglitazone (ACTOS) 30 MG tablet TAKE 1 TABLET EVERY DAY 90 tablet 0  . VITAMIN D PO Take 2,000 Int'l Units by mouth daily.     No current facility-administered medications for this visit.    Family History  Problem Relation Age of Onset  . Hypertension Mother   . Cancer Mother        lung (nonsmoker)  . Stroke Father   . Hypertension Father   . Diabetes Father   . Hypertension Sister   . Diabetes Sister   .  Heart disease Sister   . Hypertension Sister   . Breast cancer Maternal Aunt 24       21 of old age  . Diabetes Paternal Grandmother     Review of Systems  Exam:   LMP 06/14/1998 (Approximate)     General appearance: alert, cooperative and appears stated age Head: normocephalic, without obvious abnormality, atraumatic Neck: no adenopathy, supple, symmetrical, trachea midline and thyroid normal to inspection and palpation Lungs: clear to auscultation bilaterally Breasts: normal appearance, no masses or tenderness, No nipple retraction or dimpling, No nipple discharge or bleeding, No axillary adenopathy Heart: regular rate and rhythm Abdomen: soft, non-tender; no masses, no organomegaly Extremities: extremities normal, atraumatic, no cyanosis or edema Skin: skin color, texture, turgor normal. No rashes or lesions Lymph nodes: cervical, supraclavicular, and axillary nodes normal. Neurologic: grossly normal  Pelvic: External genitalia:  no lesions              No abnormal inguinal nodes palpated.              Urethra:  normal appearing urethra with no masses, tenderness or lesions              Bartholins and Skenes: normal                 Vagina: normal appearing vagina with normal color and discharge, no lesions              Cervix: no lesions              Pap taken: {yes no:314532} Bimanual Exam:  Uterus:  normal size, contour, position, consistency, mobility, non-tender              Adnexa: no mass, fullness, tenderness              Rectal exam: {yes no:314532}.  Confirms.              Anus:  normal sphincter tone, no lesions  Chaperone was present for exam.  Assessment:   Well woman visit with normal exam.   Plan: Mammogram screening discussed. Self breast awareness reviewed. Pap and HR HPV as above. Guidelines for Calcium, Vitamin D, regular exercise program including cardiovascular and weight bearing exercise.   Follow up annually and prn.   Additional counseling  given.  {yes [id number]. _______ minutes face to face time of which over 50% was spent in counseling.    After visit summary provided.

## 2019-07-24 NOTE — Progress Notes (Signed)
Subjective: Kerri Richardson is a 79 y.o. female patient with history of diabetes who presents to the office today complaining of pain at the left first toe.  Patient reports that she stubbed her toe a few weeks ago and bumped it on the curve states that it used to be more painful slowly is getting better on average pain is 7 out of 10 with direct pressure or touch to the first toe has treated the area with icing and Tylenol which seems to help patient reports that she had an injury to the toe on January 19 when she bumped it on the curb trying to go get her Covid vaccination.  Patient also wants her nails trimmed today and reports that her last blood sugar was 97 and last A1c was 6.2.  No other issues noted.   Patient Active Problem List   Diagnosis Date Noted  . Vitamin D deficiency 06/21/2018  . Class 3 severe obesity with serious comorbidity and body mass index (BMI) of 40.0 to 44.9 in adult ([place]) 06/21/2018  . Irregular heartbeat 08/10/2016  . Acute gout 09/24/2015  . Morbid obesity with body mass index of 45.0-49.9 in adult [facility]) 06/10/2014  . Type 2 diabetes mellitus without complication, with long-term current use of insulin ([place]) 03/25/2014  . Hypertensive nephropathy 03/25/2014  . Type 2 diabetes mellitus with diabetic nephropathy, with long-term current use of insulin ([place]) 12/10/2013  . Peripheral edema 12/22/2012  . Diabetic retinopathy ([place]) 08/23/2012  . Mixed hyperlipidemia 05/03/2011  . Type II or unspecified type diabetes mellitus without mention of complication, not stated as uncontrolled 02/01/2011  . Pure hypercholesterolemia 02/01/2011  . Essential hypertension 02/01/2011  . CKD (chronic kidney disease), stage III 02/01/2011   Current Outpatient Medications on File Prior to Visit  Medication Sig Dispense Refill  . ACCU-CHEK AVIVA PLUS test strip TEST BLOOD SUGAR TWICE DAILY 200 strip 3  . Accu-Chek Softclix Lancets lancets TEST BLOOD SUGAR TWICE DAILY 200 each 2  .  allopurinol (ZYLOPRIM) 100 MG tablet Take 1 tablet (100 mg total) by mouth daily. 90 tablet 1  . amLODipine (NORVASC) 10 MG tablet Take 1 tablet (10 mg total) by mouth daily. 90 tablet 1  . aspirin 81 MG tablet Take 81 mg by mouth daily.      Marland Kitchen atorvastatin (LIPITOR) 80 MG tablet TAKE 1 TABLET EVERY DAY 90 tablet 0  . Blood Glucose Monitoring Suppl (ACCU-CHEK NANO SMARTVIEW) w/Device KIT 1 each by Does not apply route 2 (two) times daily. BID 1 kit 0  . brimonidine (ALPHAGAN) 0.2 % ophthalmic solution 1 drop 2 (two) times daily.    . dorzolamide-timolol (COSOPT) 22.3-6.8 MG/ML ophthalmic solution 1 drop 2 (two) times daily.    . DROPLET INSULIN SYRINGE 31G X 5/16" 0.5 ML MISC INJECT EVERY DAY 100 each 3  . enalapril (VASOTEC) 10 MG tablet TAKE 1 TABLET EVERY DAY 90 tablet 0  . ezetimibe (ZETIA) 10 MG tablet Take 1 tablet (10 mg total) by mouth daily. 90 tablet 0  . furosemide (LASIX) 20 MG tablet Take 20 mg by mouth daily.     . insulin glargine (LANTUS) 100 UNIT/ML injection INJECT 20 UNITS EVERY NIGHT AT BEDTIME (DISCARD OPEN VIAL 28 DAYS AFTER FIRST OPEN) 30 mL 2  . latanoprost (XALATAN) 0.005 % ophthalmic solution Place 1 drop into both eyes at bedtime.      . pioglitazone (ACTOS) 30 MG tablet TAKE 1 TABLET EVERY DAY 90 tablet 0  . VITAMIN D PO  Take 2,000 Int'l Units by mouth daily.     No current facility-administered medications on file prior to visit.   No Known Allergies  Recent Results (from the past 2160 hour(s))  HM DIABETES EYE EXAM     Status: None   Collection Time: 06/06/19 12:00 AM  Result Value Ref Range   HM Diabetic Eye Exam No Retinopathy No Retinopathy    Comment: Dr. Clent Jacks  HgB A1c     Status: Abnormal   Collection Time: 06/13/19  9:56 AM  Result Value Ref Range   Hemoglobin A1C 6.2 (A) 4.0 - 5.6 %   HbA1c POC (<> result, manual entry)     HbA1c, POC (prediabetic range)     HbA1c, POC (controlled diabetic range)    Uric acid     Status: None   Collection  Time: 06/13/19 10:44 AM  Result Value Ref Range   Uric Acid 7.1 3.1 - 7.9 mg/dL    Comment:            Therapeutic target for gout patients: <6.0  Urine Microalbumin w/creat. ratio     Status: None   Collection Time: 06/13/19 10:44 AM  Result Value Ref Range   Creatinine, Urine 163.0 Not Estab. mg/dL   Microalbumin, Urine 11.3 Not Estab. ug/mL   Microalb/Creat Ratio 7 0 - 29 mg/g creat    Comment:                        Normal:                0 -  29                        Moderately increased: 30 - 300                        Severely increased:       >300   TSH     Status: None   Collection Time: 06/13/19 10:44 AM  Result Value Ref Range   TSH 4.110 0.450 - 4.500 uIU/mL  Vitamin D 25 hydroxy     Status: Abnormal   Collection Time: 06/13/19 10:44 AM  Result Value Ref Range   Vit D, 25-Hydroxy 27.9 (L) 30.0 - 100.0 ng/mL    Comment: Vitamin D deficiency has been defined by the [place] and an Endocrine Society practice guideline as a level of serum 25-OH vitamin D less than 20 ng/mL (1,2). The Endocrine Society went on to further define vitamin D insufficiency as a level between 21 and 29 ng/mL (2). 1. IOM (Institute of Medicine). 2010. Dietary reference    intakes for calcium and D. [place]: The    [employer]. 2. Holick MF, [place], Bischoff-Ferrari HA, et al.    Evaluation, treatment, and prevention of vitamin D    deficiency: an Endocrine Society clinical practice    guideline. JCEM. 2011 Jul; 96(7):1911-30.   Comprehensive metabolic panel     Status: Abnormal   Collection Time: 06/13/19 10:44 AM  Result Value Ref Range   Glucose 126 (H) 65 - 99 mg/dL   BUN 48 (H) 8 - 27 mg/dL   Creatinine, Ser 1.72 (H) 0.57 - 1.00 mg/dL   GFR calc non Af Amer 28 (L) >59 mL/min/1.73   GFR calc Af Amer 33 (L) >59 mL/min/1.73   BUN/Creatinine Ratio 28 12 - 28  Sodium 137 134 - 144 mmol/L   Potassium 4.5 3.5 - 5.2 mmol/L   Chloride 98 96 - 106 mmol/L    CO2 21 20 - 29 mmol/L   Calcium 9.4 8.7 - 10.3 mg/dL   Total Protein 7.3 6.0 - 8.5 g/dL   Albumin 4.5 3.7 - 4.7 g/dL   Globulin, Total 2.8 1.5 - 4.5 g/dL   Albumin/Globulin Ratio 1.6 1.2 - 2.2   Bilirubin Total 0.3 0.0 - 1.2 mg/dL   Alkaline Phosphatase 90 39 - 117 IU/L   AST 17 0 - 40 IU/L   ALT 12 0 - 32 IU/L  Lipid panel     Status: Abnormal   Collection Time: 06/13/19 10:44 AM  Result Value Ref Range   Cholesterol, Total 197 100 - 199 mg/dL   Triglycerides 137 0 - 149 mg/dL   HDL 47 >39 mg/dL   VLDL Cholesterol Cal 24 5 - 40 mg/dL   LDL Chol Calc (NIH) 126 (H) 0 - 99 mg/dL   Chol/HDL Ratio 4.2 0.0 - 4.4 ratio    Comment:                                   T. Chol/HDL Ratio                                             Men  Women                               1/2 Avg.Risk  3.4    3.3                                   Avg.Risk  5.0    4.4                                2X Avg.Risk  9.6    7.1                                3X Avg.Risk 23.4   11.0     Objective: General: Patient is awake, alert, and oriented x 3 and in no acute distress.  Integument: Skin is warm, dry and supple bilateral. Nails are tender, long, thickened and dystrophic with subungual debris, consistent with onychomycosis, 1-5 bilateral with the most thickness on right 1st toenail and pince deformity like previous. No signs of infection. No open lesions or preulcerative lesions present bilateral. Remaining integument unremarkable.  Vasculature:  Dorsalis Pedis pulse 1/4 bilateral. Posterior Tibial pulse  0/4 bilateral due to 1+ pitting edema to ankles Capillary fill time <3 sec 1-5 bilateral. Scant hair growth to the level of the digits. Temperature gradient within normal limits. Mild brawny skin changes and varicosities present bilateral.  Neurology: The patient has intact sensation measured with a 5.07/10g Semmes Weinstein Monofilament at all pedal sites bilateral. Vibratory sensation diminished bilateral  with tuning fork. No Babinski sign present bilateral.   Musculoskeletal: Mild pain at left hallux IPJ, Aspymptomatic pes planus pedal deformities noted bilateral. Muscular strength 5/5 in all lower  extremity muscular groups bilateral without pain on range of motion except at hallux IPJ on left. No tenderness with calf compression bilateral.  Xrays- Decreased osseous mineralization no fracture at 1st toe, there mild arthritis at MTPJ on left, no other acute findings.   Assessment and Plan: Problem List Items Addressed This Visit    None    Visit Diagnoses    Left foot pain    -  Primary   Relevant Orders   DG Foot Complete Left   Contusion of left great toe without damage to nail, initial encounter       Pain due to onychomycosis of toenails of both feet       Diabetes mellitus with stage 3 chronic kidney disease (HCC)       PVD (peripheral vascular disease) ([place])          -Examined patient. -Xrays reviewed -Discussed contusion of toe without fracture -Dispensed toe cap to use when in shoe to prevent rubbing or pain to toe -Recommend rest, ice, elevation and tylenol as needed for pain at 1st toe on left -Re-discussed and educated patient on diabetic foot care, especially with  regards to the vascular, neurological and musculoskeletal systems.  -Mechanically debrided all nails 1-5 bilateral using sterile nail nipper and filed with dremel without incident  -Answered all patient questions -Patient to return as needed or sooner if problems or issues arise.   Landis Martins, DPM

## 2019-07-25 ENCOUNTER — Telehealth: Payer: Self-pay | Admitting: Obstetrics and Gynecology

## 2019-07-25 ENCOUNTER — Other Ambulatory Visit: Payer: Self-pay | Admitting: Sports Medicine

## 2019-07-25 ENCOUNTER — Ambulatory Visit: Payer: Medicare PPO | Admitting: Obstetrics and Gynecology

## 2019-07-25 DIAGNOSIS — S90112A Contusion of left great toe without damage to nail, initial encounter: Secondary | ICD-10-CM

## 2019-07-25 NOTE — Telephone Encounter (Signed)
Patient arrived for appointment; unprepared financially. Cancelled and will contact PCP for exam.

## 2019-08-14 ENCOUNTER — Other Ambulatory Visit: Payer: Self-pay | Admitting: Family Medicine

## 2019-08-14 DIAGNOSIS — E785 Hyperlipidemia, unspecified: Secondary | ICD-10-CM

## 2019-08-14 DIAGNOSIS — E1169 Type 2 diabetes mellitus with other specified complication: Secondary | ICD-10-CM

## 2019-09-04 DIAGNOSIS — H401132 Primary open-angle glaucoma, bilateral, moderate stage: Secondary | ICD-10-CM | POA: Diagnosis not present

## 2019-09-13 ENCOUNTER — Other Ambulatory Visit: Payer: Medicare PPO

## 2019-09-13 ENCOUNTER — Other Ambulatory Visit: Payer: Self-pay

## 2019-09-13 DIAGNOSIS — Z5181 Encounter for therapeutic drug level monitoring: Secondary | ICD-10-CM | POA: Diagnosis not present

## 2019-09-13 DIAGNOSIS — E783 Hyperchylomicronemia: Secondary | ICD-10-CM

## 2019-09-14 LAB — HEPATIC FUNCTION PANEL
ALT: 11 IU/L (ref 0–32)
AST: 16 IU/L (ref 0–40)
Albumin: 4.4 g/dL (ref 3.7–4.7)
Alkaline Phosphatase: 84 IU/L (ref 39–117)
Bilirubin Total: 0.3 mg/dL (ref 0.0–1.2)
Bilirubin, Direct: 0.1 mg/dL (ref 0.00–0.40)
Total Protein: 6.8 g/dL (ref 6.0–8.5)

## 2019-09-14 LAB — LIPID PANEL
Chol/HDL Ratio: 2.7 ratio (ref 0.0–4.4)
Cholesterol, Total: 126 mg/dL (ref 100–199)
HDL: 47 mg/dL (ref >39)
LDL Chol Calc (NIH): 62 mg/dL (ref 0–99)
Triglycerides: 86 mg/dL (ref 0–149)
VLDL Cholesterol Cal: 17 mg/dL (ref 5–40)

## 2019-11-20 ENCOUNTER — Other Ambulatory Visit: Payer: Self-pay | Admitting: Family Medicine

## 2019-11-20 DIAGNOSIS — Z1231 Encounter for screening mammogram for malignant neoplasm of breast: Secondary | ICD-10-CM

## 2019-11-26 ENCOUNTER — Other Ambulatory Visit: Payer: Self-pay | Admitting: Family Medicine

## 2019-11-26 DIAGNOSIS — M109 Gout, unspecified: Secondary | ICD-10-CM

## 2019-12-11 NOTE — Progress Notes (Signed)
Chief Complaint  Patient presents with  . Diabetes    fasting med check. Feels like she can't hear very well and she is talking louder. Has not called Dr. Collene Mares [id number].    Patient presents for 6 month med check, to follow up on chronic problems. She reports decreased hearing in the left ear.  Obesity: [facility], [facility] in [facility].  She had lost 49# with them. At her physical, she reported going back to some of her old ways (not using the same bread or dressings that she used while in [facility] program), and regained some weight. Since December she hasn't been very motivated.  She doesn't get exercise. "I don't eat that much", doesn't go anywhere. She drinks water and Diet Sprite only. She reports she can't afford to go back to [facility] clinic. Wt Readings from Last 3 Encounters:  06/13/19 258 lb 12.8 oz (117.4 kg)  11/20/18 240 lb (108.9 kg)  08/22/18 237 lb (107.5 kg)   Diabetes follow-up:Diabetes has been well controlled. She needed less insulin when weight was down (12 U), in December she was using 15 U daily. She had to further titrate up, due to weight gain and higher sugars.  Currently taking 22 U nightly. Sugars have been running 90-112 in the mornings (not checked other times). She reports that she ran out of pioglitazone last week (hasn't been filled since 04/2018, but states [employer] sent her too much medication; only ran out last week). Denies seeing any higher sugars since she ran out of the medication. She denies hypoglycemia, polydipsia or polyuria.  She has urinary frequency at night (every 2 hours), not as often during the day. Last eye examwas 05/2019.Patient follows a low sugar diet and checks feet regularly without concerns. Lab Results  Component Value Date   HGBA1C 6.2 (A) 06/13/2019   Hyperlipidemia follow-up: Patient is reportedly following a low-fat, low cholesterol diet. Her LDL was above goal in December, on  46m of atorvastatin, so zetia was added.  Repeat lipids were at goal.  She is compliant with medications(atorvastatin and zetia)and denies medication side effects. Lab Results  Component Value Date   CHOL 126 09/13/2019   HDL 47 09/13/2019   LDLCALC 62 09/13/2019   TRIG 86 09/13/2019   CHOLHDL 2.7 09/13/2019   Hypertension follow-up: She hasn't been checking her BP at home (reports that monitor hurts her arm). Denies dizziness, headaches, chest pain. Denies side effects of medications. She reports compliance with amlodipine and enalapril (both 143mdaily). Hasn't been walking due to knee pain.  Vitamin D deficiency--Her last level was low when taking 2000 IU daily (27.9 in 05/2019).  She is currently taking 5000 IU daily.  H/o toe pain--goutand degenerative changes. On allopurinol for prevention, with colchicine for prn use(hasn't needed in a long time). Occasional her "toe feels funny", unsure if gout. Only on 10075m allopurinol(dose not increased due to impaired Cr and lack of gout symptoms on this dose).Denies any pain or swelling.  Her uric acid level had increased from 6.3 to 7.1 on last check (05/2019).  She reported compliance with her allopurinol. She was sent list of low purine diet. She reports she never got this list.  Lab Results  Component Value Date   LABURIC 7.1 06/13/2019   [facility]--She saw Dr. LorHarrie Jeans July 2020 (Dr. PowFlorene Glentired).  Last Cr with them was 1.39.  Lasix was decreased to 1/2 tablet (46m66mhree times/week (prev took daily), but  due to recurrent swelling on days she didn't take the diuretic, she increased back to daily dosing.  Her BUN/Cr had bumped back up to (48/1.72 on check in 05/2019).  She was asked to increase water intake, which she reports she has done.  She sees Dr. Royce Macadamia again next month. She continues to take lasix 1/2 tablet daily.  H/o colon polyps, noted last 11/2013. She is due for 5 year f/u with Dr. Collene Mares. She never got notified by  their office. She was asked to call and check at her physical.   PMH, [place], [place] reviewed  Outpatient Encounter Medications as of 12/12/2019  Medication Sig Note  . ACCU-CHEK AVIVA PLUS test strip TEST BLOOD SUGAR TWICE DAILY   . Accu-Chek Softclix Lancets lancets TEST BLOOD SUGAR TWICE DAILY   . allopurinol (ZYLOPRIM) 100 MG tablet Take 1 tablet (100 mg total) by mouth daily.   Marland Kitchen amLODipine (NORVASC) 10 MG tablet Take 1 tablet (10 mg total) by mouth daily.   Marland Kitchen aspirin 81 MG tablet Take 81 mg by mouth daily.     Marland Kitchen atorvastatin (LIPITOR) 80 MG tablet TAKE 1 TABLET EVERY DAY   . Blood Glucose Monitoring Suppl (ACCU-CHEK NANO SMARTVIEW) w/Device KIT 1 each by Does not apply route 2 (two) times daily. BID   . brimonidine (ALPHAGAN) 0.2 % ophthalmic solution 1 drop 2 (two) times daily.   . dorzolamide-timolol (COSOPT) 22.3-6.8 MG/ML ophthalmic solution 1 drop 2 (two) times daily.   . DROPLET INSULIN SYRINGE 31G X 5/16" 0.5 ML MISC INJECT EVERY DAY   . enalapril (VASOTEC) 10 MG tablet TAKE 1 TABLET EVERY DAY   . ezetimibe (ZETIA) 10 MG tablet TAKE 1 TABLET EVERY DAY   . furosemide (LASIX) 20 MG tablet Take 20 mg by mouth daily.  06/13/2019: Takes 1/2 tablet every day  . insulin glargine (LANTUS) 100 UNIT/ML injection INJECT 20 UNITS EVERY NIGHT AT BEDTIME (DISCARD OPEN VIAL 28 DAYS AFTER FIRST OPEN) 12/12/2019: 22 units nightly  . latanoprost (XALATAN) 0.005 % ophthalmic solution Place 1 drop into both eyes at bedtime.     . pioglitazone (ACTOS) 30 MG tablet TAKE 1 TABLET EVERY DAY   . VITAMIN D PO Take 2,000 Int'l Units by mouth daily. 12/12/2019: Taking 5000 IU daily   No facility-administered encounter medications on file as of 12/12/2019.   No Known Allergies  ROS:  No fever, chills, URI symptoms, headaches, dizziness, chest pain, shortness of breath, urinary complaints, GI complaints. Denies edema (only infrequently), taking lasix daily. R knee pain, limiting her walking. Occasional slight  ache in the toe, no swelling. Weight gain--regained weight, 24# in the last year Decreased hearing in the L ear   PHYSICAL EXAM:  BP 136/70   Pulse 92   Ht 5' 1.5" (1.562 m)   Wt 274 lb 12.8 oz (124.6 kg)   LMP 06/14/1998 (Approximate)   BMI 51.08 kg/m   Wt Readings from Last 3 Encounters:  12/12/19 274 lb 12.8 oz (124.6 kg)  06/13/19 258 lb 12.8 oz (117.4 kg)  11/20/18 240 lb (108.9 kg)    Well developed, pleasant, morbidly obesefemale in no distress HEENT: conjunctiva and sclera are clear, EOMI, wearing mask. Left TM is obscured by hard cerumen.  R TM and EAC is normal. After ear lavage, only minimal residual cerumen, TM easily visible and normal. She tolerated the procedure well. Neck: no lymphadenopathy or mass, no bruit Heart: regular rate and rhythm Lungs: clear bilaterally Back: no spinal or CVA tenderness  Abdomen: obese, soft, nontender, no mass Extremities:no edema, normal pulses Psych: normal mood, affect, hygiene and grooming Neuro: alert and oriented, normal gait. Skin: normal turgor, no rash or lesions   Lab Results  Component Value Date   HGBA1C 6.3 (A) 12/12/2019    ASSESSMENT/PLAN:  Type 2 diabetes mellitus with stage 3 chronic kidney disease, with long-term current use of insulin, unspecified whether stage 3a or 3b CKD ([place]) - Plan: HgB A1c, pioglitazone (ACTOS) 30 MG tablet, Comprehensive metabolic panel  Mixed hyperlipidemia - at goal on current regimen - Plan: ezetimibe (ZETIA) 10 MG tablet  Vitamin D deficiency - currently on 5000 IU daily - Plan: VITAMIN D 25 Hydroxy (Vit-D Deficiency, Fractures)  Gout involving toe of right foot, unspecified cause, unspecified chronicity - recheck uric acid; lasix may be increasing. She never got list of low purine foods--given today - Plan: Uric acid  Hyperuricemia - Plan: Uric acid  Essential hypertension - Plan: Comprehensive metabolic panel  Medication monitoring encounter  Class 3 severe obesity  due to excess calories with serious comorbidity and body mass index (BMI) of 50.0 to 59.9 in adult (HCC) - comorbidities--HTN, lipids, OA, DM. Counseled re: risks of obesity. Encouraged to resume diet she followed with [facility]. non-wt-bearing exercise  Hearing loss of left ear due to cerumen impaction - resolved s/p lavage   SEND LAB RESULTS TO DR Caryn Section AT [facility]  Pt reports allopurinol and amlodipine needed--on last bottle. Should have enough until early August. Gets amlodipine from nephro.  Await uric acid levels  Should need refills of lipitor and zetia (Given #90 in early March) She reports she has plenty of atorvastatin, that [facility] sent a lot. Needs zetia, pioglitazone refilled today. She has been out of pioglitazone x 2 weeks   F/u 6 months for CPE/AWV (will do pelvic also--insurance didn't cover WWE with Dr. Quincy Simmonds and CPE here). Sooner prn.  FTF 35-40 mins, plus additional time in chart review and documentation.   Restart [facility] (check around if your center isn't yet open). Try and resume diet that you had been following when at the weight loss clinic--cutting back on carbs, eating more protein.  Go back to the bread and dressings you were using while in the program.  Please try and do seated exercises daily (on the days not going to [employer], if you resume that program), as shown today.  This will get your heart rate up, improve circulation, blood pressure, sugars, without putting extra strain on your knee. Your knee will feel better by keeping it moving, and losing weight.  Consider restarting water aerobics, which is also easy on the knee.  Please contact Dr. Lorie Apley office to see if/when you are due for next colonoscopy.  Per my records, it was recommended to have a 5 year follow-up, which would have been 11/2018.

## 2019-12-12 ENCOUNTER — Encounter: Payer: Self-pay | Admitting: Family Medicine

## 2019-12-12 ENCOUNTER — Other Ambulatory Visit: Payer: Self-pay

## 2019-12-12 ENCOUNTER — Ambulatory Visit: Payer: Medicare PPO | Admitting: Family Medicine

## 2019-12-12 VITALS — BP 136/70 | HR 92 | Ht 61.5 in | Wt 274.8 lb

## 2019-12-12 DIAGNOSIS — M109 Gout, unspecified: Secondary | ICD-10-CM | POA: Diagnosis not present

## 2019-12-12 DIAGNOSIS — E782 Mixed hyperlipidemia: Secondary | ICD-10-CM | POA: Diagnosis not present

## 2019-12-12 DIAGNOSIS — Z794 Long term (current) use of insulin: Secondary | ICD-10-CM

## 2019-12-12 DIAGNOSIS — H6122 Impacted cerumen, left ear: Secondary | ICD-10-CM | POA: Diagnosis not present

## 2019-12-12 DIAGNOSIS — I10 Essential (primary) hypertension: Secondary | ICD-10-CM

## 2019-12-12 DIAGNOSIS — N183 Chronic kidney disease, stage 3 unspecified: Secondary | ICD-10-CM

## 2019-12-12 DIAGNOSIS — Z6841 Body Mass Index (BMI) 40.0 and over, adult: Secondary | ICD-10-CM

## 2019-12-12 DIAGNOSIS — E559 Vitamin D deficiency, unspecified: Secondary | ICD-10-CM

## 2019-12-12 DIAGNOSIS — E1122 Type 2 diabetes mellitus with diabetic chronic kidney disease: Secondary | ICD-10-CM | POA: Diagnosis not present

## 2019-12-12 DIAGNOSIS — E790 Hyperuricemia without signs of inflammatory arthritis and tophaceous disease: Secondary | ICD-10-CM | POA: Diagnosis not present

## 2019-12-12 DIAGNOSIS — Z5181 Encounter for therapeutic drug level monitoring: Secondary | ICD-10-CM

## 2019-12-12 LAB — POCT GLYCOSYLATED HEMOGLOBIN (HGB A1C): Hemoglobin A1C: 6.3 % — AB (ref 4.0–5.6)

## 2019-12-12 MED ORDER — PIOGLITAZONE HCL 30 MG PO TABS: 30.0000 mg | ORAL_TABLET | Freq: Every day | ORAL | 1 refills | Status: DC

## 2019-12-12 MED ORDER — EZETIMIBE 10 MG PO TABS: 10.0000 mg | ORAL_TABLET | Freq: Every day | ORAL | 1 refills | Status: DC

## 2019-12-12 NOTE — Patient Instructions (Signed)
Restart [facility] (check around if your center isn't yet open). Try and resume diet that you had been following when at the weight loss clinic--cutting back on carbs, eating more protein.  Go back to the bread and dressings you were using while in the program.  Please try and do seated exercises daily (on the days not going to [employer], if you resume that program), as shown today.  This will get your heart rate up, improve circulation, blood pressure, sugars, without putting extra strain on your knee. Your knee will feel better by keeping it moving, and losing weight.  Consider restarting water aerobics, which is also easy on the knee.  Please contact Dr. Kenna Gilbert office to see if/when you are due for next colonoscopy.  Per my records, it was recommended to have a 5 year follow-up, which would have been 11/2018.    Low-Purine Eating Plan A low-purine eating plan involves making food choices to limit your intake of purine. Purine is a kind of uric acid. Too much uric acid in your blood can cause certain conditions, such as gout and kidney stones. Eating a low-purine diet can help control these conditions. What are tips for following this plan? Reading food labels   Avoid foods with saturated or Trans fat.  Check the ingredient list of grains-based foods, such as bread and cereal, to make sure that they contain whole grains.  Check the ingredient list of sauces or soups to make sure they do not contain meat or fish.  When choosing soft drinks, check the ingredient list to make sure they do not contain high-fructose corn syrup. Shopping  Buy plenty of fresh fruits and vegetables.  Avoid buying canned or fresh fish.  Buy dairy products labeled as low-fat or nonfat.  Avoid buying premade or processed foods. These foods are often high in fat, salt (sodium), and added sugar. Cooking  Use olive oil instead of butter when cooking. Oils like olive oil, canola oil, and  sunflower oil contain healthy fats. Meal planning  Learn which foods do or do not affect you. If you find out that a food tends to cause your gout symptoms to flare up, avoid eating that food. You can enjoy foods that do not cause problems. If you have any questions about a food item, talk with your dietitian or health care provider.  Limit foods high in fat, especially saturated fat. Fat makes it harder for your body to get rid of uric acid.  Choose foods that are lower in fat and are lean sources of protein. General guidelines  Limit alcohol intake to no more than 1 drink a day for nonpregnant women and 2 drinks a day for men. One drink equals 12 oz of beer, 5 oz of wine, or 1 oz of hard liquor. Alcohol can affect the way your body gets rid of uric acid.  Drink plenty of water to keep your urine clear or pale yellow. Fluids can help remove uric acid from your body.  If directed by your health care provider, take a vitamin C supplement.  Work with your health care provider and dietitian to develop a plan to achieve or maintain a healthy weight. Losing weight can help reduce uric acid in your blood. What foods are recommended? The items listed may not be a complete list. Talk with your dietitian about what dietary choices are best for you. Foods low in purines Foods low in purines do not need to be limited. These include:  All fruits.  All low-purine vegetables, pickles, and olives.  Breads, pasta, rice, cornbread, and popcorn. Cake and other baked goods.  All dairy foods.  Eggs, nuts, and nut butters.  Spices and condiments, such as salt, herbs, and vinegar.  Plant oils, butter, and margarine.  Water, sugar-free soft drinks, tea, coffee, and cocoa.  Vegetable-based soups, broths, sauces, and gravies. Foods moderate in purines Foods moderate in purines should be limited to the amounts listed.   cup of asparagus, cauliflower, spinach, mushrooms, or green peas, each  day.  2/3 cup uncooked oatmeal, each day.   cup dry wheat bran or wheat germ, each day.  2-3 ounces of meat or poultry, each day.  4-6 ounces of shellfish, such as crab, lobster, oysters, or shrimp, each day.  1 cup cooked beans, peas, or lentils, each day.  Soup, broths, or bouillon made from meat or fish. Limit these foods as much as possible. What foods are not recommended? The items listed may not be a complete list. Talk with your dietitian about what dietary choices are best for you. Limit your intake of foods high in purines, including:  Beer and other alcohol.  Meat-based gravy or sauce.  Canned or fresh fish, such as: ? Anchovies, sardines, herring, and tuna. ? Mussels and scallops. ? Codfish, trout, and haddock.  Tomasa Blase.  Organ meats, such as: ? Liver or kidney. ? Tripe. ? Sweetbreads (thymus gland or pancreas).  Wild Education officer, environmental.  Yeast or yeast extract supplements.  Drinks sweetened with high-fructose corn syrup. Summary  Eating a low-purine diet can help control conditions caused by too much uric acid in the body, such as gout or kidney stones.  Choose low-purine foods, limit alcohol, and limit foods high in fat.  You will learn over time which foods do or do not affect you. If you find out that a food tends to cause your gout symptoms to flare up, avoid eating that food. This information is not intended to replace advice given to you by your health care provider. Make sure you discuss any questions you have with your health care provider. Document Revised: 05/13/2017 Document Reviewed: 07/14/2016 Elsevier Patient Education  2020 [employer].

## 2019-12-13 LAB — COMPREHENSIVE METABOLIC PANEL
ALT: 20 IU/L (ref 0–32)
AST: 18 IU/L (ref 0–40)
Albumin/Globulin Ratio: 1.7 (ref 1.2–2.2)
Albumin: 4.3 g/dL (ref 3.7–4.7)
Alkaline Phosphatase: 104 IU/L (ref 48–121)
BUN/Creatinine Ratio: 24 (ref 12–28)
BUN: 34 mg/dL — ABNORMAL HIGH (ref 8–27)
Bilirubin Total: 0.3 mg/dL (ref 0.0–1.2)
CO2: 19 mmol/L — ABNORMAL LOW (ref 20–29)
Calcium: 9.4 mg/dL (ref 8.7–10.3)
Chloride: 106 mmol/L (ref 96–106)
Creatinine, Ser: 1.4 mg/dL — ABNORMAL HIGH (ref 0.57–1.00)
GFR calc Af Amer: 42 mL/min/{1.73_m2} — ABNORMAL LOW (ref >59)
GFR calc non Af Amer: 37 mL/min/{1.73_m2} — ABNORMAL LOW (ref >59)
Globulin, Total: 2.6 g/dL (ref 1.5–4.5)
Glucose: 171 mg/dL — ABNORMAL HIGH (ref 65–99)
Potassium: 4.9 mmol/L (ref 3.5–5.2)
Sodium: 141 mmol/L (ref 134–144)
Total Protein: 6.9 g/dL (ref 6.0–8.5)

## 2019-12-13 LAB — VITAMIN D 25 HYDROXY (VIT D DEFICIENCY, FRACTURES): Vit D, 25-Hydroxy: 49.8 ng/mL (ref 30.0–100.0)

## 2019-12-13 LAB — URIC ACID: Uric Acid: 6.7 mg/dL (ref 3.1–7.9)

## 2019-12-18 ENCOUNTER — Other Ambulatory Visit: Payer: Self-pay | Admitting: Family Medicine

## 2019-12-18 DIAGNOSIS — E785 Hyperlipidemia, unspecified: Secondary | ICD-10-CM

## 2019-12-19 ENCOUNTER — Ambulatory Visit
Admission: RE | Admit: 2019-12-19 | Discharge: 2019-12-19 | Disposition: A | Discharge status: Home / Self Care | Payer: Medicare PPO | Source: Ambulatory Visit | Attending: Family Medicine | Admitting: Family Medicine

## 2019-12-19 ENCOUNTER — Other Ambulatory Visit: Payer: Self-pay

## 2019-12-19 DIAGNOSIS — Z1231 Encounter for screening mammogram for malignant neoplasm of breast: Secondary | ICD-10-CM | POA: Diagnosis not present

## 2019-12-26 DIAGNOSIS — E559 Vitamin D deficiency, unspecified: Secondary | ICD-10-CM | POA: Diagnosis not present

## 2019-12-26 DIAGNOSIS — E1122 Type 2 diabetes mellitus with diabetic chronic kidney disease: Secondary | ICD-10-CM | POA: Diagnosis not present

## 2019-12-26 DIAGNOSIS — N1832 Chronic kidney disease, stage 3b: Secondary | ICD-10-CM | POA: Diagnosis not present

## 2019-12-26 DIAGNOSIS — I129 Hypertensive chronic kidney disease with stage 1 through stage 4 chronic kidney disease, or unspecified chronic kidney disease: Secondary | ICD-10-CM | POA: Diagnosis not present

## 2020-01-02 ENCOUNTER — Other Ambulatory Visit: Payer: Self-pay | Admitting: Family Medicine

## 2020-02-27 ENCOUNTER — Other Ambulatory Visit: Payer: Self-pay | Admitting: Family Medicine

## 2020-02-28 ENCOUNTER — Telehealth: Payer: Self-pay

## 2020-02-28 DIAGNOSIS — M109 Gout, unspecified: Secondary | ICD-10-CM

## 2020-02-28 MED ORDER — ALLOPURINOL 100 MG PO TABS: 100.0000 mg | ORAL_TABLET | Freq: Every day | ORAL | 1 refills | Status: DC

## 2020-02-28 NOTE — Telephone Encounter (Signed)
Received a fax from [facility] for a refill on the pts. Allopurinol pt. Last apt was 12/12/19 next apt was 07/07/20.

## 2020-03-03 DIAGNOSIS — E119 Type 2 diabetes mellitus without complications: Secondary | ICD-10-CM | POA: Diagnosis not present

## 2020-03-03 DIAGNOSIS — Z961 Presence of intraocular lens: Secondary | ICD-10-CM | POA: Diagnosis not present

## 2020-03-03 DIAGNOSIS — H401132 Primary open-angle glaucoma, bilateral, moderate stage: Secondary | ICD-10-CM | POA: Diagnosis not present

## 2020-03-03 DIAGNOSIS — H353121 Nonexudative age-related macular degeneration, left eye, early dry stage: Secondary | ICD-10-CM | POA: Diagnosis not present

## 2020-03-03 DIAGNOSIS — H04123 Dry eye syndrome of bilateral lacrimal glands: Secondary | ICD-10-CM | POA: Diagnosis not present

## 2020-03-03 DIAGNOSIS — H43813 Vitreous degeneration, bilateral: Secondary | ICD-10-CM | POA: Diagnosis not present

## 2020-03-03 LAB — HM DIABETES EYE EXAM

## 2020-03-05 ENCOUNTER — Encounter: Payer: Self-pay | Admitting: *Deleted

## 2020-03-07 ENCOUNTER — Encounter: Payer: Self-pay | Admitting: Family Medicine

## 2020-03-15 ENCOUNTER — Other Ambulatory Visit: Payer: Self-pay | Admitting: Family Medicine

## 2020-03-15 DIAGNOSIS — E785 Hyperlipidemia, unspecified: Secondary | ICD-10-CM

## 2020-04-16 ENCOUNTER — Other Ambulatory Visit: Payer: Self-pay | Admitting: Family Medicine

## 2020-04-16 DIAGNOSIS — E1122 Type 2 diabetes mellitus with diabetic chronic kidney disease: Secondary | ICD-10-CM

## 2020-04-16 DIAGNOSIS — E782 Mixed hyperlipidemia: Secondary | ICD-10-CM

## 2020-04-16 DIAGNOSIS — N183 Chronic kidney disease, stage 3 unspecified: Secondary | ICD-10-CM

## 2020-05-05 ENCOUNTER — Encounter: Payer: Self-pay | Admitting: Family Medicine

## 2020-05-10 ENCOUNTER — Other Ambulatory Visit: Payer: Self-pay | Admitting: Family Medicine

## 2020-05-10 DIAGNOSIS — N183 Chronic kidney disease, stage 3 unspecified: Secondary | ICD-10-CM

## 2020-05-12 ENCOUNTER — Other Ambulatory Visit: Payer: Self-pay | Admitting: Family Medicine

## 2020-05-12 DIAGNOSIS — E119 Type 2 diabetes mellitus without complications: Secondary | ICD-10-CM

## 2020-05-12 DIAGNOSIS — E1169 Type 2 diabetes mellitus with other specified complication: Secondary | ICD-10-CM

## 2020-06-30 ENCOUNTER — Other Ambulatory Visit: Payer: Self-pay | Admitting: Family Medicine

## 2020-06-30 DIAGNOSIS — Z794 Long term (current) use of insulin: Secondary | ICD-10-CM

## 2020-06-30 DIAGNOSIS — N183 Chronic kidney disease, stage 3 unspecified: Secondary | ICD-10-CM

## 2020-06-30 DIAGNOSIS — E782 Mixed hyperlipidemia: Secondary | ICD-10-CM

## 2020-07-01 NOTE — Telephone Encounter (Signed)
Pt states she can wait until upcoming appt next week

## 2020-07-06 NOTE — Patient Instructions (Addendum)
HEALTH MAINTENANCE RECOMMENDATIONS:  It is recommended that you get at least 30 minutes of aerobic exercise at least 5 days/week (for weight loss, you may need as much as 60-90 minutes). This can be any activity that gets your heart rate up. This can be divided in 10-15 minute intervals if needed, but try and build up your endurance at least once a week.  Weight bearing exercise is also recommended twice weekly.  Eat a healthy diet with lots of vegetables, fruits and fiber.  "Colorful" foods have a lot of vitamins (ie green vegetables, tomatoes, red peppers, etc).  Limit sweet tea, regular sodas and alcoholic beverages, all of which has a lot of calories and sugar.  Up to 1 alcoholic drink daily may be beneficial for women (unless trying to lose weight, watch sugars).  Drink a lot of water.  Calcium recommendations are 1200-1500 mg daily (1500 mg for postmenopausal women or women without ovaries), and vitamin D 1000 IU daily.  This should be obtained from diet and/or supplements (vitamins), and calcium should not be taken all at once, but in divided doses.  Monthly self breast exams and yearly mammograms for women over the age of 30 is recommended.  Sunscreen of at least SPF 30 should be used on all sun-exposed parts of the skin when outside between the hours of 10 am and 4 pm (not just when at beach or pool, but even with exercise, golf, tennis, and yard work!)  Use a sunscreen that says "broad spectrum" so it covers both UVA and UVB rays, and make sure to reapply every 1-2 hours.  Remember to change the batteries in your smoke detectors when changing your clock times in the spring and fall. Carbon monoxide detectors are recommended for your home.  Use your seat belt every time you are in a car, and please drive safely and not be distracted with cell phones and texting while driving.   Kerri Richardson , Thank you for taking time to come for your Medicare Wellness Visit. I appreciate your ongoing  commitment to your health goals. Please review the following plan we discussed and let me know if I can assist you in the future.    This is a list of the screening recommended for you and due dates:  Health Maintenance  Topic Date Due  .  Hepatitis C: One time screening is recommended by Center for Disease Control  (CDC) for  adults born from [birth year] through 1963.   Never done  . COVID-19 Vaccine (2 - Pfizer 3-dose series) 05/22/2020  . Complete foot exam   06/12/2020  . Hemoglobin A1C  06/12/2020  . Eye exam for diabetics  03/03/2021  . Tetanus Vaccine  06/12/2028  . Flu Shot  Completed  . DEXA scan (bone density measurement)  Completed  . Pneumonia vaccines  Completed   A1c, foot exam, and Hepatitis C screening were performed today. Pneumovax booster given today.  I recommend getting the new shingles vaccine (Shingrix). Since you have Medicare, you will need to get this from the pharmacy, as it is covered by Part D.  This is a series of 2 injections, spaced 2 months apart.  You need to wait at least 2 weeks after any other vaccine before getting the Shingrix vaccine.  Continue yearly mammograms. You are past due for colonoscopy with Dr. Loreta Ave  Please check with your insurance to see if there are any programs to help with your diet and weight. Consider free apps such as  MyFitness Pal, to help track your food intake Otherwise, when you are ready and can afford to commit to a treatment program, let us know if we can be of assistance. (Weight watchers or going back to [facility], seeing a nutritionist, etc).  You refused to have the pelvic/rectal portions of your exam today. Since you will not being seeing Dr. Edward Jolly, it is important that I continue this portion of your exams in the future.  Please check your sugars and record them on the sheet provided (or something like it). At least twice a week please check your sugars in the evenings (2 hours after a meal, or at  bedtime), rather than only checking in the mornings.  Please also record your blood pressure readings.  Goal blood pressure is 130/80 or less.  If it is running above that, please send [name] your list for my review.  Contact Dr. Kenna Gilbert office regarding colonoscopy--whether it is truly needed, and/or if there are screening alternatives.  Let her know your concerns regarding transportion.

## 2020-07-06 NOTE — Progress Notes (Signed)
Chief Complaint  Patient presents with  . Medicare Wellness    Fasting AWV with pelvic. (would rather not). Fell in Oct and hurt her right knee-no other concerns.    Kerri Richardson is a 80 y.o. female who presents for annual physical exam, Medicare wellness visit and follow-up on chronic medical conditions.  She last saw Dr. Quincy Simmonds for [facility] exam in 07/2018.  Did not have visit 07/2019 (cost/insurance);  GYN portion of exam should be performed today. Patient states she won't get up on the exam table, even with assistance.  Declines pelvic exam (or any exam on table).  Obesity: [facility].  She had lost 49# with them.  She had previously reported over the last year that she had gone back to some of her old ways (switched back to prior breads, dressings) and had regained a significant amount of weight since no longer going to [facility].  Since her last visit here, she has continued to gain weight. She reports she hardly eats anything.  Isn't active at all. She drinks water and Diet Sprite only. She reports she can't afford to go back to [facility] clinic. She has chronic knee pain, uses cane, and this was exacerbated after a fall in October. Wt Readings from Last 3 Encounters:  07/07/20 284 lb 9.6 oz (129.1 kg)  12/12/19 274 lb 12.8 oz (124.6 kg)  06/13/19 258 lb 12.8 oz (117.4 kg)   Diabetes follow-up:Diabetes has been well controlled. She needed less insulin when weight was down (12 U), in December 2020 she was using 15 U daily, and titrated up to 22U nightly at her last visit in June 2021.  She is currently taking 20 Units. Sugars have been running 67-115, mostly 80-100 in the mornings (fasting).  Denies hypoglycemic symptoms. Not checking other times of the day. (didn't bring list or meter) She tried the Freestyle (friend gave it to her), and read lower (60's in the mornings), lower in direct comparison to her meter (isn't sure  which was accurate); never had hypoglycemic symptoms.    She denies hypoglycemia, polydipsia or polyuria. She has urinary frequency at night (every 2 hours), unchanged, not as often during the day. Last eye examwas9/2021 (goes twice yearly).Patient follows a low sugar diet and checks feet regularly without concerns. Lab Results  Component Value Date   HGBA1C 6.3 (A) 12/12/2019   Hyperlipidemia follow-up: Patient is reportedly following a low-fat, low cholesterol diet. Her LDL was above goal in December 2020, on 60m of atorvastatin, so zetia was added.  Repeat lipids were at goal.  She is compliant with medications(atorvastatin and zetia)and denies medication side effects. Lab Results  Component Value Date   CHOL 126 09/13/2019   HDL 47 09/13/2019   LDLCALC 62 09/13/2019   TRIG 86 09/13/2019   CHOLHDL 2.7 09/13/2019   Hypertension follow-up:BP's at home have been "a little high", diastolic have been okay. She can't recall the numbers. Denies dizziness, headaches, chest pain. Denies side effects of medications. She reports compliance with amlodipine and enalapril (both 161mdaily). Hasn't been walking due to knee pain.  Vitamin D deficiency--Level was low when taking 2000 IU daily (27.9 in 05/2019), improved when taking 5000 IU daily (49.8 in 11/2019). She continues to take 5000 IU daily.  H/o toe pain--goutand degenerative changes. On allopurinol for prevention, with colchicine for prn use(hasn't needed in a long time). No longer having any discomfort in her toe. She  takes 126mof allopurinol(dose not increased due to impaired Cr and lack of gout symptoms on this dose).Denies any pain or swelling.  Her uric acid level had increased from 6.3 to 7.1 in 05/2019, down to 6.7 on recheck in 11/2019. Lab Results  Component Value Date   LABURIC 6.7 12/12/2019   CKD--She saw Dr. LHarrie Jeansin July 2020 (Dr. Powellretired). Last year she reported that her Lasix was decreased  to 1/2 tablet (255m three times/week (prev took daily), but due to recurrent swelling on days she didn't take the diuretic, she increased back to daily dosing.  Her BUN/Cr had bumped back up to (48/1.72 on check in 05/2019).  She was asked to increase water intake, and had f/u visit scheduled with Dr. FoRoyce Macadamiaor recheck.  We did not receive any further notes from Dr. FoRoyce MacadamiaShe reports she is now taking 1/2 tablet 3x/week, per her recommendation. No other changes. (records requested)  H/o colon polyps, noted last 11/2013. She is past due for 5 year f/u with Dr. MaCollene MaresShe never got notified by their office. She was asked to call and check at her physical last year. She states she is aware that she is due, but that she doesn't have anyone to take her to visits, won't ask anyone. She isn't speaking to her sister.  Her God-son could take her, but doesn't want him to have to take off work.  Immunization History  Administered Date(s) Administered  . Influenza Split 05/05/2011, 03/23/2012  . Influenza, High Dose Seasonal PF 04/03/2014, 02/20/2015, 03/29/2016, 03/11/2017, 04/28/2018, 03/14/2019  . Influenza-Unspecified 05/01/2020  . PFIZER(Purple Top)SARS-COV-2 Vaccination 07/03/2019, 07/23/2019, 05/01/2020  . Pneumococcal Conjugate-13 12/10/2013  . Pneumococcal Polysaccharide-23 01/26/2010  . Tdap 10/19/2007, 06/12/2018  . Zoster 10/19/2007   Last Pap smear:07/2017, normal, no high risk HPV (by Dr. SiQuincy SimmondsS/p LEEP for +HR HPV, final pathology LGSILin 05/2015 with Dr. SiQuincy SimmondsLast mammogram:12/2019 Last colonoscopy: 11/26/13, +polyp, Dr. MaCollene Mares Repeat was due 11/2018. Last DEXA: 01/2010--normal Ophtho:twice yearly Dentist: has dentures, and just one tooth; last seen 02/2013 (got new dentures) Exercise:None. Limited by R knee pain   Other doctors caring for patient include: Ophtho: Dr. GrKaty Fitchephro: Dr. LoHarrie JeansI: Dr. MaCollene MaresYN: Dr. SiQuincy Simmondsno longer sees) Ortho: Dr. BeTonita Congeight Loss:  Drs. Beasley/Kadolph (no longer sees)  Depression screen: Negative Fall screen: One, bruised R knee/shin Functional Status Survey: Notable forsome decrease in hearing, not worse than in the past. Some pain walking due to right knee pain (uses cane when walking far) Mini-Cog screen: normal See full screens in epic  End of Life Discussion: Patient has a living will and medical power of attorney, scanned into chart.   PMH, PSH, SH and FH were updated and reviewed.  Outpatient Encounter Medications as of 07/07/2020  Medication Sig Note  . ACCU-CHEK AVIVA PLUS test strip TEST BLOOD SUGAR TWICE DAILY   . Accu-Chek Softclix Lancets lancets TEST BLOOD SUGAR TWICE DAILY   . allopurinol (ZYLOPRIM) 100 MG tablet Take 1 tablet (100 mg total) by mouth daily.   . Marland KitchenmLODipine (NORVASC) 10 MG tablet Take 1 tablet (10 mg total) by mouth daily.   . Marland Kitchenspirin 81 MG tablet Take 81 mg by mouth daily.   . Marland Kitchentorvastatin (LIPITOR) 80 MG tablet TAKE 1 TABLET EVERY DAY   . Blood Glucose Monitoring Suppl (ACCU-CHEK NANO SMARTVIEW) w/Device KIT 1 each by Does not apply route 2 (two) times daily. BID   . brimonidine (ALPHAGAN) 0.2 % ophthalmic solution 1 drop 2 (  two) times daily.   . dorzolamide-timolol (COSOPT) 22.3-6.8 MG/ML ophthalmic solution 1 drop 2 (two) times daily.   . DROPLET INSULIN SYRINGE 31G X 5/16" 0.5 ML MISC USE EVERY DAY   . enalapril (VASOTEC) 10 MG tablet TAKE 1 TABLET EVERY DAY   . ezetimibe (ZETIA) 10 MG tablet TAKE 1 TABLET EVERY DAY   . furosemide (LASIX) 20 MG tablet Take 20 mg by mouth daily. 07/07/2020: 1/2 tab M,W and F  . insulin glargine (LANTUS) 100 UNIT/ML injection INJECT 20 UNITS EVERY NIGHT AT BEDTIME (DISCARD OPEN VIAL 28 DAYS AFTER FIRST OPEN)   . latanoprost (XALATAN) 0.005 % ophthalmic solution Place 1 drop into both eyes at bedtime.   . pioglitazone (ACTOS) 30 MG tablet TAKE 1 TABLET EVERY DAY   . VITAMIN D PO Take 2,000 Int'l Units by mouth daily. 12/12/2019: Taking 5000 IU  daily   No facility-administered encounter medications on file as of 07/07/2020.   No Known Allergies   ROS: The patient denies anorexia, fever, headaches, vision changes, ear pain, sore throat, breast concerns, chest pain, palpitations, dizziness, syncope, dyspnea on exertion, cough, nausea, vomiting, diarrhea, constipation, abdominal pain, melena, hematochezia, indigestion/heartburn, hematuria, incontinence, dysuria, vaginal bleeding, discharge, odor or itch, genital lesions, deniesnumbness, tingling, weakness, tremor, suspicious skin lesions, depression, anxiety, abnormal bleeding or enlarged lymph nodes. Right knee pain with walking only, no swelling or giving way. Some decrease in hearing, not worsening. Some swelling in her feet only when she sits too long, occasionally Urinating at night, up every 2 hours. No frequency during day. Left shoulder pain since using the Freestyle monitor. Had some swelling and discomfort related to her COVID booster. Weight gain--10# in the last 6 mos, 30# in the last year No longer having any toe pain   PHYSICAL EXAM:  BP 138/68   Pulse 60   Ht '5\' 2"'  (1.575 m)   Wt 284 lb 9.6 oz (129.1 kg)   LMP 06/14/1998 (Approximate)   BMI 52.05 kg/m   Wt Readings from Last 3 Encounters:  07/07/20 284 lb 9.6 oz (129.1 kg)  12/12/19 274 lb 12.8 oz (124.6 kg)  06/13/19 258 lb 12.8 oz (117.4 kg)  Weight was 248# at her CPE 05/2018, and 285# 6.4oz at CPE 05/2017  General Appearance:   Alert, cooperative, no distress, appears stated age. Morbidly obese.  Head:   Normocephalic, without obvious abnormality, atraumatic  Eyes:   PERRL, conjunctiva/corneas clear, EOM's intact, fundi not well visualized  Ears:   Normal TM's and external ear canals. Partially obstructive cerumen noted on the left. L TM not well visualized.  Nose:  Not examined, wearing mask due to COVID-19 pandemic  Throat:  Not examined, wearing mask due to COVID-19 pandemic  Neck:   Supple, no lymphadenopathy; thyroid: no enlargement/ tenderness/nodules; no carotid bruit or JVD  Back:  Spine nontender, no curvature, ROM normal, no CVAtenderness  Lungs:   Clear to auscultation bilaterally without wheezes, rales orronchi; respirations unlabored  Chest Wall:   No tenderness or deformity  Heart:   Regular rate and rhythm, S1 and S2 normal, no murmur, rub or gallop  Breast Exam:  Normal appearance, no masses or tenderness, Exam performed in chair per pt request. No nipple retraction or dimpling, No nipple discharge or bleeding, No axillary lymphadenopathy  Abdomen:   Soft, obese, non-tender, nondistended, normoactive bowel sounds, no masses, no hepatosplenomegaly.  Genitalia:  Patient refused exam  Rectal:  Patient refused exam  Extremities:  No clubbing, cyanosis. Trace edema. Normal  sensation to monofilament. No lesions  Pulses:  2+ and symmetric all extremities  Skin:  Skin color, texture, turgor normal, no rashes or lesions  Lymph nodes:  Cervical, supraclavicular nodes normal  Neurologic:  Normal strength, sensation; reflexes 2+ and symmetric throughout. Walks with cane.  Psych: Normal mood, affect, hygiene and grooming  Normal diabetic foot exam   Lab Results  Component Value Date   HGBA1C 6.2 (A) 07/07/2020     ASSESSMENT/PLAN:  Annual physical exam  Encounter for Medicare annual wellness exam  Gout involving toe of right foot, unspecified cause, unspecified chronicity - asymptomatic, on 188m allopurinol daily, continue  Type 2 diabetes mellitus with stage 3b chronic kidney disease, with long-term current use of insulin ([place] - DM is controlled on current regimen. CKD from HTN and DM.  - Plan: HgB A1c, Comprehensive metabolic panel, TSH, Microalbumin / creatinine urine ratio, pioglitazone (ACTOS) 30 MG tablet  Vitamin D deficiency - no longer on Rx. cont daily supplement  Hyperuricemia  Essential  hypertension - borderline today (goal <130/80), possibly higher at home. Monitor/record. Contact u[place] nephro with values if above goal  Class 3 severe obesity due to excess calories with serious comorbidity and body mass index (BMI) of 50.0 to 59.9 in adult (HCC) - comorbidities--knee pain/OA, HTN, DM, HLD. Encouraged to go back to [facility] diet, consider [employer] check insurance to see if they have programs to help ($ issue)  Polyp of colon, unspecified part of colon, unspecified type - past due for f/u with Dr. MCollene Mares To contact her to discuss what options would be appropriate at this point  Need for pneumococcal vaccination - Plan: Pneumococcal polysaccharide vaccine 23-valent greater than or equal to 2yo subcutaneous/IM  Need for hepatitis C screening test - Plan: Hepatitis C antibody  Medication monitoring encounter - Plan: Comprehensive metabolic panel, Lipid panel, CBC with Differential/Platelet  Mixed hyperlipidemia - at goal on current regimen - Plan: Lipid panel, ezetimibe (ZETIA) 10 MG tablet  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 ([place]  Counseled re: obesity--encouraged [facility] or [employer] needs accountability. She cannot afford these.  Discussed free apps such as LoseIt or MyFitness Pal. Resume prior diet (higher protein, lower carbs). Discussed slowed metabolism and harder to lose weight if eating <1000 calories daily.  Needs to eat small frequent meals/snacks, high in protein, low in carbs. Discussed chair exercises to get her cardio and weight-bearing exercise. Discussed the risks associated with morbid obesity.  Gets amlodipine, enalapril and lasix from nephro  Discussed monthly self breast exams and yearly mammograms; at least 30 minutes of aerobic activity at least 5 days/week, weight-bearing exercise 2x/week; proper sunscreen use reviewed; healthy diet, including goals of calcium and vitamin D intake and alcohol recommendations (less than or equal to 1 drink/day)  reviewed; regular seatbelt use; changing batteries in smoke detectors. Immunization recommendations discussed--continueyearly high dose flu shots.Pneumovax booster given today. Shingrix recommended, to get at pharmacy, risks SE reviewed. COVID vaccine recommended when available. Colonoscopy recommendations reviewed--was due 11/2018. Pt to contact Dr. MLorie Apleyoffice to discuss if alternatives are appropriate, vs if she needs to ask her god-son to drive her (discussed holidays that doctors offices may be open, that he may not need to work).  MOST form reviewed/updated today. Unchanged. Full Code, Full Care.  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 Dose reduced to 231mMWF  due to pre-renal losses and persistently elevated BUN. Amlodipine dose was decreased to 37m (due to swelling)--to call in a week with BP update, and to add metoprolol if BP elevated. (unsure if patient ever did this--she did NOT indicate the lower dosage of amlodipine to u[place]oday). No labs sent--just urine microalb/cr (reviewed after we already sent off today).  Medicare Attestation I have personally reviewed: The patient's medical and social history Their use of alcohol, tobacco or illicit drugs Their current medications and supplements The patient's functional ability including ADLs,fall risks, home safety risks, cognitive, and hearing and visual impairment Diet and physical activities Evidence for depression or mood disorders  The patient's weight, height, BMI have been recorded in the chart.  I have made referrals, counseling, and provided education to the patient based on review of the above and I have provided the patient with a written personalized care plan for preventive services.

## 2020-07-07 ENCOUNTER — Other Ambulatory Visit: Payer: Self-pay

## 2020-07-07 ENCOUNTER — Ambulatory Visit: Payer: Medicare PPO | Admitting: Family Medicine

## 2020-07-07 ENCOUNTER — Encounter: Payer: Self-pay | Admitting: Family Medicine

## 2020-07-07 VITALS — BP 138/68 | HR 60 | Ht 62.0 in | Wt 284.6 lb

## 2020-07-07 DIAGNOSIS — K635 Polyp of colon: Secondary | ICD-10-CM | POA: Diagnosis not present

## 2020-07-07 DIAGNOSIS — Z0000 Encounter for general adult medical examination without abnormal findings: Secondary | ICD-10-CM

## 2020-07-07 DIAGNOSIS — Z1159 Encounter for screening for other viral diseases: Secondary | ICD-10-CM | POA: Diagnosis not present

## 2020-07-07 DIAGNOSIS — N183 Chronic kidney disease, stage 3 unspecified: Secondary | ICD-10-CM | POA: Diagnosis not present

## 2020-07-07 DIAGNOSIS — Z23 Encounter for immunization: Secondary | ICD-10-CM | POA: Diagnosis not present

## 2020-07-07 DIAGNOSIS — I129 Hypertensive chronic kidney disease with stage 1 through stage 4 chronic kidney disease, or unspecified chronic kidney disease: Secondary | ICD-10-CM

## 2020-07-07 DIAGNOSIS — Z794 Long term (current) use of insulin: Secondary | ICD-10-CM | POA: Diagnosis not present

## 2020-07-07 DIAGNOSIS — M109 Gout, unspecified: Secondary | ICD-10-CM | POA: Diagnosis not present

## 2020-07-07 DIAGNOSIS — I10 Essential (primary) hypertension: Secondary | ICD-10-CM

## 2020-07-07 DIAGNOSIS — E1122 Type 2 diabetes mellitus with diabetic chronic kidney disease: Secondary | ICD-10-CM

## 2020-07-07 DIAGNOSIS — Z5181 Encounter for therapeutic drug level monitoring: Secondary | ICD-10-CM

## 2020-07-07 DIAGNOSIS — E559 Vitamin D deficiency, unspecified: Secondary | ICD-10-CM | POA: Diagnosis not present

## 2020-07-07 DIAGNOSIS — Z6841 Body Mass Index (BMI) 40.0 and over, adult: Secondary | ICD-10-CM

## 2020-07-07 DIAGNOSIS — E782 Mixed hyperlipidemia: Secondary | ICD-10-CM

## 2020-07-07 DIAGNOSIS — N1832 Chronic kidney disease, stage 3b: Secondary | ICD-10-CM

## 2020-07-07 DIAGNOSIS — E790 Hyperuricemia without signs of inflammatory arthritis and tophaceous disease: Secondary | ICD-10-CM | POA: Diagnosis not present

## 2020-07-07 LAB — POCT GLYCOSYLATED HEMOGLOBIN (HGB A1C): Hemoglobin A1C: 6.2 % — AB (ref 4.0–5.6)

## 2020-07-07 MED ORDER — PIOGLITAZONE HCL 30 MG PO TABS: 30.0000 mg | ORAL_TABLET | Freq: Every day | ORAL | 1 refills | Status: DC

## 2020-07-07 MED ORDER — EZETIMIBE 10 MG PO TABS: 10.0000 mg | ORAL_TABLET | Freq: Every day | ORAL | 1 refills | Status: DC

## 2020-07-08 NOTE — Progress Notes (Signed)
Advise pt--Cr went from 1.4 to 1.79. Send labs to Dr. Malen Gauze at [facility]. I reviewed notes--she had recommended decreasing amlodipine to 5mg , she was to call them with BP's, and if BP's high, was going to start a different medicine. This was to help decrease swelling.  Unsure if any of this was done. Confirm if she is taking 10 vs 5mg  of amlodipine (we have 10 in chart, full tablet).  If her BP's are running high at home, as she reported, this is contributing to worsening kidney function, and she should f/u with Dr. regarding this.  Electrolytes and liver tests were normal.  Lipids remain at goal, cont the 2 cholesterol meds. HDL dropped, likely related to lack of exercise.  She should do the chair exercises as we discussed. Blood counts and thyroid tests are normal.   Urine test was normal. (Hep C still pending)

## 2020-07-09 LAB — CBC WITH DIFFERENTIAL/PLATELET
Basophils Absolute: 0.1 10*3/uL (ref 0.0–0.2)
Basos: 1 %
EOS (ABSOLUTE): 0.2 10*3/uL (ref 0.0–0.4)
Eos: 3 %
Hematocrit: 43.4 % (ref 34.0–46.6)
Hemoglobin: 13.5 g/dL (ref 11.1–15.9)
Immature Grans (Abs): 0 10*3/uL (ref 0.0–0.1)
Immature Granulocytes: 0 %
Lymphocytes Absolute: 1.5 10*3/uL (ref 0.7–3.1)
Lymphs: 24 %
MCH: 27.7 pg (ref 26.6–33.0)
MCHC: 31.1 g/dL — ABNORMAL LOW (ref 31.5–35.7)
MCV: 89 fL (ref 79–97)
Monocytes Absolute: 0.7 10*3/uL (ref 0.1–0.9)
Monocytes: 11 %
Neutrophils Absolute: 3.9 10*3/uL (ref 1.4–7.0)
Neutrophils: 61 %
Platelets: 199 10*3/uL (ref 150–450)
RBC: 4.88 x10E6/uL (ref 3.77–5.28)
RDW: 13.8 % (ref 11.7–15.4)
WBC: 6.3 10*3/uL (ref 3.4–10.8)

## 2020-07-09 LAB — COMPREHENSIVE METABOLIC PANEL
ALT: 14 IU/L (ref 0–32)
AST: 21 IU/L (ref 0–40)
Albumin/Globulin Ratio: 1.6 (ref 1.2–2.2)
Albumin: 4.2 g/dL (ref 3.7–4.7)
Alkaline Phosphatase: 74 IU/L (ref 44–121)
BUN/Creatinine Ratio: 20 (ref 12–28)
BUN: 35 mg/dL — ABNORMAL HIGH (ref 8–27)
Bilirubin Total: 0.5 mg/dL (ref 0.0–1.2)
CO2: 18 mmol/L — ABNORMAL LOW (ref 20–29)
Calcium: 9.4 mg/dL (ref 8.7–10.3)
Chloride: 102 mmol/L (ref 96–106)
Creatinine, Ser: 1.79 mg/dL — ABNORMAL HIGH (ref 0.57–1.00)
GFR calc Af Amer: 31 mL/min/{1.73_m2} — ABNORMAL LOW (ref >59)
GFR calc non Af Amer: 27 mL/min/{1.73_m2} — ABNORMAL LOW (ref >59)
Globulin, Total: 2.6 g/dL (ref 1.5–4.5)
Glucose: 110 mg/dL — ABNORMAL HIGH (ref 65–99)
Potassium: 5.2 mmol/L (ref 3.5–5.2)
Sodium: 139 mmol/L (ref 134–144)
Total Protein: 6.8 g/dL (ref 6.0–8.5)

## 2020-07-09 LAB — TSH: TSH: 2.9 u[IU]/mL (ref 0.450–4.500)

## 2020-07-09 LAB — HEPATITIS C ANTIBODY: Hep C Virus Ab: <0.1 s/co ratio (ref 0.0–0.9)

## 2020-07-09 LAB — LIPID PANEL
Chol/HDL Ratio: 2.8 ratio (ref 0.0–4.4)
Cholesterol, Total: 108 mg/dL (ref 100–199)
HDL: 39 mg/dL — ABNORMAL LOW (ref >39)
LDL Chol Calc (NIH): 51 mg/dL (ref 0–99)
Triglycerides: 94 mg/dL (ref 0–149)
VLDL Cholesterol Cal: 18 mg/dL (ref 5–40)

## 2020-07-09 LAB — MICROALBUMIN / CREATININE URINE RATIO
Creatinine, Urine: 486.8 mg/dL
Microalb/Creat Ratio: 11 mg/g creat (ref 0–29)
Microalbumin, Urine: 51.7 ug/mL

## 2020-07-28 ENCOUNTER — Other Ambulatory Visit: Payer: Self-pay | Admitting: Family Medicine

## 2020-07-28 DIAGNOSIS — E1169 Type 2 diabetes mellitus with other specified complication: Secondary | ICD-10-CM

## 2020-07-30 DIAGNOSIS — N1832 Chronic kidney disease, stage 3b: Secondary | ICD-10-CM | POA: Diagnosis not present

## 2020-08-14 ENCOUNTER — Other Ambulatory Visit: Payer: Self-pay | Admitting: Family Medicine

## 2020-08-14 DIAGNOSIS — M109 Gout, unspecified: Secondary | ICD-10-CM

## 2020-09-01 DIAGNOSIS — H401132 Primary open-angle glaucoma, bilateral, moderate stage: Secondary | ICD-10-CM | POA: Diagnosis not present

## 2020-10-01 ENCOUNTER — Other Ambulatory Visit: Payer: Self-pay | Admitting: Family Medicine

## 2020-10-01 DIAGNOSIS — E785 Hyperlipidemia, unspecified: Secondary | ICD-10-CM

## 2020-10-01 DIAGNOSIS — E1169 Type 2 diabetes mellitus with other specified complication: Secondary | ICD-10-CM

## 2020-10-16 ENCOUNTER — Other Ambulatory Visit: Payer: Self-pay | Admitting: Family Medicine

## 2020-11-26 ENCOUNTER — Other Ambulatory Visit: Payer: Self-pay | Admitting: Family Medicine

## 2020-11-26 DIAGNOSIS — E782 Mixed hyperlipidemia: Secondary | ICD-10-CM

## 2020-11-26 DIAGNOSIS — Z794 Long term (current) use of insulin: Secondary | ICD-10-CM

## 2020-11-26 DIAGNOSIS — Z1231 Encounter for screening mammogram for malignant neoplasm of breast: Secondary | ICD-10-CM

## 2020-12-10 ENCOUNTER — Other Ambulatory Visit: Payer: Self-pay | Admitting: Family Medicine

## 2020-12-10 DIAGNOSIS — E785 Hyperlipidemia, unspecified: Secondary | ICD-10-CM

## 2020-12-23 DIAGNOSIS — E559 Vitamin D deficiency, unspecified: Secondary | ICD-10-CM | POA: Diagnosis not present

## 2020-12-23 DIAGNOSIS — E1122 Type 2 diabetes mellitus with diabetic chronic kidney disease: Secondary | ICD-10-CM | POA: Diagnosis not present

## 2020-12-23 DIAGNOSIS — N1832 Chronic kidney disease, stage 3b: Secondary | ICD-10-CM | POA: Diagnosis not present

## 2020-12-23 DIAGNOSIS — I129 Hypertensive chronic kidney disease with stage 1 through stage 4 chronic kidney disease, or unspecified chronic kidney disease: Secondary | ICD-10-CM | POA: Diagnosis not present

## 2020-12-23 LAB — VITAMIN D 25 HYDROXY (VIT D DEFICIENCY, FRACTURES): Vit D, 25-Hydroxy: 54

## 2020-12-23 LAB — MICROALBUMIN, URINE
Microalb, Ur: 3.2
Microalb, Ur: 32

## 2020-12-23 LAB — BASIC METABOLIC PANEL: Creatinine: 1.5 — AB (ref <=1.1)

## 2020-12-30 ENCOUNTER — Other Ambulatory Visit: Payer: Self-pay | Admitting: Family Medicine

## 2020-12-30 DIAGNOSIS — M109 Gout, unspecified: Secondary | ICD-10-CM

## 2021-01-04 NOTE — Progress Notes (Signed)
Chief Complaint  Patient presents with   Diabetes    Fasting med check. Saw Dr.Foster a couple of weeks ago-she said they did some labs and new medication given. Supposed to go back and have more labs done. Having a really hard time walking-asking for a walker and handicapped placard. (Could barely walk to room with me). She mentioned she is taking something that was 18m and is now taking 558mread your note so maybe metoprolol?   She is having trouble walking--she reports that she hasn't been exercising and feels like she has gotten weak. She isn't having as much knee pain as in the past, feels just more generalized weakness as cause for her difficulty.  She was asking for handicapped placard, but is interested in getting stronger and being able to walk better. She reports falling last month (tripped over a chair--bruised her arms, no other injury). Denies any pain in hips/knees, just gets out of breath easily with walking.  She has been using a cane, asking about a walker.  She is complaining of trouble swallowing x 3 weeks, feeling like something is lodged in her chest.  She is eating and drinking fine, but can only eat small amounts (solids, no problems with liquids) before she feels like things are backing up. Denies any heartburn. Denies nausea. She feels a "fullness" or tightness in her chest.  Denies any relation to activity. Not moving bowels every day, but isn't hard and denies any straining.  Diabetes follow-up: She is currently taking 20 Units of Lantus insulin, and 3027mf pioglitazone. Sugars had been in the 170's for about 2 weeks, but in the last week or so they are back down, 110-120. She only checks sugars in the mornings. She denies any changes in diet or concurrent infection/illness, isn't aware of why her sugars were high. Sugars are now better.  Reports she is fasting today (afternoon appointment). Didn't bring list or meter. She denies hypoglycemia, polydipsia or polyuria.   She has urinary frequency at night (every 2 hours), unchanged, not as often during the day. She was started on a beta blocker by her nephrologist (see below). Eye exam UTD. Patient follows a low sugar diet and checks feet regularly without concerns.   Lab Results  Component Value Date   HGBA1C 6.2 (A) 07/07/2020    Hyperlipidemia follow-up: Patient is reportedly following a low-fat, low cholesterol diet. Her LDL was above goal in December 2020, on 26m62m atorvastatin, so zetia was added.  She is compliant with medications (atorvastatin and zetia) and denies medication side effects.  Lipids were at goal on last check: Lab Results  Component Value Date   CHOL 108 07/07/2020   HDL 39 (L) 07/07/2020   LDLCALC 51 07/07/2020   TRIG 94 07/07/2020   CHOLHDL 2.8 07/07/2020     Hypertension follow-up: She has CKD and per Dr. FostLuis Abedphro) 12/2019 notes, some changes were to be made to her regimen. She was trying to cut Lasix down to MWF (related to pre-renal changes on labs), and if couldn't tolerate that due to recurrent edema, she was going to try and cut the amlodipine dose (thinking that might be what was causing the swelling), and add metoprolol if needed for better BP control. Patient was supposed to contact her in a week (with her BP's, after decreasing the amlodipine and lasix). We never got records.  She states she was put on Magnesium, and is due for recheck of labs in August. At one point her  vitamin D was stopped, but she was told to restart it at 1000 IU.  Someone accidentally picked up 2000 IU dose for her, and she has been taking that daily. She reports taking amlodipine 16m enalapril 191m lasix 1054mWF, and new medication (?20m107m metoprolol BID)  BP's at home have been not checked--monitor is broken.  Denies dizziness, headaches. Denies side effects of medications. Denies wheezing. Some chest tightness as reported above which she thinks is in her esophagus, but also some  DOE.  H/o toe pain--gout and degenerative changes. On allopurinol for prevention, with colchicine for prn use (hasn't needed in a long time).  No longer having any discomfort in her toe. She takes 100mg53mallopurinol (dose not increased due to impaired Cr and lack of gout symptoms on this dose). Denies any pain or swelling.  Her uric acid level had increased from 6.3 to 7.1 in 05/2019, down to 6.7 on recheck in 11/2019. Due for recheck. Lab Results  Component Value Date   LABURIC 6.7 12/12/2019     CKD--Under the care of Dr. Lori Harrie Jeanse Dr. PowelFlorene Glenred. Seen a few weeks ago, notes not yet received.  See med changes as reported under HTN. Lab Results  Component Value Date   CREATININE 1.79 (H) 07/07/2020     Obesity: She had lost 49# [facility], stopped in 08/2018. She has gained a lot of weight.  She gets no exercise. She drinks water and Diet Sprite only. [facility] Wt Readings from Last 3 Encounters:  07/07/20 284 lb 9.6 oz (129.1 kg)  12/12/19 274 lb 12.8 oz (124.6 kg)  06/13/19 258 lb 12.8 oz (117.4 kg)    H/o colon polyps, noted last 11/2013.  She is past due for 5 year f/u with Dr. Mann.Collene Maresher physical in 06/2020 she reported being aware that she is due, but that she doesn't have anyone to take her to visits, won't ask anyone. She isn't speaking to her sister.  Her God-son could take her, but doesn't want him to have to take off work.    PMH, PSH, SH reviewed  Outpatient Encounter Medications as of 01/05/2021  Medication Sig Note   ACCU-CHEK AVIVA PLUS test strip TEST BLOOD SUGAR TWICE DAILY    Accu-Chek Softclix Lancets lancets TEST BLOOD SUGAR TWICE DAILY    allopurinol (ZYLOPRIM) 100 MG tablet TAKE 1 TABLET EVERY DAY    amLODipine (NORVASC) 10 MG tablet Take 1 tablet (10 mg total) by mouth daily.    aspirin 81 MG tablet Take 81 mg by mouth daily.    Blood Glucose Monitoring Suppl  (ACCU-CHEK NANO SMARTVIEW) w/Device KIT 1 each by Does not apply route 2 (two) times daily. BID    brimonidine (ALPHAGAN) 0.2 % ophthalmic solution 1 drop 2 (two) times daily.    Cholecalciferol (VITAMIN D) 50 MCG (2000 UT) CAPS Take 1 capsule by mouth daily.    dorzolamide-timolol (COSOPT) 22.3-6.8 MG/ML ophthalmic solution 1 drop 2 (two) times daily.    DROPLET INSULIN SYRINGE 31G X 5/16" 0.5 ML MISC USE EVERY DAY    enalapril (VASOTEC) 10 MG tablet TAKE 1 TABLET EVERY DAY    ezetimibe (ZETIA) 10 MG tablet TAKE 1 TABLET EVERY DAY    furosemide (LASIX) 20 MG tablet Take 20 mg by mouth daily. 07/07/2020: 1/2 tab M,W and F   insulin glargine (LANTUS) 100 UNIT/ML injection INJECT 20  UNITS EVERY NIGHT AT BEDTIME (DISCARD OPEN VIAL 28 DAYS AFTER FIRST OPEN)    latanoprost (XALATAN) 0.005 % ophthalmic solution Place 1 drop into both eyes at bedtime.    Magnesium Oxide 400 MG CAPS Take 2 capsules by mouth daily.    metoprolol tartrate (LOPRESSOR) 50 MG tablet Take 50 mg by mouth 2 (two) times daily.    pioglitazone (ACTOS) 30 MG tablet TAKE 1 TABLET EVERY DAY    [DISCONTINUED] atorvastatin (LIPITOR) 80 MG tablet TAKE 1 TABLET EVERY DAY    [DISCONTINUED] VITAMIN D PO Take 2,000 Int'l Units by mouth daily. 12/12/2019: Taking 5000 IU daily   atorvastatin (LIPITOR) 80 MG tablet Take 1 tablet (80 mg total) by mouth daily.    No facility-administered encounter medications on file as of 01/05/2021.   No Known Allergies  ROS: Denies fever, chills, URI symptoms, headaches, dizziness.  Denies nausea, vomiting, bowel changes (just not going daily, but no constipation), urinary complaints, bleeding, bruising, rash. Knee pain has improved some.  Hasn't been walking No further toe pain. Denies any edema. Dysphagia and chest tightness, and generalized weakness with some DOE, difficulty walking. See HPI  PHYSICAL EXAM:  BP 122/64   Pulse 68   Ht _0  (1.575 m)   Wt 279 lb (126.6 kg)   LMP 06/14/1998  (Approximate)   BMI 51.03 kg/m   Wt Readings from Last 3 Encounters:  01/05/21 279 lb (126.6 kg)  07/07/20 284 lb 9.6 oz (129.1 kg)  12/12/19 274 lb 12.8 oz (124.6 kg)    Well developed, pleasant, morbidly obese female, comfortable at rest. She was unable to get up on exam table--didn't have the strength to step up (could get one foot up, but not lift her body and get other foot up, despite assistance from 2 people). HEENT: conjunctiva and sclera are clear, EOMI, wearing mask. Neck: no lymphadenopathy or mass, no bruit Heart: regular rate and rhythm Lungs: clear bilaterally Back: no spinal or CVA tenderness Abdomen: obese, soft, nontender, no mass Extremities: Trace to 1+ edema L>R Psych: normal mood, affect, hygiene and grooming Neuro: alert and oriented. Difficulty walking (generalized weakness) Skin: normal turgor, no rash or lesions   Glucose 153, pt reports fasting all day  Lab Results  Component Value Date   HGBA1C 7.3 (A) 01/05/2021    Nephro notes received during visit--from visit 12/23/20 Microalb/Cr ratio 149.5, K 4.7, Cr 1.47, Mg 1.5 Intac PTH 84, Vit D-OH 54 (Started taking D 2000 IU after this visit) On metoprolol tartrate 42m BID (increased at that visit from 25 mg BID)  Pt requested Mg to be rechecked, said she was due to have these done for nephro, since Mg supplement was started.   EKG RBBB, LVH Compared to prior EKGs from 2018 and 2019--more significant T wave inversion at III V4,5,6 very flat, ?due to technique (seated, somewhat reclined, limb leads on thighs)   ASSESSMENT/PLAN:  Type 2 diabetes mellitus with stage 3b chronic kidney disease, with long-term current use of insulin (HCC) - A1c higher, sugars were high for a bit, improved per fglu, but high today. Monitor more frequently, and titrate Lantus (cautiously, now on BB, may mask hypoglyc - Plan: HgB A1c, Glucose (CBG)  Gout involving toe of right foot, unspecified cause, unspecified  chronicity - no longer having pain. due for recheck uric acid (borderline/high in past) - Plan: Uric acid  Hyperuricemia  Essential hypertension - BP is well controlled on current regimen per nephro. She has mild edema today  Polyp of colon, unspecified part of colon, unspecified type - past due for colonoscopy. Referring back to Dr. Collene Mares now that she is also having dysphagia - Plan: Ambulatory referral to Gastroenterology  Mixed hyperlipidemia - cont current meds, lowfat diet  Need for COVID-19 vaccine - Plan: PFIZER Comirnaty(GRAY TOP)COVID-19 Vaccine  Hypomagnesemia - on supplements, due for recheck. Will forward results to nephro - Plan: Magnesium  Medication monitoring encounter - Plan: Magnesium, Uric acid  Gait difficulty - No longer from knee pain, but from generalized weakness, deconditioning. Refer to PT, who can assess for proper walker (likely needs seat) - Plan: Ambulatory referral to Physical Therapy  History of fall - Plan: Ambulatory referral to Physical Therapy  Obesity, morbid, BMI 50 or higher ([place]) - Plan: Ambulatory referral to Physical Therapy  Hyperlipidemia associated with type 2 diabetes mellitus ([place]) - cont current regimen - Plan: atorvastatin (LIPITOR) 80 MG tablet  Dyspnea on exertion - suspect related to deconditioning and obesity.  There were some EKG changes (though not ideal positioning), and given DM, would benefit from cardiac eval - Plan: EKG 12-Lead, Ambulatory referral to Cardiology  Chest pressure - Plan: EKG 12-Lead  Dysphagia, unspecified type - of new onset.  Refer back to Dr. Collene Mares (past due to see for colonoscopy).  Start Pepcid BID before meals (rather than PPI due to CKD) - Plan: Ambulatory referral to Gastroenterology  Pt now on BB--?if any hypoglycemia can be contributing to her weakness. Doubt, since had trouble walking today, was fasting all day and glu was elevated.   She was encouraged to check sugars more frequently, and to contact  us if they are persistently elevated (as they were recently) for dosing adjustments.    F/u CPE/AWV 6 mos, fasting, sooner prn.  Referred back to cardiology for eval, due to DOE, EKG changes. Referred to PT due to weakness/deconditioning, and assessment for her needs (walker with seat?) Referred to GI for dysphagia, and past due for colonoscopy.  I spent 50 minutes dedicated to the care of this patient, including pre-visit review of records, face to face time, post-visit ordering of testing and documentation.

## 2021-01-05 ENCOUNTER — Encounter: Payer: Self-pay | Admitting: Family Medicine

## 2021-01-05 ENCOUNTER — Ambulatory Visit: Payer: Medicare PPO | Admitting: Family Medicine

## 2021-01-05 ENCOUNTER — Other Ambulatory Visit: Payer: Self-pay

## 2021-01-05 VITALS — BP 122/64 | HR 68 | Ht 62.0 in | Wt 279.0 lb

## 2021-01-05 DIAGNOSIS — E1169 Type 2 diabetes mellitus with other specified complication: Secondary | ICD-10-CM

## 2021-01-05 DIAGNOSIS — M109 Gout, unspecified: Secondary | ICD-10-CM

## 2021-01-05 DIAGNOSIS — Z23 Encounter for immunization: Secondary | ICD-10-CM | POA: Diagnosis not present

## 2021-01-05 DIAGNOSIS — R0609 Other forms of dyspnea: Secondary | ICD-10-CM

## 2021-01-05 DIAGNOSIS — R0600 Dyspnea, unspecified: Secondary | ICD-10-CM

## 2021-01-05 DIAGNOSIS — Z5181 Encounter for therapeutic drug level monitoring: Secondary | ICD-10-CM

## 2021-01-05 DIAGNOSIS — E785 Hyperlipidemia, unspecified: Secondary | ICD-10-CM

## 2021-01-05 DIAGNOSIS — R0789 Other chest pain: Secondary | ICD-10-CM | POA: Diagnosis not present

## 2021-01-05 DIAGNOSIS — N1832 Chronic kidney disease, stage 3b: Secondary | ICD-10-CM

## 2021-01-05 DIAGNOSIS — Z794 Long term (current) use of insulin: Secondary | ICD-10-CM

## 2021-01-05 DIAGNOSIS — E790 Hyperuricemia without signs of inflammatory arthritis and tophaceous disease: Secondary | ICD-10-CM | POA: Diagnosis not present

## 2021-01-05 DIAGNOSIS — I10 Essential (primary) hypertension: Secondary | ICD-10-CM

## 2021-01-05 DIAGNOSIS — K635 Polyp of colon: Secondary | ICD-10-CM | POA: Diagnosis not present

## 2021-01-05 DIAGNOSIS — E782 Mixed hyperlipidemia: Secondary | ICD-10-CM

## 2021-01-05 DIAGNOSIS — Z9181 History of falling: Secondary | ICD-10-CM

## 2021-01-05 DIAGNOSIS — R269 Unspecified abnormalities of gait and mobility: Secondary | ICD-10-CM

## 2021-01-05 DIAGNOSIS — E1122 Type 2 diabetes mellitus with diabetic chronic kidney disease: Secondary | ICD-10-CM | POA: Diagnosis not present

## 2021-01-05 DIAGNOSIS — R1310 Dysphagia, unspecified: Secondary | ICD-10-CM

## 2021-01-05 LAB — POCT GLYCOSYLATED HEMOGLOBIN (HGB A1C): Hemoglobin A1C: 7.3 % — AB (ref 4.0–5.6)

## 2021-01-05 LAB — GLUCOSE, POCT (MANUAL RESULT ENTRY): POC Glucose: 153 mg/dl — AB (ref 70–99)

## 2021-01-05 MED ORDER — ATORVASTATIN CALCIUM 80 MG PO TABS: 80.0000 mg | ORAL_TABLET | Freq: Every day | ORAL | 1 refills | Status: DC

## 2021-01-05 NOTE — Patient Instructions (Addendum)
Try and check sugars twice a day--continue checking every morning, and sometimes also check either before dinner or at bedtime. Please write these values down, with a column for your morning sugars and a separate column for evening sugars. Bring this list to all visits, and feel free to send it if your sugars are high or you have any concerns about it. In general, we like to titrate your Lantus dose to keep your morning sugars where they are now. Your current sugars are good.  Your A1c was up, related to that time period when your sugars were higher. Please continue to limit the sugars and carbs/starches in your diet.  You are past due for colonoscopy. Now that you are also having trouble with your swallowing, you really need to schedule an appointment with Dr. Loreta Ave.  You may need to have an upper endoscopy along with the colonoscopy.   In the meantime--you can try taking pepcid twice daily to see if this helps alleviate any of your chest/swallowing symptoms. Please call Dr. Loreta Ave and schedule an appointment to discuss this.

## 2021-01-06 LAB — URIC ACID: Uric Acid: 7.2 mg/dL (ref 3.1–7.9)

## 2021-01-06 LAB — MAGNESIUM: Magnesium: 2.2 mg/dL (ref 1.6–2.3)

## 2021-01-09 ENCOUNTER — Telehealth: Payer: Self-pay

## 2021-01-09 NOTE — Telephone Encounter (Signed)
Nurse from [facility] called stating that per Dr. Malen Gauze from a nephrology stand point it is ok to raise pts. Allopurinol from 100 mg - 200 mg if needed.

## 2021-01-10 NOTE — Telephone Encounter (Signed)
Please advise pt to increase the allopurinol to 211m daily (take 2 of the 1056mtablets). [facility], and schedule for b-met and uric acid level in 4-6 weeks, and enter future orders (gout, med mgmt).

## 2021-01-12 ENCOUNTER — Other Ambulatory Visit: Payer: Self-pay | Admitting: *Deleted

## 2021-01-12 ENCOUNTER — Encounter: Payer: Self-pay | Admitting: *Deleted

## 2021-01-12 DIAGNOSIS — Z79899 Other long term (current) drug therapy: Secondary | ICD-10-CM

## 2021-01-12 DIAGNOSIS — M109 Gout, unspecified: Secondary | ICD-10-CM

## 2021-01-12 MED ORDER — ALLOPURINOL 100 MG PO TABS: 200.0000 mg | ORAL_TABLET | Freq: Every day | ORAL | 0 refills | Status: DC

## 2021-01-12 NOTE — Telephone Encounter (Signed)
Spoke with patient and scheduled her for 02/18/21 for labs at 10:00. Placed future orders. She will increase allopurinol to 2 tabs daily-sent new rx for #180 to [facility].

## 2021-01-13 ENCOUNTER — Other Ambulatory Visit: Payer: Self-pay

## 2021-01-13 ENCOUNTER — Ambulatory Visit: Payer: Medicare PPO | Attending: Family Medicine

## 2021-01-13 DIAGNOSIS — M6281 Muscle weakness (generalized): Secondary | ICD-10-CM | POA: Diagnosis not present

## 2021-01-13 DIAGNOSIS — R2681 Unsteadiness on feet: Secondary | ICD-10-CM | POA: Diagnosis not present

## 2021-01-13 DIAGNOSIS — R5381 Other malaise: Secondary | ICD-10-CM | POA: Insufficient documentation

## 2021-01-13 NOTE — Therapy (Signed)
[facility] [address] [place], [place], [address] Phone: [phone]   Fax:  [phone]  Physical Therapy Evaluation  Patient Details  Name: Kerri Richardson MRN: [id number] Date of Birth: 01-16-1941 Referring Provider (PT): Joselyn Arrow, MD   Encounter Date: 01/13/2021   PT End of Session - 01/13/21 1553     Visit Number 1    Number of Visits 9    Date for PT Re-Evaluation 03/10/21    Authorization Type [employer] MCR    Progress Note Due on Visit 10    PT Start Time 1210    PT Stop Time 1305    PT Time Calculation (min) 55 min    Equipment Utilized During Treatment Gait belt    Activity Tolerance Patient tolerated treatment well;Patient limited by fatigue    Behavior During Therapy WFL for tasks assessed/performed             Past Medical History:  Diagnosis Date   Arthritis    CKD (chronic kidney disease) stage 3, GFR 30-59 ml/min (HCC)    Dr. Lowell Guitar   Diabetic retinopathy    DM retinopathy (HCC) 08/17/12   early   Dyspnea    Essential hypertension, benign    Glaucoma    Gout    Pure hypercholesterolemia    Type II or unspecified type diabetes mellitus without mention of complication, not stated as uncontrolled     Past Surgical History:  Procedure Laterality Date   BRAIN SURGERY  1969   [place] holes in skull to alleviate pressure on the brain (no shunt)   CATARACT EXTRACTION, BILATERAL  2012   Dr. Dione Booze   COLONOSCOPY  3/05, 11/2013   due again 2020   COLPOSCOPY N/A 05/27/2015   Procedure: COLPOSCOPY;  Surgeon: Patton Salles, MD;  Location: [facility];  Service: Gynecology;  Laterality: N/A;   LEEP N/A 05/27/2015   Procedure: LOOP ELECTROSURGICAL EXCISION PROCEDURE (LEEP);  Surgeon: Patton Salles, MD;  Location: [facility];  Service: Gynecology;  Laterality: N/A;   TONSILLECTOMY  age 74    There were no vitals filed for this visit.    Subjective Assessment - 01/13/21 1217     Subjective Pt  reports with primary c/o generalized weakness and deconditioning of insidious onset lasting for several weeks. She reports that just a few months ago she was able to grocery shop with a single-point cane, but she is now cautious about ambulating with the cane due to fear of falling. She also reports becoming short of breath very easily after about 30 feet requiring a seated rest break. She denies any pain at this time other than occasional L knee pain, which she states is an unrelated issue to why she is coming in. Cancer red flag screening negative.    Limitations House hold activities;Walking;Standing    How long can you sit comfortably? Unlimited    How long can you stand comfortably? 2-3 minutes    How long can you walk comfortably? 30 feet    Currently in Pain? No/denies                [facility] PT Assessment - 01/13/21 0001       Assessment   Medical Diagnosis Gait difficulty (R26.9), History of fall (Z91.81), Obesity, morbid, BMI 50 or higher (HCC) (E66.01)    Referring Provider (PT) Joselyn Arrow, MD    Onset Date/Surgical Date 12/23/20    Hand Dominance Right  Next MD Visit Mammogram on Tuesday    Prior Therapy Yes, for her R knee 7-8 years ago      Precautions   Precautions None      Restrictions   Weight Bearing Restrictions No      Balance Screen   Has the patient fallen in the past 6 months Yes    How many times? 1   The pt reports not noticing a misplaced chair, which she tripped on resulting in no significant injury   Has the patient had a decrease in activity level because of a fear of falling?  Yes    Is the patient reluctant to leave their home because of a fear of falling?  No      Home Nurse, mental healthnvironment   Living Environment Private residence    Living Arrangements Alone    Available Help at Discharge Family;Friend(s)    Type of Home House    Home Access Ramped entrance    Home Layout One level    Home Equipment Dorrane - single point;Shower seat      Prior Function    Level of Independence Independent with household mobility with device   with SPC   Vocation Retired    Leisure Production designer, theatre/television/filmTraveling      Cognition   Overall Cognitive Status Within Functional Limits for tasks assessed      Observation/Other Assessments   [facility]ations [facility] by staff as she was unable to ambulate into the clinic from her car with her [facility]    Focus on Therapeutic Outcomes (FOTO)  56%, estimated 71% by visit 8      Strength   Overall Strength Comments BIL shoulders and LE global MMT roughly 3+/5      Transfers   Five time sit to stand comments  24sec      6 Minute Walk- Baseline   6 Minute Walk- Baseline yes    BP (mmHg) 138/58    HR (bpm) 60    02 Sat (%RA) 97 %      6 Minute walk- Post Test   6 Minute Walk Post Test yes    BP (mmHg) 149/59    HR (bpm) 62    02 Sat (%RA) 97 %      6 minute walk test results    Aerobic Endurance Distance Walked 150   90 ft in 1:30, 30sec seated rest, 60 ft in 1:10, seated rest for remainder of 6 minutes                       Objective measurements completed on examination: See above findings.               PT Education - 01/13/21 1552     Education Details Educated on underlying health conditions/ activity level contributing to her deconditioning, as well as proper form with new exercises.    Person(s) Educated Patient    Methods Explanation;Demonstration;Handout    Comprehension Verbalized understanding;Returned demonstration              PT Short Term Goals - 01/13/21 1606       PT SHORT TERM GOAL #1   Title Pt will report understanding and adherence to HEP in order to promote independence in the management of her primary sxs.    Baseline HEP given at baseline    Time 4    Period Weeks    Status New    Target  Date 02/10/21               PT Long Term Goals - 01/13/21 1609       PT LONG TERM GOAL #1   Title Pt will achieve a FOTO score of 71% in order  to demonstrate improved functional ability as it relates to her generalized deconditioning.    Baseline 56%    Time 8    Period Weeks    Status New    Target Date 03/10/21      PT LONG TERM GOAL #2   Title Pt will demonstrate ability to ambulate 325ft in 6 minutes in order to grocery shop with minimal limitation.    Baseline 150 ft    Time 8    Period Weeks    Status New    Target Date 03/10/21      PT LONG TERM GOAL #3   Title Pt will achieve 5xSTS in under 15 seconds in order to demonstrate improved functional balance.    Baseline 24sec    Time 8    Period Weeks    Status New    Target Date 03/10/21      PT LONG TERM GOAL #4   Title Pt will demonstrate BIL global hip MMT = 4+/5 or greater in order to progress an independent strengthening regimen without limitation.    Baseline 3+/5 global LE strength    Time 8    Period Weeks    Status New    Target Date 03/10/21                    Plan - 01/13/21 1554     Clinical Impression Statement The pt is a pleasant 79yo F presenting with primary c/o generalized deconditioning. Upon assessment, the pt demonstrates weak BIL global LE, weak BIL shoulders, poor balance, and poor aerobic conditioning. She will benefit from skilled PT to address her primary impairments and return to a normal level of functioning with less limitation.    Personal Factors and Comorbidities Comorbidity 3+;Age    Comorbidities See medical hx    Examination-Activity Limitations Squat;Locomotion Level;Stairs;Stand;Carry;Transfers    Examination-Participation Restrictions Interpersonal Relationship;Volunteer;Community Activity;Shop;Laundry    Stability/Clinical Decision Making Stable/Uncomplicated    Clinical Decision Making Low    Rehab Potential Good    PT Frequency 1x / week    PT Duration 8 weeks    PT Treatment/Interventions ADLs/Self Care Home Management;Aquatic Therapy;Biofeedback;Cryotherapy;Electrical Stimulation;DME Instruction;Moist  Heat;Gait training;Stair training;Functional mobility training;Therapeutic activities;Therapeutic exercise;Balance training;Neuromuscular re-education;Manual techniques;Wheelchair mobility training;Patient/family education;Taping    PT Next Visit Plan Further assess balance Sharlene Motts), progress LE strengthening, aerobic conditioning    PT Home Exercise Plan [id number]    Consulted and Agree with Plan of Care Patient             Patient will benefit from skilled therapeutic intervention in order to improve the following deficits and impairments:  Abnormal gait, Cardiopulmonary status limiting activity, Decreased activity tolerance, Decreased balance, Decreased strength, Postural dysfunction, Improper body mechanics, Obesity, Decreased endurance, Difficulty walking  Visit Diagnosis: Physical deconditioning  Unsteadiness on feet  Muscle weakness (generalized)     Problem List Patient Active Problem List   Diagnosis Date Noted   Vitamin D deficiency 06/21/2018   Class 3 severe obesity with serious comorbidity and body mass index (BMI) of 40.0 to 44.9 in adult (HCC) 06/21/2018   Irregular heartbeat 08/10/2016   Acute gout 09/24/2015   Morbid obesity with body mass index of 45.0-49.9 in  adult [facility]) 06/10/2014   Type 2 diabetes mellitus without complication, with long-term current use of insulin (HCC) 03/25/2014   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 [place], [place], [address] Phone: [phone]   Fax:  [phone]  Name: Kerri Richardson MRN: [id number] Date of Birth: [birth year]  Referring diagnosis? Gait difficulty [R26.9], History of fall [Z91.81], Obesity, morbid, BMI 50 orhigher (HCC) [E66.01] Treatment diagnosis? (if different than referring diagnosis) Physical deconditioning (R53.81) What was this (referring dx) caused by? []  Surgery []  Fall [x]  Ongoing issue []  Arthritis []  Other: ____________  Laterality: []  Rt []  Lt [x]  Both  Check all possible CPT codes:      [x]  97110 (Therapeutic Exercise)  []  92507 (SLP Treatment)  [x]  97112 (Neuro Re-ed)   []  92526 (Swallowing Treatment)   [x]  97116 (Gait Training)   []  (Cognitive Training, 1st 15 minutes) [x]  97140 (Manual Therapy)   []  97130 (Cognitive Training, each add'l 15 minutes)  [x]  97530 (Therapeutic Activities)  []  Other, List CPT Code ____________    [x]  97535 (Self Care)       []  All codes above (97110 - 97535)  []  97012 (Mechanical Traction)  [x]  97014 (E-stim Unattended)  [x]  97032 (E-stim manual)  []  (Ionto)  []  97035 (Ultrasound)  []  97760 (Orthotic Fit) [x]  97750 (Physical Performance Training) [x]  (Aquatic Therapy) []  97034 (Contrast Bath) []  (Paraffin) []  97597 (Wound Care 1st 20 sq cm) []  97598 (Wound Care each add'l 20 sq cm) []  97016 (Vasopneumatic Device) []   ) []  (Prosthetic Training)   , PT, DPT 01/13/21 4:15 PM

## 2021-01-13 NOTE — Patient Instructions (Signed)
  [id number]

## 2021-01-20 ENCOUNTER — Ambulatory Visit
Admission: RE | Admit: 2021-01-20 | Discharge: 2021-01-20 | Disposition: A | Discharge status: Home / Self Care | Payer: Medicare PPO | Source: Ambulatory Visit | Attending: Family Medicine | Admitting: Family Medicine

## 2021-01-20 ENCOUNTER — Other Ambulatory Visit: Payer: Self-pay

## 2021-01-20 DIAGNOSIS — Z1231 Encounter for screening mammogram for malignant neoplasm of breast: Secondary | ICD-10-CM | POA: Diagnosis not present

## 2021-01-21 ENCOUNTER — Ambulatory Visit: Payer: Medicare PPO | Admitting: Physical Therapy

## 2021-01-21 ENCOUNTER — Encounter: Payer: Self-pay | Admitting: Physical Therapy

## 2021-01-21 DIAGNOSIS — R2681 Unsteadiness on feet: Secondary | ICD-10-CM | POA: Diagnosis not present

## 2021-01-21 DIAGNOSIS — M6281 Muscle weakness (generalized): Secondary | ICD-10-CM | POA: Diagnosis not present

## 2021-01-21 DIAGNOSIS — R5381 Other malaise: Secondary | ICD-10-CM | POA: Diagnosis not present

## 2021-01-21 NOTE — Therapy (Addendum)
[place], [place], [address] Phone: [phone]   Fax:  [phone]  PHYSICAL THERAPY UNPLANNED DISCHARGE SUMMARY   Visits from Start of Care: 2  Current functional level related to goals / functional outcomes: Current status unknown   Remaining deficits: Current status unknown   Education / Equipment: Pt has not returned since visit listed below  Patient goals were not assessed. Patient is being discharged due to not returning since the last visit.  (the below note was addended to include the above D/C summary on 02/24/21)  Patient Details  Name: Kerri Richardson Date of Birth: 1940/09/28 Referring Provider (PT): Rita Ohara, MD   Encounter Date: 01/21/2021   PT End of Session - 01/21/21 1050     Visit Number 2    Number of Visits 9    Date for PT Re-Evaluation 03/10/21    Authorization Type [employer] MCR    Progress Note Due on Visit 10    PT Start Time 1050    PT Stop Time 1130    PT Time Calculation (min) 40 min    Equipment Utilized During Treatment Gait belt    Activity Tolerance Patient tolerated treatment well;Patient limited by fatigue    Behavior During Therapy WFL for tasks assessed/performed             Past Medical History:  Diagnosis Date   Arthritis    CKD (chronic kidney disease) stage 3, GFR 30-59 ml/min ([place])    Dr. [name]   Diabetic retinopathy    DM retinopathy ([place]) 08/17/12   early   Dyspnea    Essential hypertension, benign    Glaucoma    Gout    Pure hypercholesterolemia    Type II or unspecified type diabetes mellitus without mention of complication, not stated as uncontrolled     Past Surgical History:  Procedure Laterality Date   [place] holes in skull to alleviate pressure on the brain (no shunt)   CATARACT EXTRACTION, BILATERAL  2012   Dr. [name]   COLONOSCOPY  3/05, 11/2013   due again 2020   COLPOSCOPY N/A  05/27/2015   Procedure: COLPOSCOPY;  Surgeon: [name], MD;  Location: [place] [facility];  Service: Gynecology;  Laterality: N/A;   LEEP N/A 05/27/2015   Procedure: LOOP ELECTROSURGICAL EXCISION PROCEDURE (LEEP);  Surgeon: [name], MD;  Location: [place] [facility];  Service: Gynecology;  Laterality: N/A;   TONSILLECTOMY  age 80    There were no vitals filed for this visit.                      [facility] PT Treatment/Exercise - 01/21/21 0001       Exercises   Exercises Knee/Hip      Knee/Hip Exercises: Aerobic   Nustep L5 5 min      Knee/Hip Exercises: Seated   Long Arc Quad Limitations 3x10 @ 4#    Marching Limitations 3x10 @ 4#    Abd/Adduction Limitations 5'' hold 3x10    Sit to Sand --   4x5                   PT Education - 01/21/21 1112     Education Details Discussed extensively about the need to exercise and move frequently.  Answered PT questions.  Worked on plan to exercise at least 1x/day for 10 min.  Discussed  risks of inactivity.  pt somewhat receptive.    Person(s) Educated Patient    Methods Explanation    Comprehension Verbalized understanding              PT Short Term Goals - 01/13/21 1606       PT SHORT TERM GOAL #1   Title Pt will report understanding and adherence to HEP in order to promote independence in the management of her primary sxs.    Baseline HEP given at baseline    Time 4    Period Weeks    Status New    Target Date 02/10/21               PT Long Term Goals - 01/13/21 1609       PT LONG TERM GOAL #1   Title Pt will achieve a FOTO score of 71% in order to demonstrate improved functional ability as it relates to her generalized deconditioning.    Baseline 56%    Time 8    Period Weeks    Status New    Target Date 03/10/21      PT LONG TERM GOAL #2   Title Pt will demonstrate ability to ambulate 314f in 6 minutes in order to grocery shop with minimal limitation.    Baseline 150  ft    Time 8    Period Weeks    Status New    Target Date 03/10/21      PT LONG TERM GOAL #3   Title Pt will achieve 5xSTS in under 15 seconds in order to demonstrate improved functional balance.    Baseline 24sec    Time 8    Period Weeks    Status New    Target Date 03/10/21      PT LONG TERM GOAL #4   Title Pt will demonstrate BIL global hip MMT = 4+/5 or greater in order to progress an independent strengthening regimen without limitation.    Baseline 3+/5 global LE strength    Time 8    Period Weeks    Status New    Target Date 03/10/21                   Plan - 01/21/21 1307     Clinical Impression Statement Pt reports no increase in baseline pain following therapy  HEP was reviewed, but left unchanged    Overall, Kerri ELZAis progressing fair with therapy.  Today we concentrated on lower extremity strengthening, hip strengthening, and increasing activity tolerance.  We discussed importance of home exercise extensively and the idea that if she does not challenge herself frequently at home she will continue to decline; she confirms understanding.  She is worried she will be unable to make her co-pay.  I asked if she would like information on financial assistance and she declined.  Pt will continue to benefit from skilled physical therapy to address remaining deficits and achieve listed goals.  Continue per POC.    Personal Factors and Comorbidities Comorbidity 3+;Age    Comorbidities See medical hx    Examination-Activity Limitations Squat;Locomotion Level;Stairs;Stand;Carry;Transfers    Examination-Participation Restrictions Interpersonal Relationship;Volunteer;Community Activity;Shop;Laundry    Stability/Clinical Decision Making Stable/Uncomplicated    Rehab Potential Good    PT Frequency 1x / week    PT Duration 8 weeks    PT Treatment/Interventions ADLs/Self Care Home Management;Aquatic Therapy;Biofeedback;Cryotherapy;Electrical Stimulation;DME  Instruction;Moist Heat;Gait training;Stair training;Functional mobility training;Therapeutic activities;Therapeutic exercise;Balance training;Neuromuscular re-education;Manual techniques;Wheelchair mobility training;Patient/family education;Taping  PT Next Visit Plan Further assess balance Kerri Jansky), progress LE strengthening, aerobic conditioning    PT Home Exercise Plan [id number]    Consulted and Agree with Plan of Care Patient             Patient will benefit from skilled therapeutic intervention in order to improve the following deficits and impairments:  Abnormal gait, Cardiopulmonary status limiting activity, Decreased activity tolerance, Decreased balance, Decreased strength, Postural dysfunction, Improper body mechanics, Obesity, Decreased endurance, Difficulty walking  Visit Diagnosis: Physical deconditioning  Unsteadiness on feet  Muscle weakness (generalized)     Problem List Patient Active Problem List   Diagnosis Date Noted   Vitamin D deficiency 06/21/2018   Class 3 severe obesity with serious comorbidity and body mass index (BMI) of 40.0 to 44.9 in adult ([place]) 06/21/2018   Irregular heartbeat 08/10/2016   Acute gout 09/24/2015   Morbid obesity with body mass index of 45.0-49.9 in adult [facility]) 06/10/2014   Type 2 diabetes mellitus without complication, with long-term current use of insulin ([place]) 03/25/2014   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 ([place]) 03/25/2014   Type 2 diabetes mellitus with diabetic nephropathy, with long-term current use of insulin ([place]) 12/10/2013   Peripheral edema 12/22/2012   Diabetic retinopathy ([place]) 08/23/2012   Mixed hyperlipidemia 05/03/2011   Type II or unspecified type diabetes mellitus without mention of complication, not stated as uncontrolled 02/01/2011   Pure hypercholesterolemia 02/01/2011   Essential hypertension 02/01/2011   CKD (chronic kidney disease), stage III ([place]) 02/01/2011   Shearon Balo  PT, DPT 01/21/21 1:09 PM  [facility] [address] [place], [place], [address] Phone: [phone]   Fax:  [phone]  Name: Kerri Richardson Date of Birth: [birth year]  PHYSICAL THERAPY DISCHARGE SUMMARY  Visits from Start of Care: 2  Current functional level related to goals / functional outcomes: Unable to assess   Remaining deficits: Unable to assess   Education / Equipment: HEP   Patient agrees to discharge. Patient goals were not met. Patient is being discharged due to not returning since the last visit.  Vanessa Tickfaw, PT, DPT 02/23/21 10:30 AM

## 2021-01-28 ENCOUNTER — Ambulatory Visit: Payer: Medicare PPO

## 2021-02-04 ENCOUNTER — Ambulatory Visit: Payer: Medicare PPO

## 2021-02-11 ENCOUNTER — Ambulatory Visit: Payer: Medicare PPO | Admitting: Physical Therapy

## 2021-02-18 ENCOUNTER — Other Ambulatory Visit: Payer: Self-pay

## 2021-02-18 ENCOUNTER — Other Ambulatory Visit: Payer: Medicare PPO

## 2021-02-18 DIAGNOSIS — Z79899 Other long term (current) drug therapy: Secondary | ICD-10-CM | POA: Diagnosis not present

## 2021-02-18 DIAGNOSIS — M109 Gout, unspecified: Secondary | ICD-10-CM | POA: Diagnosis not present

## 2021-02-19 ENCOUNTER — Other Ambulatory Visit: Payer: Self-pay | Admitting: *Deleted

## 2021-02-19 DIAGNOSIS — N183 Chronic kidney disease, stage 3 unspecified: Secondary | ICD-10-CM

## 2021-02-19 LAB — BASIC METABOLIC PANEL
BUN/Creatinine Ratio: 25 (ref 12–28)
BUN: 42 mg/dL — ABNORMAL HIGH (ref 8–27)
CO2: 19 mmol/L — ABNORMAL LOW (ref 20–29)
Calcium: 9.2 mg/dL (ref 8.7–10.3)
Chloride: 102 mmol/L (ref 96–106)
Creatinine, Ser: 1.69 mg/dL — ABNORMAL HIGH (ref 0.57–1.00)
Glucose: 129 mg/dL — ABNORMAL HIGH (ref 65–99)
Potassium: 4.9 mmol/L (ref 3.5–5.2)
Sodium: 137 mmol/L (ref 134–144)
eGFR: 31 mL/min/{1.73_m2} — ABNORMAL LOW (ref >59)

## 2021-02-19 LAB — URIC ACID: Uric Acid: 4.9 mg/dL (ref 3.1–7.9)

## 2021-02-19 MED ORDER — INSULIN GLARGINE 100 UNIT/ML ~~LOC~~ SOLN: SUBCUTANEOUS | 2 refills | Status: DC

## 2021-02-25 ENCOUNTER — Encounter: Payer: Medicare PPO | Admitting: Physical Therapy

## 2021-03-03 DIAGNOSIS — H43813 Vitreous degeneration, bilateral: Secondary | ICD-10-CM | POA: Diagnosis not present

## 2021-03-03 DIAGNOSIS — Z961 Presence of intraocular lens: Secondary | ICD-10-CM | POA: Diagnosis not present

## 2021-03-03 DIAGNOSIS — H401132 Primary open-angle glaucoma, bilateral, moderate stage: Secondary | ICD-10-CM | POA: Diagnosis not present

## 2021-03-03 DIAGNOSIS — H04123 Dry eye syndrome of bilateral lacrimal glands: Secondary | ICD-10-CM | POA: Diagnosis not present

## 2021-03-03 DIAGNOSIS — E119 Type 2 diabetes mellitus without complications: Secondary | ICD-10-CM | POA: Diagnosis not present

## 2021-03-03 DIAGNOSIS — H353121 Nonexudative age-related macular degeneration, left eye, early dry stage: Secondary | ICD-10-CM | POA: Diagnosis not present

## 2021-03-03 LAB — HM DIABETES EYE EXAM

## 2021-03-05 ENCOUNTER — Encounter: Payer: Self-pay | Admitting: *Deleted

## 2021-03-11 ENCOUNTER — Other Ambulatory Visit: Payer: Self-pay | Admitting: Family Medicine

## 2021-03-11 DIAGNOSIS — E119 Type 2 diabetes mellitus without complications: Secondary | ICD-10-CM

## 2021-03-16 ENCOUNTER — Other Ambulatory Visit: Payer: Self-pay | Admitting: Family Medicine

## 2021-03-16 DIAGNOSIS — M109 Gout, unspecified: Secondary | ICD-10-CM

## 2021-03-27 ENCOUNTER — Other Ambulatory Visit: Payer: Self-pay

## 2021-03-27 ENCOUNTER — Ambulatory Visit: Payer: Medicare PPO | Admitting: Cardiology

## 2021-03-27 VITALS — BP 140/70 | HR 62 | Ht 62.0 in | Wt 282.0 lb

## 2021-03-27 DIAGNOSIS — Z794 Long term (current) use of insulin: Secondary | ICD-10-CM

## 2021-03-27 DIAGNOSIS — E782 Mixed hyperlipidemia: Secondary | ICD-10-CM

## 2021-03-27 DIAGNOSIS — E1121 Type 2 diabetes mellitus with diabetic nephropathy: Secondary | ICD-10-CM | POA: Diagnosis not present

## 2021-03-27 DIAGNOSIS — I4510 Unspecified right bundle-branch block: Secondary | ICD-10-CM | POA: Insufficient documentation

## 2021-03-27 DIAGNOSIS — R0602 Shortness of breath: Secondary | ICD-10-CM | POA: Diagnosis not present

## 2021-03-27 NOTE — Assessment & Plan Note (Signed)
Chronic, stable, no high risk symptoms such as syncope.

## 2021-03-27 NOTE — Assessment & Plan Note (Signed)
Excellent job with lipids currently on both Zetia 10 mg as well as atorvastatin 80 mg.  Denies any myalgias.  Doing very well.  Highly protective.  Continue with aspirin 81 mg as well.

## 2021-03-27 NOTE — Assessment & Plan Note (Signed)
Per primary team.  Prior hemoglobin A1c 7.3.  Medications reviewed.

## 2021-03-27 NOTE — Assessment & Plan Note (Signed)
Given her multiple comorbidities, we will go ahead and check an echocardiogram to ensure proper structure and function of her heart.  It is likely that this is a multifactorial process, deconditioning likely.  We discussed.  Continue with daily exercise to the best of her ability.

## 2021-03-27 NOTE — Progress Notes (Signed)
Cardiology Office Note:    Date:  03/27/2021   ID:  Kerri Richardson, DOB 25-Jul-1940, MRN [id number]  PCP:  Rita Ohara, MD   [facility] Providers Cardiologist:  Candee Furbish, MD     Referring MD: Rita Ohara, MD     History of Present Illness:    Kerri Richardson is a 80 y.o. female here for the evaluation of shortness of breath at the request of Dr. Tomi Bamberger.  Has hypertension, chronic kidney disease creatinine 1.8, Dr. Royce Macadamia monitoring, morbidly obese with diabetes and hyperlipidemia on atorvastatin and Zetia.  LDL from outside labs is 51.  Excellent.  TSH 2.9 ALT 14 potassium 4.9  Overall when she gets up and utilizes her walker to move around, she has had some shortness of breath moderate in severity.  Her family member Juliann Pulse here with her states the same.  Denies any chest pain or chest pressure.  No syncope no fevers chills nausea vomiting.  She smoked for about 10 years in her distant past.  I saw her previously over 3 years ago.  Past Medical History:  Diagnosis Date   Arthritis    CKD (chronic kidney disease) stage 3, GFR 30-59 ml/min (HCC)    Dr. [name]   Diabetic retinopathy    DM retinopathy ([place]) 08/17/12   early   Dyspnea    Essential hypertension, benign    Glaucoma    Gout    Pure hypercholesterolemia    Type II or unspecified type diabetes mellitus without mention of complication, not stated as uncontrolled     Past Surgical History:  Procedure Laterality Date   [place] holes in skull to alleviate pressure on the brain (no shunt)   CATARACT EXTRACTION, BILATERAL  2012   Dr. [name]   COLONOSCOPY  3/05, 11/2013   due again 2020   COLPOSCOPY N/A 05/27/2015   Procedure: COLPOSCOPY;  Surgeon: [name], MD;  Location: [facility];  Service: Gynecology;  Laterality: N/A;   LEEP N/A 05/27/2015   Procedure: LOOP ELECTROSURGICAL EXCISION PROCEDURE (LEEP);  Surgeon: [name], MD;  Location: [facility];  Service:  Gynecology;  Laterality: N/A;   TONSILLECTOMY  age 84    Current Medications: Current Meds  Medication Sig   ACCU-CHEK AVIVA PLUS test strip TEST BLOOD SUGAR TWICE DAILY   Accu-Chek Softclix Lancets lancets TEST BLOOD SUGAR TWICE DAILY   allopurinol (ZYLOPRIM) 100 MG tablet TAKE 2 TABLETS (200 MG TOTAL) BY MOUTH DAILY.   amLODipine (NORVASC) 10 MG tablet Take 1 tablet (10 mg total) by mouth daily.   aspirin 81 MG tablet Take 81 mg by mouth daily.   atorvastatin (LIPITOR) 80 MG tablet Take 1 tablet (80 mg total) by mouth daily.   Blood Glucose Monitoring Suppl (ACCU-CHEK NANO SMARTVIEW) w/Device KIT 1 each by Does not apply route 2 (two) times daily. BID   brimonidine (ALPHAGAN) 0.2 % ophthalmic solution 1 drop 2 (two) times daily.   Cholecalciferol (VITAMIN D) 50 MCG (2000 UT) CAPS Take 1 capsule by mouth daily.   dorzolamide-timolol (COSOPT) 22.3-6.8 MG/ML ophthalmic solution 1 drop 2 (two) times daily.   DROPLET INSULIN SYRINGE 31G X 5/16" 0.5 ML MISC USE EVERY DAY   enalapril (VASOTEC) 10 MG tablet TAKE 1 TABLET EVERY DAY   ezetimibe (ZETIA) 10 MG tablet TAKE 1 TABLET EVERY DAY   furosemide (LASIX) 20 MG tablet Take 20 mg by mouth daily.   insulin glargine (  LANTUS) 100 UNIT/ML injection INJECT 20 UNITS EVERY NIGHT AT BEDTIME (DISCARD OPEN VIAL 28 DAYS AFTER FIRST OPEN)   latanoprost (XALATAN) 0.005 % ophthalmic solution Place 1 drop into both eyes at bedtime.   Magnesium Oxide 400 MG CAPS Take 2 capsules by mouth daily.   metoprolol tartrate (LOPRESSOR) 50 MG tablet Take 50 mg by mouth 2 (two) times daily.   pioglitazone (ACTOS) 30 MG tablet TAKE 1 TABLET EVERY DAY     Allergies:   Patient has no known allergies.   Social History   Socioeconomic History   Marital status: Single    Spouse name: Not on file   Number of children: Not on file   Years of education: Not on file   Highest education level: Not on file  Occupational History   Occupation: Retired  Tobacco Use    Smoking status: Former    Types: Cigarettes    Quit date: 06/14/2000    Years since quitting: 20.7   Smokeless tobacco: Never  Vaping Use   Vaping Use: Never used  Substance and Sexual Activity   Alcohol use: No    Alcohol/week: 0.0 standard drinks   Drug use: No   Sexual activity: Never    Partners: Male    Birth control/protection: Post-menopausal  Other Topics Concern   Not on file  Social History Narrative   Worked at [employer], retired 12/2013. Lives alone.  She has a sister in [place] (no longer speaking to each other)  No children. Her God-son lives in [place].   Social Determinants of Health   Financial Resource Strain: Not on file  Food Insecurity: Not on file  Transportation Needs: Not on file  Physical Activity: Not on file  Stress: Not on file  Social Connections: Not on file     Family History: The patient's family history includes Breast cancer (age of onset: 65) in her maternal aunt; Cancer in her mother; Diabetes in her father, paternal grandmother, and sister; Heart disease in her sister; Heart disease (age of onset: 13) in her sister; Hypertension in her father, mother, sister, and sister; Stroke in her father.  ROS:   Please see the history of present illness.    No fevers chills nausea vomiting syncope all other systems reviewed and are negative.  EKGs/Labs/Other Studies Reviewed:    The following studies were reviewed today: Prior notes, EKG reviewed  EKG: 01/06/2021-sinus rhythm right bundle branch block no other abnormalities.    Recent Labs: 07/07/2020: ALT 14; Hemoglobin 13.5; Platelets 199; TSH 2.900 01/05/2021: Magnesium 2.2 02/18/2021: BUN 42; Creatinine, Ser 1.69; Potassium 4.9; Sodium 137  Recent Lipid Panel    Component Value Date/Time   CHOL 108 07/07/2020 1556   TRIG 94 07/07/2020 1556   HDL 39 (L) 07/07/2020 1556   CHOLHDL 2.8 07/07/2020 1556   CHOLHDL 4.4 05/16/2017 1040   VLDL 24 09/27/2016 1047   LDLCALC 51 07/07/2020 1556   LDLCALC  122 (H) 05/16/2017 1040     Risk Assessment/Calculations:          Physical Exam:    VS:  BP 140/70 (BP Location: Left Arm, Patient Position: Sitting, Cuff Size: Normal)   Pulse 62   Ht 5' 2" (1.575 m)   Wt 282 lb (127.9 kg)   LMP 06/14/1998 (Approximate)   SpO2 98%   BMI 51.58 kg/m     Wt Readings from Last 3 Encounters:  03/27/21 282 lb (127.9 kg)  01/05/21 279 lb (126.6 kg)  07/07/20 284  lb 9.6 oz (129.1 kg)     GEN: Utilizes walker overweight well nourished, well developed in no acute distress HEENT: Normal NECK: No JVD; No carotid bruits LYMPHATICS: No lymphadenopathy CARDIAC: RRR, no murmurs, rubs, gallops RESPIRATORY:  Clear to auscultation without rales, wheezing or rhonchi  ABDOMEN: Soft, non-tender, non-distended MUSCULOSKELETAL: Trace lower extremity edema; No deformity  SKIN: Warm and dry NEUROLOGIC:  Alert and oriented x 3 PSYCHIATRIC:  Normal affect   ASSESSMENT:    1. Shortness of breath   2. Right bundle branch block   3. Mixed hyperlipidemia   4. Type 2 diabetes mellitus with diabetic nephropathy, with long-term current use of insulin (HCC)    PLAN:    In order of problems listed above:  Shortness of breath Given her multiple comorbidities, we will go ahead and check an echocardiogram to ensure proper structure and function of her heart.  It is likely that this is a multifactorial process, deconditioning likely.  We discussed.  Continue with daily exercise to the best of her ability.  Right bundle branch block Chronic, stable, no high risk symptoms such as syncope.  Mixed hyperlipidemia Excellent job with lipids currently on both Zetia 10 mg as well as atorvastatin 80 mg.  Denies any myalgias.  Doing very well.  Highly protective.  Continue with aspirin 81 mg as well.  Type 2 diabetes mellitus with diabetic nephropathy, with long-term current use of insulin ([place]) Per primary team.  Prior hemoglobin A1c 7.3.  Medications reviewed.   We will  follow-up with results of testing.  Please let us know if we can be of further assistance.     Medication Adjustments/Labs and Tests Ordered: Current medicines are reviewed at length with the patient today.  Concerns regarding medicines are outlined above.  Orders Placed This Encounter  Procedures   ECHOCARDIOGRAM COMPLETE   No orders of the defined types were placed in this encounter.   Patient Instructions  Medication Instructions:  The current medical regimen is effective;  continue present plan and medications.  *If you need a refill on your cardiac medications before your next appointment, please call your pharmacy*  Testing/Procedures: Your physician has requested that you have an echocardiogram. Echocardiography is a painless test that uses sound waves to create images of your heart. It provides your doctor with information about the size and shape of your heart and how well your heart's chambers and valves are working. This procedure takes approximately one hour. There are no restrictions for this procedure.  Follow-Up: At [facility], you and your health needs are our priority.  As part of our continuing mission to provide you with exceptional heart care, we have created designated Provider Care Teams.  These Care Teams include your primary Cardiologist (physician) and Advanced Practice Providers (APPs -  Physician Assistants and Nurse Practitioners) who all work together to provide you with the care you need, when you need it.  We recommend signing up for the patient portal called "MyChart".  Sign up information is provided on this After Visit Summary.  MyChart is used to connect with patients for Virtual Visits (Telemedicine).  Patients are able to view lab/test results, encounter notes, upcoming appointments, etc.  Non-urgent messages can be sent to your provider as well.   To learn more about what you can do with MyChart, go to [email or web address].    Your next  appointment:   Follow up as needed with Dr Marlou Porch.  Thank you for choosing [facility]!!  Signed, Candee Furbish, MD  03/27/2021 10:14 AM    [facility]

## 2021-03-27 NOTE — Patient Instructions (Signed)
Medication Instructions:  ?The current medical regimen is effective;  continue present plan and medications. ? ?*If you need a refill on your cardiac medications before your next appointment, please call your pharmacy* ? ?Testing/Procedures: ?Your physician has requested that you have an echocardiogram. Echocardiography is a painless test that uses sound waves to create images of your heart. It provides your doctor with information about the size and shape of your heart and how well your heart?s chambers and valves are working. This procedure takes approximately one hour. There are no restrictions for this procedure. ? ?Follow-Up: ?At [facility], you and your health needs are our priority.  As part of our continuing mission to provide you with exceptional heart care, we have created designated Provider Care Teams.  These Care Teams include your primary Cardiologist (physician) and Advanced Practice Providers (APPs -  Physician Assistants and Nurse Practitioners) who all work together to provide you with the care you need, when you need it. ? ?We recommend signing up for the patient portal called "MyChart".  Sign up information is provided on this After Visit Summary.  MyChart is used to connect with patients for Virtual Visits (Telemedicine).  Patients are able to view lab/test results, encounter notes, upcoming appointments, etc.  Non-urgent messages can be sent to your provider as well.   ?To learn more about what you can do with MyChart, go to [email or web address].   ? ?Your next appointment:   ?Follow up as needed with Dr Skains. ? ?Thank you for choosing [facility]!! ? ? ? ?

## 2021-03-31 DIAGNOSIS — K5900 Constipation, unspecified: Secondary | ICD-10-CM | POA: Diagnosis not present

## 2021-03-31 DIAGNOSIS — K219 Gastro-esophageal reflux disease without esophagitis: Secondary | ICD-10-CM | POA: Diagnosis not present

## 2021-03-31 DIAGNOSIS — R1310 Dysphagia, unspecified: Secondary | ICD-10-CM | POA: Diagnosis not present

## 2021-04-01 ENCOUNTER — Other Ambulatory Visit: Payer: Self-pay | Admitting: Gastroenterology

## 2021-04-01 DIAGNOSIS — R1310 Dysphagia, unspecified: Secondary | ICD-10-CM

## 2021-04-06 ENCOUNTER — Ambulatory Visit
Admission: RE | Admit: 2021-04-06 | Discharge: 2021-04-06 | Disposition: A | Discharge status: Home / Self Care | Payer: Medicare PPO | Source: Ambulatory Visit | Attending: Gastroenterology | Admitting: Gastroenterology

## 2021-04-06 ENCOUNTER — Other Ambulatory Visit: Payer: Self-pay | Admitting: Gastroenterology

## 2021-04-06 DIAGNOSIS — R1310 Dysphagia, unspecified: Secondary | ICD-10-CM | POA: Diagnosis not present

## 2021-04-06 DIAGNOSIS — K224 Dyskinesia of esophagus: Secondary | ICD-10-CM | POA: Diagnosis not present

## 2021-04-13 DIAGNOSIS — K224 Dyskinesia of esophagus: Secondary | ICD-10-CM | POA: Insufficient documentation

## 2021-04-15 ENCOUNTER — Ambulatory Visit (HOSPITAL_COMMUNITY): Payer: Medicare PPO | Attending: Cardiology

## 2021-04-15 ENCOUNTER — Other Ambulatory Visit: Payer: Self-pay

## 2021-04-15 DIAGNOSIS — R0602 Shortness of breath: Secondary | ICD-10-CM | POA: Insufficient documentation

## 2021-04-15 LAB — ECHOCARDIOGRAM COMPLETE
Area-P 1/2: 4.39 cm2
MV M vel: 4.6 m/s
MV Peak grad: 84.6 mmHg
S' Lateral: 2.9 cm

## 2021-04-20 ENCOUNTER — Other Ambulatory Visit: Payer: Self-pay | Admitting: Family Medicine

## 2021-04-20 DIAGNOSIS — E782 Mixed hyperlipidemia: Secondary | ICD-10-CM

## 2021-04-20 DIAGNOSIS — E1122 Type 2 diabetes mellitus with diabetic chronic kidney disease: Secondary | ICD-10-CM

## 2021-05-06 ENCOUNTER — Other Ambulatory Visit: Payer: Self-pay | Admitting: Gastroenterology

## 2021-05-13 ENCOUNTER — Other Ambulatory Visit: Payer: Self-pay | Admitting: Family Medicine

## 2021-05-14 NOTE — Anesthesia Preprocedure Evaluation (Addendum)
Anesthesia Evaluation  Patient identified by MRN, date of birth, ID band Patient awake    Reviewed: Allergy & Precautions, NPO status , Patient's Chart, lab work & pertinent test results  Airway Mallampati: I       Dental  (+) Edentulous Lower, Missing, Poor Dentition,    Pulmonary former smoker,    Pulmonary exam normal        Cardiovascular hypertension, Pt. on medications and Pt. on home beta blockers Normal cardiovascular exam     Neuro/Psych    GI/Hepatic GERD  Medicated,  Endo/Other  diabetes, Type 2, Insulin Dependent, Oral Hypoglycemic Agents  Renal/GU      Musculoskeletal   Abdominal (+) + obese,   Peds  Hematology   Anesthesia Other Findings Left ventricular ejection fraction by 3D volume is 72 %. The left ventricle has normal function. The left ventricle has no regional wall motion abnormalities. Left ventricular diastolic parameters are consistent with Grade II diastolic dysfunction (pseudonormalization). The average left ventricular global longitudinal strain is -24.5 %. The global longitudinal strain is normal. 1. Right ventricular systolic function is normal. The right ventricular size is mildly enlarged. 2. The mitral valve is normal in structure. Mild mitral valve regurgitation. No evidence of mitral stenosis. 3. The aortic valve is tricuspid. There is mild calcification of the aortic valve. There is mild thickening of the aortic valve. Aortic valve regurgitation is not visualized. Mild aortic valve sclerosis is present, with no evidence of aortic valve stenosis. 4. The inferior vena cava is normal in size with greater than 50% respiratory variability, suggesting right atrial pressure of 3 mmHg. 5. FINDINGS Left Ventricle:  Reproductive/Obstetrics                            Anesthesia Physical Anesthesia Plan  ASA: 3  Anesthesia Plan: MAC   Post-op Pain  Management:    Induction: Intravenous  PONV Risk Score and Plan: Propofol infusion  Airway Management Planned: Natural Airway and Mask  Additional Equipment: None  Intra-op Plan:   Post-operative Plan:   Informed Consent: I have reviewed the patients History and Physical, chart, labs and discussed the procedure including the risks, benefits and alternatives for the proposed anesthesia with the patient or authorized representative who has indicated his/her understanding and acceptance.     Dental advisory given  Plan Discussed with: CRNA  Anesthesia Plan Comments:        Anesthesia Quick Evaluation

## 2021-05-15 ENCOUNTER — Ambulatory Visit (HOSPITAL_COMMUNITY)
Admission: RE | Admit: 2021-05-15 | Discharge: 2021-05-15 | Disposition: A | Discharge status: Home / Self Care | Payer: Medicare PPO | Attending: Gastroenterology | Admitting: Gastroenterology

## 2021-05-15 ENCOUNTER — Ambulatory Visit (HOSPITAL_COMMUNITY): Payer: Medicare PPO | Admitting: Anesthesiology

## 2021-05-15 ENCOUNTER — Encounter (HOSPITAL_COMMUNITY)
Admission: RE | Disposition: A | Discharge status: Home / Self Care | Payer: Self-pay | Source: Home / Self Care | Attending: Gastroenterology

## 2021-05-15 ENCOUNTER — Other Ambulatory Visit: Payer: Self-pay

## 2021-05-15 ENCOUNTER — Encounter (HOSPITAL_COMMUNITY): Payer: Self-pay | Admitting: Gastroenterology

## 2021-05-15 DIAGNOSIS — E559 Vitamin D deficiency, unspecified: Secondary | ICD-10-CM | POA: Diagnosis not present

## 2021-05-15 DIAGNOSIS — N183 Chronic kidney disease, stage 3 unspecified: Secondary | ICD-10-CM | POA: Insufficient documentation

## 2021-05-15 DIAGNOSIS — E669 Obesity, unspecified: Secondary | ICD-10-CM | POA: Diagnosis not present

## 2021-05-15 DIAGNOSIS — K449 Diaphragmatic hernia without obstruction or gangrene: Secondary | ICD-10-CM | POA: Insufficient documentation

## 2021-05-15 DIAGNOSIS — K2289 Other specified disease of esophagus: Secondary | ICD-10-CM | POA: Insufficient documentation

## 2021-05-15 DIAGNOSIS — E1122 Type 2 diabetes mellitus with diabetic chronic kidney disease: Secondary | ICD-10-CM | POA: Insufficient documentation

## 2021-05-15 DIAGNOSIS — R1310 Dysphagia, unspecified: Secondary | ICD-10-CM | POA: Insufficient documentation

## 2021-05-15 DIAGNOSIS — Z7984 Long term (current) use of oral hypoglycemic drugs: Secondary | ICD-10-CM | POA: Diagnosis not present

## 2021-05-15 DIAGNOSIS — Z87891 Personal history of nicotine dependence: Secondary | ICD-10-CM | POA: Diagnosis not present

## 2021-05-15 DIAGNOSIS — I129 Hypertensive chronic kidney disease with stage 1 through stage 4 chronic kidney disease, or unspecified chronic kidney disease: Secondary | ICD-10-CM | POA: Diagnosis not present

## 2021-05-15 DIAGNOSIS — Z6841 Body Mass Index (BMI) 40.0 and over, adult: Secondary | ICD-10-CM | POA: Diagnosis not present

## 2021-05-15 DIAGNOSIS — Z794 Long term (current) use of insulin: Secondary | ICD-10-CM | POA: Insufficient documentation

## 2021-05-15 DIAGNOSIS — K222 Esophageal obstruction: Secondary | ICD-10-CM | POA: Diagnosis not present

## 2021-05-15 DIAGNOSIS — I4510 Unspecified right bundle-branch block: Secondary | ICD-10-CM | POA: Diagnosis not present

## 2021-05-15 DIAGNOSIS — E7800 Pure hypercholesterolemia, unspecified: Secondary | ICD-10-CM | POA: Diagnosis not present

## 2021-05-15 HISTORY — PX: ESOPHAGOGASTRODUODENOSCOPY (EGD) WITH PROPOFOL: SHX5813

## 2021-05-15 HISTORY — PX: SAVORY DILATION: SHX5439

## 2021-05-15 LAB — GLUCOSE, CAPILLARY: Glucose-Capillary: 106 mg/dL — ABNORMAL HIGH (ref 70–99)

## 2021-05-15 SURGERY — ESOPHAGOGASTRODUODENOSCOPY (EGD) WITH PROPOFOL
Anesthesia: Monitor Anesthesia Care

## 2021-05-15 MED ORDER — PHENYLEPHRINE HCL (PRESSORS) 10 MG/ML IV SOLN
INTRAVENOUS | Status: AC
  Filled 2021-05-15: qty 1

## 2021-05-15 MED ORDER — SODIUM CHLORIDE 0.9 % IV SOLN: INTRAVENOUS | Status: DC

## 2021-05-15 MED ORDER — LACTATED RINGERS IV SOLN: INTRAVENOUS | Status: DC | PRN

## 2021-05-15 MED ORDER — LIDOCAINE 2% (20 MG/ML) 5 ML SYRINGE
INTRAMUSCULAR | Status: DC | PRN
  Administered 2021-05-15: 40 mg via INTRAVENOUS
  Administered 2021-05-15: 20 mg via INTRAVENOUS

## 2021-05-15 MED ORDER — LACTATED RINGERS IV SOLN
INTRAVENOUS | Status: AC | PRN
  Administered 2021-05-15: 20 mL/h via INTRAVENOUS

## 2021-05-15 MED ORDER — PROPOFOL 10 MG/ML IV BOLUS
INTRAVENOUS | Status: DC | PRN
  Administered 2021-05-15: 50 mg via INTRAVENOUS
  Administered 2021-05-15 (×2): 20 mg via INTRAVENOUS

## 2021-05-15 SURGICAL SUPPLY — 14 items

## 2021-05-15 NOTE — Discharge Instructions (Signed)

## 2021-05-15 NOTE — Transfer of Care (Signed)
Immediate Anesthesia Transfer of Care Note  Patient: Kerri Richardson  Procedure(s) Performed: ESOPHAGOGASTRODUODENOSCOPY (EGD) WITH PROPOFOL SAVORY DILATION  Patient Location: PACU and Endoscopy Unit  Anesthesia Type:MAC  Level of Consciousness: awake and alert   Airway & Oxygen Therapy: Patient Spontanous Breathing and Patient connected to face mask oxygen  Post-op Assessment: Report given to RN and Post -op Vital signs reviewed and stable  Post vital signs: Reviewed and stable  Last Vitals:  Vitals Value Taken Time  BP 157/67 05/15/21 0803  Temp    Pulse 67 05/15/21 0804  Resp 16 05/15/21 0804  SpO2 95 % 05/15/21 0804  Vitals shown include unvalidated device data.  Last Pain:  Vitals:   05/15/21 0803  TempSrc:   PainSc: 0-No pain         Complications: No notable events documented.

## 2021-05-15 NOTE — Addendum Note (Signed)
Addendum  created 05/15/21 0838 by Minerva Ends, CRNA   Flowsheet accepted, Intraprocedure Flowsheets edited

## 2021-05-15 NOTE — Anesthesia Postprocedure Evaluation (Signed)
Anesthesia Post Note  Patient: Kerri Richardson  Procedure(s) Performed: ESOPHAGOGASTRODUODENOSCOPY (EGD) WITH PROPOFOL SAVORY DILATION     Patient location during evaluation: Endoscopy Anesthesia Type: MAC Level of consciousness: awake and sedated Pain management: pain level controlled Vital Signs Assessment: post-procedure vital signs reviewed and stable Respiratory status: spontaneous breathing Cardiovascular status: stable Postop Assessment: no apparent nausea or vomiting Anesthetic complications: no   No notable events documented.  Last Vitals:  Vitals:   05/15/21 0811 05/15/21 0821  BP: (!) 165/80 (!) 187/80  Pulse: (!) 59 60  Resp: 16 17  Temp:    SpO2: 100% 96%    Last Pain:  Vitals:   05/15/21 0821  TempSrc:   PainSc: 0-No pain   Pain Goal:                   Huston Foley

## 2021-05-15 NOTE — Op Note (Signed)
[facility] Patient Name: Kerri Richardson Procedure Date: 05/15/2021 MRN: [id number] Attending MD: Jeani Hawking , MD Date of Birth: 03-13-1941 CSN: [id number] Age: 80 Admit Type: Outpatient Procedure:                Upper GI endoscopy Indications:              Dysphagia Providers:                Jeani Hawking, MD, Vicki Mallet, RN, [facility]                            Technician, Technician Referring MD:              Medicines:                Propofol per Anesthesia Complications:            No immediate complications. Estimated Blood Loss:     Estimated blood loss: none. Procedure:                Pre-Anesthesia Assessment:                           - Prior to the procedure, a History and Physical                            was performed, and patient medications and                            allergies were reviewed. The patient's tolerance of                            previous anesthesia was also reviewed. The risks                            and benefits of the procedure and the sedation                            options and risks were discussed with the patient.                            All questions were answered, and informed consent                            was obtained. Prior Anticoagulants: The patient has                            taken no previous anticoagulant or antiplatelet                            agents. ASA Grade Assessment: III - A patient with                            severe systemic disease. After reviewing the risks                            and  benefits, the patient was deemed in                            satisfactory condition to undergo the procedure.                           - Sedation was administered by an anesthesia                            professional. Deep sedation was attained.                           After obtaining informed consent, the endoscope was                            passed under direct vision. Throughout the                             procedure, the patient's blood pressure, pulse, and                            oxygen saturations were monitored continuously. The                            [id number]-H190 ([id number]) Olympus endoscope was introduced                            through the mouth, and advanced to the second part                            of duodenum. The upper GI endoscopy was somewhat                            difficult due to the patient's oxygen desaturation.                            Successful completion of the procedure was aided by                            performing chin lift and treating with ventilation.                            The patient tolerated the procedure well. Scope In: Scope Out: Findings:      One benign-appearing, intrinsic mild stenosis was found at the       gastroesophageal junction. This stenosis measured 1.7 cm (inner       diameter) x less than one cm (in length). The stenosis was traversed. A       guidewire was placed and the scope was withdrawn. Dilation was performed       with a Savary dilator with no resistance at 18 mm. The dilation site was       examined following endoscope reinsertion and showed no change. Estimated       blood loss: none.      A 3 cm hiatal  hernia was present.      The gastroesophageal flap valve was visualized endoscopically and       classified as Hill Grade IV (no fold, wide open lumen, hiatal hernia       present).      The stomach was normal.      The examined duodenum was normal. Impression:               - Benign-appearing esophageal stenosis. Dilated.                           - 3 cm hiatal hernia.                           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                           - Normal stomach.                           - Normal examined duodenum.                           - No  specimens collected. Moderate Sedation:      Not Applicable - Patient had care per Anesthesia. Recommendation:           - Patient has a contact number available for                            emergencies. The signs and symptoms of potential                            delayed complications were discussed with the                            patient. Return to normal activities tomorrow.                            Written discharge instructions were provided to the                            patient.                           - Resume previous diet.                           - Continue present medications.                           - Follow up with Dr. Loreta Ave in 4 weeks. Procedure Code(s):        --- Professional ---  00762, Esophagogastroduodenoscopy, flexible,                            transoral; with insertion of guide wire followed by                            passage of dilator(s) through esophagus over guide                            wire Diagnosis Code(s):        --- Professional ---                           K22.2, Esophageal obstruction                           K44.9, Diaphragmatic hernia without obstruction or                            gangrene                           R13.10, Dysphagia, unspecified CPT copyright 2019 [employer] Medical Association. All rights reserved. The codes documented in this report are preliminary and upon coder review may  be revised to meet current compliance requirements. Jeani Hawking, MD Jeani Hawking, MD 05/15/2021 7:48:02 AM This report has been signed electronically. Number of Addenda: 0

## 2021-05-15 NOTE — Anesthesia Procedure Notes (Signed)
Procedure Name: MAC Date/Time: 05/15/2021 7:24 AM Performed by: Cynda Familia, CRNA Pre-anesthesia Checklist: Patient identified, Emergency Drugs available, Suction available, Patient being monitored and Timeout performed Patient Re-evaluated:Patient Re-evaluated prior to induction Oxygen Delivery Method: Simple face mask Placement Confirmation: positive ETCO2 and breath sounds checked- equal and bilateral Dental Injury: Teeth and Oropharynx as per pre-operative assessment

## 2021-05-15 NOTE — H&P (Signed)
Kerri Richardson HPI: This 80 year old black female presents to the office for further evaluation of  swallowing difficulties which started in June, 2022. She has problems swallowing both liquids and solids. She denies NSAID use or abuse with exception of an 81 mg Aspirin. Her PCP gave her a trial of Pepcid AC which is given her some relief. She has 1 BM every other day with no obvious blood or mucus in the stool. She has a good appetite and her weight has been stable. She denies having any complaints of abdominal pain, nausea, vomiting, acid reflux or odynophagia. She denies having a family history of colon cancer, celiac sprue or IBD. Her last colonoscopy was done on 11/26/2013 when a tubular adenoma was removed from the ascending colon.   Past Medical History:  Diagnosis Date   Arthritis    CKD (chronic kidney disease) stage 3, GFR 30-59 ml/min (HCC)    Dr. Lowell Guitar   Diabetic retinopathy    DM retinopathy (HCC) 08/17/12   early   Dyspnea    Essential hypertension, benign    Glaucoma    Gout    Pure hypercholesterolemia    Type II or unspecified type diabetes mellitus without mention of complication, not stated as uncontrolled     Past Surgical History:  Procedure Laterality Date   BRAIN SURGERY  1969   Burr holes in skull to alleviate pressure on the brain (no shunt)   CATARACT EXTRACTION, BILATERAL  2012   Dr. Dione Booze   COLONOSCOPY  3/05, 11/2013   due again 2020   COLPOSCOPY N/A 05/27/2015   Procedure: COLPOSCOPY;  Surgeon: Patton Salles, MD;  Location: [facility];  Service: Gynecology;  Laterality: N/A;   LEEP N/A 05/27/2015   Procedure: LOOP ELECTROSURGICAL EXCISION PROCEDURE (LEEP);  Surgeon: Patton Salles, MD;  Location: [facility];  Service: Gynecology;  Laterality: N/A;   TONSILLECTOMY  age 35    Family History  Problem Relation Age of Onset   Hypertension Mother    Cancer Mother        lung (nonsmoker)   Stroke Father    Hypertension Father    Diabetes  Father    Hypertension Sister    Heart disease Sister 82       stent   Diabetes Sister    Heart disease Sister    Hypertension Sister    Breast cancer Maternal Aunt 37       dec of old age   Diabetes Paternal Grandmother     Social History:  reports that she quit smoking about 20 years ago. Her smoking use included cigarettes. She has never used smokeless tobacco. She reports that she does not drink alcohol and does not use drugs.  Allergies: No Known Allergies  Medications: Scheduled: Continuous:  sodium chloride     lactated ringers 20 mL/hr (05/15/21 0710)    Results for orders placed or performed during the hospital encounter of 05/15/21 (from the past 24 hour(s))  Glucose, capillary     Status: Abnormal   Collection Time: 05/15/21  7:09 AM  Result Value Ref Range   Glucose-Capillary 106 (H) 70 - 99 mg/dL     No results found.  ROS:  As stated above in the HPI otherwise negative.  Blood pressure (!) 187/67, pulse 60, temperature 98.7 F (37.1 C), temperature source Oral, resp. rate 15, height 5\' 2"  (1.575 m), weight 127 kg, last menstrual period 06/14/1998, SpO2 99 %.  PE: Gen: NAD, Alert and Oriented HEENT:  [place]/AT, EOMI Neck: Supple, no LAD Lungs: CTA Bilaterally CV: RRR without M/G/R ABD: Soft, NTND, +BS, morbidly obese Ext: No C/C/E  Assessment/Plan: 1) Dysphagia - EGD with dilation.  Aviel Davalos D 05/15/2021, 7:13 AM

## 2021-05-18 ENCOUNTER — Encounter (HOSPITAL_COMMUNITY): Payer: Self-pay | Admitting: Gastroenterology

## 2021-05-27 ENCOUNTER — Other Ambulatory Visit: Payer: Self-pay | Admitting: Family Medicine

## 2021-05-27 DIAGNOSIS — E1169 Type 2 diabetes mellitus with other specified complication: Secondary | ICD-10-CM

## 2021-06-16 DIAGNOSIS — K219 Gastro-esophageal reflux disease without esophagitis: Secondary | ICD-10-CM | POA: Diagnosis not present

## 2021-06-16 DIAGNOSIS — K449 Diaphragmatic hernia without obstruction or gangrene: Secondary | ICD-10-CM | POA: Diagnosis not present

## 2021-06-16 DIAGNOSIS — K5900 Constipation, unspecified: Secondary | ICD-10-CM | POA: Diagnosis not present

## 2021-06-30 DIAGNOSIS — N1832 Chronic kidney disease, stage 3b: Secondary | ICD-10-CM | POA: Diagnosis not present

## 2021-07-07 DIAGNOSIS — E875 Hyperkalemia: Secondary | ICD-10-CM | POA: Diagnosis not present

## 2021-07-07 DIAGNOSIS — N1832 Chronic kidney disease, stage 3b: Secondary | ICD-10-CM | POA: Diagnosis not present

## 2021-07-07 DIAGNOSIS — E1122 Type 2 diabetes mellitus with diabetic chronic kidney disease: Secondary | ICD-10-CM | POA: Diagnosis not present

## 2021-07-07 DIAGNOSIS — E872 Acidosis, unspecified: Secondary | ICD-10-CM | POA: Diagnosis not present

## 2021-07-07 DIAGNOSIS — N2581 Secondary hyperparathyroidism of renal origin: Secondary | ICD-10-CM | POA: Diagnosis not present

## 2021-07-07 DIAGNOSIS — I129 Hypertensive chronic kidney disease with stage 1 through stage 4 chronic kidney disease, or unspecified chronic kidney disease: Secondary | ICD-10-CM | POA: Diagnosis not present

## 2021-07-12 NOTE — Patient Instructions (Addendum)
HEALTH MAINTENANCE RECOMMENDATIONS:  It is recommended that you get at least 30 minutes of aerobic exercise at least 5 days/week (for weight loss, you may need as much as 60-90 minutes). This can be any activity that gets your heart rate up. This can be divided in 10-15 minute intervals if needed, but try and build up your endurance at least once a week.  Weight bearing exercise is also recommended twice weekly.  Eat a healthy diet with lots of vegetables, fruits and fiber.  "Colorful" foods have a lot of vitamins (ie green vegetables, tomatoes, red peppers, etc).  Limit sweet tea, regular sodas and alcoholic beverages, all of which has a lot of calories and sugar.  Up to 1 alcoholic drink daily may be beneficial for women (unless trying to lose weight, watch sugars).  Drink a lot of water.  Calcium recommendations are 1200-1500 mg daily (1500 mg for postmenopausal women or women without ovaries), and vitamin D 1000 IU daily.  This should be obtained from diet and/or supplements (vitamins), and calcium should not be taken all at once, but in divided doses.  Monthly self breast exams and yearly mammograms for women over the age of 8 is recommended.  Sunscreen of at least SPF 30 should be used on all sun-exposed parts of the skin when outside between the hours of 10 am and 4 pm (not just when at beach or pool, but even with exercise, golf, tennis, and yard work!)  Use a sunscreen that says "broad spectrum" so it covers both UVA and UVB rays, and make sure to reapply every 1-2 hours.  Remember to change the batteries in your smoke detectors when changing your clock times in the spring and fall. Carbon monoxide detectors are recommended for your home.  Use your seat belt every time you are in a car, and please drive safely and not be distracted with cell phones and texting while driving.   Kerri Richardson , Thank you for taking time to come for your Medicare Wellness Visit. I appreciate your ongoing  commitment to your health goals. Please review the following plan we discussed and let me know if I can assist you in the future.   This is a list of the screening recommended for you and due dates:  Health Maintenance  Topic Date Due   Zoster (Shingles) Vaccine (1 of 2) Never done   Flu Shot  01/12/2021   COVID-19 Vaccine (5 - Booster for Pfizer series) 03/02/2021   Complete foot exam   07/07/2021   Hemoglobin A1C  07/08/2021   Eye exam for diabetics  03/03/2022   Tetanus Vaccine  06/12/2028   Pneumonia Vaccine  Completed   DEXA scan (bone density measurement)  Completed   Hepatitis C Screening: USPSTF Recommendation to screen - Ages 46-79 yo.  Completed   HPV Vaccine  Aged Out   Colon Cancer Screening  Discontinued   I recommend getting the new shingles vaccine (Shingrix). Since you have Medicare, you will need to get this from the pharmacy, as it is covered by Part D. This is a series of 2 injections, spaced 2 months apart.   This should be separated from other vaccines by at least 2 weeks. There is no longer any additional charge/copay for this vaccine (this is new, as of 06/14/2021!)  Please check with Dr. Kenna Gilbert office as to whether any further colonoscopies are needed (you last had one in 2015, with polyp).  Congratulations on the 10 pounds lost so far!  Your sugars are much better.  I'm concerned that you may have your sugars drop too low, and that you may not feel it (due to being on  a beta blocker for your blood pressure).   Please decrease your Lantus dose to 18 U (decrease 2 units). If your morning sugars are dropping below 75 pretty regularly, then we may need to decrease the dose further, especially if you continue to lose weight.  If you morning sugars go up over 140 regularly, then bump the dose back up to 20 units.

## 2021-07-12 NOTE — Progress Notes (Signed)
Chief Complaint  Patient presents with   Medicare Wellness    Fasting AWV, patient does want to get on table or have pelvic exam done today. Patient had HD flu, Pfizer bivalent and Prevnar 20 done at [facility] 10/22, did not have Shingrix. No new concerns today.    Kerri Richardson is a 81 y.o. female who presents for annual physical exam, Medicare wellness visit and follow-up on chronic medical conditions.  She last saw Dr. Quincy Simmonds for Well Woman exam in 07/2018.  Patient had CPE 06/2020, but refused to get onto exam table, pelvic exam not performed. She again refuses pelvic, won't attempt getting on exam table today.  In July she had been complaining of generalized weakness, trouble walking, asking for handicap placard. She was getting out of breath easily with walking.  She wasn't really having much joint pain. She was referred to PT to help, but she only had 2 visits in PT, in 01/2021.  She couldn't afford the copays.  She did the HEP for a while, but not recently.    In August she saw Dr. Marlou Porch for evaluation of shortness of breath.  Echo was performed 04/2021, showing mild MR, normal EF.  Dysphagia was reported at her last visit in July.  She saw Dr. Collene Mares in October.  Ba swallow showed stricture and dysmotility.  Labs done at that time showed normal CBC, LFT's.  TSH was elevated at 5.530.  Prior TSH with [name] was normal; to be rechecked today. She was started on omeprazole 48m daily.  She underwent EGD in 05/2021 with Dr. HBenson Norway She was found to have benign-appearing esophageal stenosis, which was dilated. She also had a 3 cm hiatal hernia. She continues to take PPI daily.  Only rarely feels like food gets caught in her chest. She denies nausea, heartburn. She has h/o colon polyps, this was noted in consult with Dr. MCollene Mares   No recommendation for f/u colonoscopy was made (pt states they didn't discuss).  Diabetes follow-up: She is currently taking 20 Units of Lantus insulin, and 344mof  pioglitazone. Sugars are running 80-93 in the mornings.  Doesn't check other times of day. She denies hypoglycemia, polydipsia or polyuria.  She has urinary frequency at night (every 2 hours), unchanged, not as often during the day. Diabetic eye exam was 02/2021, no retinopathy. Patient follows a low sugar diet and checks feet regularly without concerns.   She has lost 10# since her last visit here, eating better. Lab Results  Component Value Date   HGBA1C 7.3 (A) 01/05/2021    Hyperlipidemia follow-up: Patient is reportedly following a low-fat, low cholesterol diet. She is compliant with medications (atorvastatin and zetia) and denies medication side effects.  Lipids were at goal on this regimen last year, due for recheck.  Lab Results  Component Value Date   CHOL 108 07/07/2020   HDL 39 (L) 07/07/2020   LDLCALC 51 07/07/2020   TRIG 94 07/07/2020   CHOLHDL 2.8 07/07/2020     Hypertension and CKD: She is under the care of Dr. FoRoyce Macadamialast seen last week, no notes yet received.  Last note was from 12/2020.  At that time, BP was elevated, and metoprolol dose was increased to 5057mID. She was prescribed a new BP cuff to monitor her BP. She states she didn't get this, her insurance didn't cover a BP monitor. She reports taking amlodipine 58m19malapril 58mg49msix 58mg 37m and 50mg o34mtoprolol BID   She reports she was  told everything was fine at her visit last week, no changes were made. She had [facility] done through [facility].    Denies dizziness, headaches. Denies side effects of medications. Denies wheezing.  Labs done by nephro in July included normal vitamin D at 69 (and pt advised to continue 1000 IU daily), Cr 1.47, elevated PTH, and elevated microalbumin/Cr ratio (249.5). Lab Results  Component Value Date   CREATININE 1.69 (H) 02/18/2021    H/o toe pain--gout and degenerative changes. On allopurinol for prevention, with colchicine for prn use (hasn't needed in a long time).  No  longer having any discomfort in her toe. Allopurinol dose was increased from 100 to 262m in 12/2020 when uric acid level was 7.2. Repeat was improved. She denies any pain. Lab Results  Component Value Date   LABURIC 4.9 02/18/2021    Obesity: She had lost 49# [facility], stopped in 08/2018. She has gained a lot of weight after she stopped going.  She gets just a little exercise. She cut out fast food since last visit, and has lost 10# in the last 6 months.  She continues to limit her portions, avoids fried foods at home (and doesn't go out much, just once a week, and only rare fried foods), eating more vegetables. She drinks water and Diet Sprite only.   Immunization History  Administered Date(s) Administered   Influenza Split 05/05/2011, 03/23/2012   Influenza, High Dose Seasonal PF 04/03/2014, 02/20/2015, 03/29/2016, 03/11/2017, 04/28/2018, 03/14/2019, 03/17/2021   Influenza-Unspecified 05/01/2020   PFIZER Comirnaty(Gray Top)Covid-19 Tri-Sucrose Vaccine 01/05/2021   PFIZER(Purple Top)SARS-COV-2 Vaccination 07/03/2019, 07/23/2019, 05/01/2020   PNEUMOCOCCAL CONJUGATE-20 03/17/2021   Pfizer Covid-19 Vaccine Bivalent Booster 129yr& up 03/17/2021   Pneumococcal Conjugate-13 12/10/2013   Pneumococcal Polysaccharide-23 01/26/2010, 07/07/2020   Tdap 10/19/2007, 06/12/2018   Zoster, Live 10/19/2007   Last Pap smear: 07/2017, normal, no high risk HPV (by Dr. SiQuincy Simmonds S/p LEEP for +HR HPV, final pathology LGSIL in 05/2015 with Dr. SiQuincy SimmondsLast mammogram: 01/2021 Last colonoscopy: 11/26/13, +polyp, Dr. MaCollene Mares Repeat was due 11/2018. Had visit with Dr. MaCollene Mares0/2022 (colonoscopy not recommended, ?if discussed) Last DEXA: 01/2010--normal Ophtho: twice yearly Dentist: has dentures, and just one tooth; last seen 02/2013 (got new dentures) Exercise: None.   Patient Care Team: KnRita OharaMD as PCP - General (Family Medicine) SkJerline PainMD as PCP - Cardiology  (Cardiology) Ophtho: Dr. GrKaty Fitchephro: Dr. LoHarrie JeansI: Dr. MaCollene MaresYN: Dr. SiQuincy Simmondsno longer sees) Ortho: Dr. BeTonita Congeight Loss: Drs. Beasley/Kadolph (no longer sees)  Depression Screening: [place] Total Score 0        Falls screen:  Fall Risk  07/13/2021 07/07/2020 06/13/2019 05/18/2018 05/16/2017  Falls in the past year? 0 1 0 0 No  Number falls in past yr: 0 0 - - -  Injury with Fall? 0 0 - - -  Comment - bruising on right knee (shin area) - - -  Risk for fall due to : No Fall Risks - - - -  Follow up Falls evaluation completed - - - -     Functional Status Survey: Is the patient deaf or have difficulty hearing?: Yes Does the patient have difficulty seeing, even when wearing glasses/contacts?: Yes (eye sometimes tear and are blurry) Does the patient have difficulty concentrating, remembering, or making decisions?: No Does the patient have difficulty walking or climbing stairs?: Yes (knees) Does  the patient have difficulty dressing or bathing?: No Does the patient have difficulty doing errands alone such as visiting a doctor's office or shopping?: No  Had hearing checked, was referred to ENT, but not seen yet.  Mini-Cog Scoring: 5     End of Life Discussion:  Patient has a living will and medical power of attorney, scanned into chart.   PMH, PSH, SH and FH were updated and reviewed.  Outpatient Encounter Medications as of 07/13/2021  Medication Sig Note   ACCU-CHEK AVIVA PLUS test strip TEST BLOOD SUGAR TWICE DAILY    Accu-Chek Softclix Lancets lancets TEST BLOOD SUGAR TWICE DAILY    allopurinol (ZYLOPRIM) 100 MG tablet TAKE 2 TABLETS (200 MG TOTAL) BY MOUTH DAILY. (Patient taking differently: Take 100 mg by mouth 2 (two) times daily.)    amLODipine (NORVASC) 10 MG tablet Take 1 tablet (10 mg total) by mouth daily.    aspirin 81 MG tablet Take 81 mg by mouth daily.    atorvastatin (LIPITOR) 80 MG  tablet TAKE 1 TABLET EVERY DAY    Blood Glucose Monitoring Suppl (ACCU-CHEK NANO SMARTVIEW) w/Device KIT 1 each by Does not apply route 2 (two) times daily. BID    brimonidine (ALPHAGAN) 0.2 % ophthalmic solution Place 1 drop into both eyes 2 (two) times daily.    dorzolamide-timolol (COSOPT) 22.3-6.8 MG/ML ophthalmic solution Place 1 drop into both eyes 2 (two) times daily.    DROPLET INSULIN SYRINGE 31G X 5/16" 0.5 ML MISC USE EVERY DAY    enalapril (VASOTEC) 10 MG tablet TAKE 1 TABLET EVERY DAY    ezetimibe (ZETIA) 10 MG tablet TAKE 1 TABLET EVERY DAY    furosemide (LASIX) 20 MG tablet Take 10 mg by mouth every Monday, Wednesday, and Friday.    insulin glargine (LANTUS) 100 UNIT/ML injection INJECT 20 UNITS EVERY NIGHT AT BEDTIME (DISCARD OPEN VIAL 28 DAYS AFTER FIRST OPEN)    latanoprost (XALATAN) 0.005 % ophthalmic solution Place 1 drop into both eyes at bedtime.    metoprolol tartrate (LOPRESSOR) 50 MG tablet Take 50 mg by mouth 2 (two) times daily.    omeprazole (PRILOSEC) 40 MG capsule Take 40 mg by mouth daily. 20 min. before breakfast    pioglitazone (ACTOS) 30 MG tablet TAKE 1 TABLET EVERY DAY    acetaminophen (TYLENOL) 325 MG tablet Take 650 mg by mouth every 6 (six) hours as needed for mild pain or moderate pain. (Patient not taking: Reported on 07/13/2021) 07/13/2021: As needed   No facility-administered encounter medications on file as of 07/13/2021.   No Known Allergies   ROS:  The patient denies anorexia, fever, headaches, vision changes (blurry if eyes are watery), ear pain, sore throat, breast concerns, chest pain, palpitations, dizziness, syncope, dyspnea on exertion, cough, nausea, vomiting, diarrhea, constipation, abdominal pain, melena, hematochezia, indigestion/heartburn, hematuria, incontinence, dysuria, vaginal bleeding, discharge, odor or itch, genital lesions, denies numbness, tingling, weakness, tremor, suspicious skin lesions, depression, anxiety, abnormal bleeding or  enlarged lymph nodes.  R>L knee pain with walking only, no swelling or giving way. Decrease in hearing, awaiting consult. Some swelling in her feet only when she sits too long, occasionally Urinating at night, up every 2 hours. No frequency during day. Denies toe pain. Rare sensation of food getting caught in chest--improved after dilation (but not completely).    PHYSICAL EXAM:  BP 124/70    Pulse 60    Ht 5' 1.75" (1.568 m)    Wt 269 lb 9.6 oz (122.3 kg)  LMP 06/14/1998 (Approximate)    BMI 49.71 kg/m   Wt Readings from Last 3 Encounters:  07/13/21 269 lb 9.6 oz (122.3 kg)  05/15/21 280 lb (127 kg)  03/27/21 282 lb (127.9 kg)   06/13/19 258 lb 12.8 oz (117.4 kg)  Weight was 248# at her CPE 05/2018, and 285# 6.4oz at CPE 05/2017  General Appearance:     Alert, cooperative, no distress, appears stated age. Morbidly obese.  Head:     Normocephalic, without obvious abnormality, atraumatic   Eyes:     PERRL, conjunctiva/corneas clear, EOM's intact, fundi not well visualized  Ears:     Normal TM's and external ear canals.   Nose:    Not examined, wearing mask due to COVID-19 pandemic  Throat:    Not examined, wearing mask due to COVID-19 pandemic  Neck:    Supple, no lymphadenopathy;  thyroid:  no enlargement/ tenderness/nodules; no carotid bruit or JVD   Back:     Spine nontender, no curvature, ROM normal, no CVA tenderness   Lungs:      Clear to auscultation bilaterally without wheezes, rales or ronchi; respirations unlabored   Chest Wall:     No tenderness or deformity    Heart:     Regular rate and rhythm, S1 and S2 normal, no murmur, rub or gallop   Breast Exam:    Normal appearance, no masses or tenderness, Exam performed in chair per pt request. No nipple retraction or dimpling, No nipple discharge or bleeding, No axillary lymphadenopathy  Abdomen:      Soft, obese, non-tender, nondistended, normoactive bowel sounds, no masses, no hepatosplenomegaly.  Genitalia:   Patient  refused exam  Rectal:   Patient refused exam  Extremities:    No clubbing, cyanosis.  Trace edema. Normal sensation to monofilament. No lesions  Pulses:    2+ and symmetric all extremities   Skin:    Skin color, texture, turgor normal, no rashes or lesions   Lymph nodes:    Cervical, supraclavicular nodes normal   Neurologic:    Normal strength, sensation; reflexes 2+ and symmetric throughout.                 Psych:   Normal mood, affect, hygiene and grooming   Normal diabetic foot exam  Lab Results  Component Value Date   HGBA1C 6.1 (A) 07/13/2021     ASSESSMENT/PLAN:  Annual physical exam  Encounter for Medicare annual wellness exam  Gout, unspecified cause, unspecified chronicity, unspecified site - cont 289m allopurinol daily  Type 2 diabetes mellitus with stage 3b chronic kidney disease, with long-term current use of insulin ([place] - significantly improved, A1c 6.1.  Concern regarding poss hypoglycemia, on BB. Will have her cut dose to 18U as she hopefully continues to lose weight - Plan: HgB A1c, Glucose, random, TSH  Essential hypertension - well controlled on current regimen  Mixed hyperlipidemia - due for recheck; cont atorvastatin and zetia - Plan: Lipid panel  Morbid obesity with BMI of 45.0-49.9, adult ([place] - down 10# since last visit. Reviewed proper diet, exercise, and hope for ongoing weight loss  Medication monitoring encounter - Plan: Glucose, random, TSH, Lipid panel, Hepatic function panel  Hiatal hernia - on omeprazole per GI. s/p dilation and dysphagia improved  Labs and OV note from Dr. FRoyce Macadamiacame during visit.  No need for chem, just needs LFT's, fasting glu.  Recheck TSH (was elevated with Dr. MCollene Maresin 03/2021).  Discussed chair exercises to get her  cardio and weight-bearing exercise.  Per chart, should be due for RF allopurinol, zetia, actos.--declines. "I got plenty of medicine"  Discussed monthly self breast exams and yearly mammograms; at least  30 minutes of aerobic activity at least 5 days/week, weight-bearing exercise 2x/week; proper sunscreen use reviewed; healthy diet, including goals of calcium and vitamin D intake and alcohol recommendations (less than or equal to 1 drink/day) reviewed; regular seatbelt use; changing batteries in smoke detectors.  Immunization recommendations discussed--continue yearly high dose flu shots. Shingrix recommended, to get at pharmacy, risks SE reviewed.  Colonoscopy recommendations reviewed--was due 11/2018. Unsure if this was addressed at October visit (mainly for dysphagia). Encouraged to double check with Dr. Lorie Apley office to see if she needs further colonoscopy or not.  MOST form reviewed/updated today.  Full Code, Full Care.  F/u 6 months, sooner prn   Medicare Attestation I have personally reviewed: The patient's medical and social history Their use of alcohol, tobacco or illicit drugs Their current medications and supplements The patient's functional ability including ADLs,fall risks, home safety risks, cognitive, and hearing and visual impairment Diet and physical activities Evidence for depression or mood disorders  The patient's weight, height, BMI have been recorded in the chart.  I have made referrals, counseling, and provided education to the patient based on review of the above and I have provided the patient with a written personalized care plan for preventive services.

## 2021-07-13 ENCOUNTER — Other Ambulatory Visit: Payer: Self-pay

## 2021-07-13 ENCOUNTER — Ambulatory Visit: Payer: Medicare PPO | Admitting: Family Medicine

## 2021-07-13 ENCOUNTER — Encounter: Payer: Self-pay | Admitting: Family Medicine

## 2021-07-13 VITALS — BP 124/70 | HR 60 | Ht 61.75 in | Wt 269.6 lb

## 2021-07-13 DIAGNOSIS — M109 Gout, unspecified: Secondary | ICD-10-CM | POA: Diagnosis not present

## 2021-07-13 DIAGNOSIS — I10 Essential (primary) hypertension: Secondary | ICD-10-CM | POA: Diagnosis not present

## 2021-07-13 DIAGNOSIS — Z5181 Encounter for therapeutic drug level monitoring: Secondary | ICD-10-CM

## 2021-07-13 DIAGNOSIS — K449 Diaphragmatic hernia without obstruction or gangrene: Secondary | ICD-10-CM

## 2021-07-13 DIAGNOSIS — E1122 Type 2 diabetes mellitus with diabetic chronic kidney disease: Secondary | ICD-10-CM | POA: Diagnosis not present

## 2021-07-13 DIAGNOSIS — E782 Mixed hyperlipidemia: Secondary | ICD-10-CM

## 2021-07-13 DIAGNOSIS — Z0000 Encounter for general adult medical examination without abnormal findings: Secondary | ICD-10-CM

## 2021-07-13 DIAGNOSIS — N1832 Chronic kidney disease, stage 3b: Secondary | ICD-10-CM

## 2021-07-13 DIAGNOSIS — Z794 Long term (current) use of insulin: Secondary | ICD-10-CM | POA: Diagnosis not present

## 2021-07-13 DIAGNOSIS — Z6841 Body Mass Index (BMI) 40.0 and over, adult: Secondary | ICD-10-CM

## 2021-07-13 LAB — POCT GLYCOSYLATED HEMOGLOBIN (HGB A1C): Hemoglobin A1C: 6.1 % — AB (ref 4.0–5.6)

## 2021-07-14 ENCOUNTER — Encounter: Payer: Self-pay | Admitting: Family Medicine

## 2021-07-14 LAB — HEPATIC FUNCTION PANEL
ALT: 12 IU/L (ref 0–32)
AST: 18 IU/L (ref 0–40)
Albumin: 4 g/dL (ref 3.7–4.7)
Alkaline Phosphatase: 103 IU/L (ref 44–121)
Bilirubin Total: 0.4 mg/dL (ref 0.0–1.2)
Bilirubin, Direct: 0.14 mg/dL (ref 0.00–0.40)
Total Protein: 6.2 g/dL (ref 6.0–8.5)

## 2021-07-14 LAB — LIPID PANEL
Chol/HDL Ratio: 2.9 ratio (ref 0.0–4.4)
Cholesterol, Total: 120 mg/dL (ref 100–199)
HDL: 41 mg/dL (ref >39)
LDL Chol Calc (NIH): 59 mg/dL (ref 0–99)
Triglycerides: 110 mg/dL (ref 0–149)
VLDL Cholesterol Cal: 20 mg/dL (ref 5–40)

## 2021-07-14 LAB — TSH: TSH: 3.07 u[IU]/mL (ref 0.450–4.500)

## 2021-07-14 LAB — GLUCOSE, RANDOM: Glucose: 107 mg/dL — ABNORMAL HIGH (ref 70–99)

## 2021-08-17 ENCOUNTER — Other Ambulatory Visit: Payer: Self-pay | Admitting: Family Medicine

## 2021-08-17 DIAGNOSIS — M109 Gout, unspecified: Secondary | ICD-10-CM

## 2021-09-14 ENCOUNTER — Other Ambulatory Visit: Payer: Self-pay | Admitting: Family Medicine

## 2021-09-14 DIAGNOSIS — E782 Mixed hyperlipidemia: Secondary | ICD-10-CM

## 2021-09-14 DIAGNOSIS — N1832 Chronic kidney disease, stage 3b: Secondary | ICD-10-CM

## 2021-09-30 ENCOUNTER — Other Ambulatory Visit: Payer: Self-pay | Admitting: Family Medicine

## 2021-09-30 DIAGNOSIS — E1122 Type 2 diabetes mellitus with diabetic chronic kidney disease: Secondary | ICD-10-CM

## 2021-10-26 ENCOUNTER — Other Ambulatory Visit: Payer: Self-pay | Admitting: Family Medicine

## 2021-10-26 DIAGNOSIS — E1169 Type 2 diabetes mellitus with other specified complication: Secondary | ICD-10-CM

## 2021-11-02 ENCOUNTER — Other Ambulatory Visit: Payer: Self-pay | Admitting: Family Medicine

## 2021-12-22 ENCOUNTER — Other Ambulatory Visit: Payer: Self-pay | Admitting: Student

## 2021-12-22 DIAGNOSIS — Z1231 Encounter for screening mammogram for malignant neoplasm of breast: Secondary | ICD-10-CM

## 2022-01-11 ENCOUNTER — Other Ambulatory Visit: Payer: Self-pay | Admitting: Family Medicine

## 2022-01-11 DIAGNOSIS — M109 Gout, unspecified: Secondary | ICD-10-CM

## 2022-01-12 NOTE — Progress Notes (Deleted)
No chief complaint on file.  Patient presents for 6 month f/u on chronic problems.  Diabetes follow-up: She is currently taking 20 Units of Lantus insulin, and 30mg  of pioglitazone. Last A1c was 6.1% in 06/2021 on this regimen. Sugars are running   She denies hypoglycemia, polydipsia or polyuria.  She has urinary frequency at night (every 2 hours), unchanged, not as often during the day. Diabetic eye exam was 02/2021, no retinopathy. Patient follows a low sugar diet and checks feet regularly without concerns.      Hyperlipidemia follow-up: Patient is reportedly following a low-fat, low cholesterol diet. She is compliant with medications (atorvastatin and zetia) and denies medication side effects.   Lipids were at goal on last check: Lab Results  Component Value Date   CHOL 120 07/13/2021   HDL 41 07/13/2021   LDLCALC 59 07/13/2021   TRIG 110 07/13/2021   CHOLHDL 2.9 07/13/2021   Hypertension and CKD (stage 3b): She is under the care of Dr. 07/15/2021. She last saw her in January. No medication changes were made at that time (discussed Farxiga, pt wasn't interested in adding new medication). She reports taking amlodipine 10mg  enalapril 10mg , lasix 10mg  MWF, and 50mg  of metoprolol BID    Denies dizziness, headaches. Denies side effects of medications. Denies wheezing.    H/o toe pain--gout and degenerative changes. On allopurinol for prevention, with colchicine for prn use (hasn't needed in a long time).  No longer having any discomfort in her toe. Allopurinol dose was increased from 100 to 200mg  in 12/2020 when uric acid level was 7.2. Repeat was improved. She denies any pain. Lab Results  Component Value Date   LABURIC 4.9 02/18/2021     Obesity: She had lost 49# when [facility], stopped in 08/2018. She had regained weight, but prior to her physical in January she reported losing 10# in the prior 6 months, by cutting out fast food, limiting portions, fried  foods, eating more vegetables. Drinking water and Diet Sprite only.  Wt Readings from Last 3 Encounters:  07/13/21 269 lb 9.6 oz (122.3 kg)  05/15/21 280 lb (127 kg)  03/27/21 282 lb (127.9 kg)    PMH, PSH, SH reviewed   ROS: Denies fever, chills, URI symptoms, headaches, dizziness.  Denies nausea, vomiting, bowel changes, urinary complaints, bleeding, bruising, rash. Knee pain has improved some.  Denies toe pain. Denies any edema. Dysphagia and chest tightness? generalized weakness with some DOE, UPDATE See HPI   UPDATE  PHYSICAL EXAM:  LMP 06/14/1998 (Approximate)   Wt Readings from Last 3 Encounters:  07/13/21 269 lb 9.6 oz (122.3 kg)  05/15/21 280 lb (127 kg)  03/27/21 282 lb (127.9 kg)   Well developed, pleasant, morbidly obese female, comfortable at rest. Refuses to get up on the exam table (unable to get up in the past). HEENT: conjunctiva and sclera are clear, EOMI Neck: no lymphadenopathy or mass, no bruit Heart: regular rate and rhythm Lungs: clear bilaterally Back: no spinal or CVA tenderness Abdomen: obese, soft, nontender, no mass Extremities: Trace to 1+ edema L>R Psych: normal mood, affect, hygiene and grooming Neuro: alert and oriented.  Skin: normal turgor, no rash or lesions  ***update edema  ASSESSMENT/PLAN:  A1c  RF allopurinol, lipitor, enalapril, zetia, pioglitazone, ?insulin  Schedule CPE/AWV for 6 mos

## 2022-01-13 ENCOUNTER — Telehealth: Payer: Self-pay | Admitting: *Deleted

## 2022-01-13 ENCOUNTER — Ambulatory Visit: Payer: Medicare PPO | Admitting: Family Medicine

## 2022-01-13 DIAGNOSIS — N1832 Chronic kidney disease, stage 3b: Secondary | ICD-10-CM

## 2022-01-13 DIAGNOSIS — M109 Gout, unspecified: Secondary | ICD-10-CM

## 2022-01-13 DIAGNOSIS — E782 Mixed hyperlipidemia: Secondary | ICD-10-CM

## 2022-01-13 DIAGNOSIS — I10 Essential (primary) hypertension: Secondary | ICD-10-CM

## 2022-01-13 NOTE — Telephone Encounter (Signed)
Send no show letter. Med check needs to be rescheduled

## 2022-01-13 NOTE — Telephone Encounter (Signed)

## 2022-01-15 ENCOUNTER — Encounter: Payer: Self-pay | Admitting: Family Medicine

## 2022-01-21 ENCOUNTER — Ambulatory Visit
Admission: RE | Admit: 2022-01-21 | Discharge: 2022-01-21 | Disposition: A | Discharge status: Home / Self Care | Payer: Medicare HMO | Source: Ambulatory Visit | Attending: Student | Admitting: Student

## 2022-01-21 DIAGNOSIS — Z1231 Encounter for screening mammogram for malignant neoplasm of breast: Secondary | ICD-10-CM

## 2022-03-19 ENCOUNTER — Other Ambulatory Visit: Payer: Self-pay | Admitting: Family Medicine

## 2022-03-29 ENCOUNTER — Other Ambulatory Visit: Payer: Self-pay | Admitting: Family Medicine

## 2022-03-29 DIAGNOSIS — E119 Type 2 diabetes mellitus without complications: Secondary | ICD-10-CM

## 2022-03-29 DIAGNOSIS — E1169 Type 2 diabetes mellitus with other specified complication: Secondary | ICD-10-CM

## 2022-06-08 ENCOUNTER — Inpatient Hospital Stay (HOSPITAL_COMMUNITY): Payer: Medicare HMO

## 2022-06-08 ENCOUNTER — Emergency Department (HOSPITAL_COMMUNITY): Payer: Medicare HMO

## 2022-06-08 ENCOUNTER — Inpatient Hospital Stay (HOSPITAL_COMMUNITY)
Admission: EM | Admit: 2022-06-08 | Discharge: 2022-06-11 | DRG: 062 | Disposition: A | Discharge status: Home / Self Care | Payer: Medicare HMO | Attending: Neurology | Admitting: Neurology

## 2022-06-08 DIAGNOSIS — E119 Type 2 diabetes mellitus without complications: Secondary | ICD-10-CM

## 2022-06-08 DIAGNOSIS — E11319 Type 2 diabetes mellitus with unspecified diabetic retinopathy without macular edema: Secondary | ICD-10-CM | POA: Diagnosis present

## 2022-06-08 DIAGNOSIS — Z803 Family history of malignant neoplasm of breast: Secondary | ICD-10-CM | POA: Diagnosis not present

## 2022-06-08 DIAGNOSIS — N1832 Chronic kidney disease, stage 3b: Secondary | ICD-10-CM | POA: Diagnosis present

## 2022-06-08 DIAGNOSIS — N183 Chronic kidney disease, stage 3 unspecified: Secondary | ICD-10-CM | POA: Diagnosis present

## 2022-06-08 DIAGNOSIS — R112 Nausea with vomiting, unspecified: Secondary | ICD-10-CM | POA: Diagnosis present

## 2022-06-08 DIAGNOSIS — R4701 Aphasia: Secondary | ICD-10-CM | POA: Diagnosis present

## 2022-06-08 DIAGNOSIS — G8191 Hemiplegia, unspecified affecting right dominant side: Secondary | ICD-10-CM | POA: Diagnosis present

## 2022-06-08 DIAGNOSIS — Z79899 Other long term (current) drug therapy: Secondary | ICD-10-CM | POA: Diagnosis not present

## 2022-06-08 DIAGNOSIS — R2990 Unspecified symptoms and signs involving the nervous system: Secondary | ICD-10-CM | POA: Diagnosis not present

## 2022-06-08 DIAGNOSIS — I129 Hypertensive chronic kidney disease with stage 1 through stage 4 chronic kidney disease, or unspecified chronic kidney disease: Secondary | ICD-10-CM | POA: Diagnosis present

## 2022-06-08 DIAGNOSIS — E782 Mixed hyperlipidemia: Secondary | ICD-10-CM | POA: Diagnosis present

## 2022-06-08 DIAGNOSIS — Z6841 Body Mass Index (BMI) 40.0 and over, adult: Secondary | ICD-10-CM

## 2022-06-08 DIAGNOSIS — Z7982 Long term (current) use of aspirin: Secondary | ICD-10-CM

## 2022-06-08 DIAGNOSIS — Z823 Family history of stroke: Secondary | ICD-10-CM | POA: Diagnosis not present

## 2022-06-08 DIAGNOSIS — I639 Cerebral infarction, unspecified: Principal | ICD-10-CM

## 2022-06-08 DIAGNOSIS — I10 Essential (primary) hypertension: Secondary | ICD-10-CM | POA: Diagnosis present

## 2022-06-08 DIAGNOSIS — G459 Transient cerebral ischemic attack, unspecified: Principal | ICD-10-CM | POA: Diagnosis present

## 2022-06-08 DIAGNOSIS — M109 Gout, unspecified: Secondary | ICD-10-CM | POA: Diagnosis present

## 2022-06-08 DIAGNOSIS — E1122 Type 2 diabetes mellitus with diabetic chronic kidney disease: Secondary | ICD-10-CM | POA: Diagnosis present

## 2022-06-08 DIAGNOSIS — E441 Mild protein-calorie malnutrition: Secondary | ICD-10-CM | POA: Diagnosis present

## 2022-06-08 DIAGNOSIS — H409 Unspecified glaucoma: Secondary | ICD-10-CM | POA: Diagnosis present

## 2022-06-08 DIAGNOSIS — Z9282 Status post administration of tPA (rtPA) in a different facility within the last 24 hours prior to admission to current facility: Secondary | ICD-10-CM | POA: Diagnosis not present

## 2022-06-08 DIAGNOSIS — R29704 NIHSS score 4: Secondary | ICD-10-CM | POA: Diagnosis present

## 2022-06-08 DIAGNOSIS — Z794 Long term (current) use of insulin: Secondary | ICD-10-CM

## 2022-06-08 DIAGNOSIS — Z8249 Family history of ischemic heart disease and other diseases of the circulatory system: Secondary | ICD-10-CM

## 2022-06-08 DIAGNOSIS — Z87891 Personal history of nicotine dependence: Secondary | ICD-10-CM

## 2022-06-08 DIAGNOSIS — R7401 Elevation of levels of liver transaminase levels: Secondary | ICD-10-CM | POA: Diagnosis present

## 2022-06-08 DIAGNOSIS — I161 Hypertensive emergency: Secondary | ICD-10-CM | POA: Diagnosis present

## 2022-06-08 DIAGNOSIS — R471 Dysarthria and anarthria: Secondary | ICD-10-CM | POA: Diagnosis present

## 2022-06-08 DIAGNOSIS — Z833 Family history of diabetes mellitus: Secondary | ICD-10-CM | POA: Diagnosis not present

## 2022-06-08 LAB — DIFFERENTIAL
Abs Immature Granulocytes: 0.02 10*3/uL (ref 0.00–0.07)
Basophils Absolute: 0.1 10*3/uL (ref 0.0–0.1)
Basophils Relative: 1 %
Eosinophils Absolute: 0.2 10*3/uL (ref 0.0–0.5)
Eosinophils Relative: 3 %
Immature Granulocytes: 0 %
Lymphocytes Relative: 21 %
Lymphs Abs: 1.6 10*3/uL (ref 0.7–4.0)
Monocytes Absolute: 0.6 10*3/uL (ref 0.1–1.0)
Monocytes Relative: 8 %
Neutro Abs: 5.2 10*3/uL (ref 1.7–7.7)
Neutrophils Relative %: 67 %

## 2022-06-08 LAB — COMPREHENSIVE METABOLIC PANEL
ALT: 91 U/L — ABNORMAL HIGH (ref 0–44)
AST: 83 U/L — ABNORMAL HIGH (ref 15–41)
Albumin: 3.5 g/dL (ref 3.5–5.0)
Alkaline Phosphatase: 184 U/L — ABNORMAL HIGH (ref 38–126)
Anion gap: 8 (ref 5–15)
BUN: 19 mg/dL (ref 8–23)
CO2: 24 mmol/L (ref 22–32)
Calcium: 8.8 mg/dL — ABNORMAL LOW (ref 8.9–10.3)
Chloride: 104 mmol/L (ref 98–111)
Creatinine, Ser: 1.36 mg/dL — ABNORMAL HIGH (ref 0.44–1.00)
GFR, Estimated: 40 mL/min — ABNORMAL LOW (ref >60)
Glucose, Bld: 136 mg/dL — ABNORMAL HIGH (ref 70–99)
Potassium: 4.2 mmol/L (ref 3.5–5.1)
Sodium: 136 mmol/L (ref 135–145)
Total Bilirubin: 0.5 mg/dL (ref 0.3–1.2)
Total Protein: 6.5 g/dL (ref 6.5–8.1)

## 2022-06-08 LAB — CBG MONITORING, ED: Glucose-Capillary: 147 mg/dL — ABNORMAL HIGH (ref 70–99)

## 2022-06-08 LAB — I-STAT CHEM 8, ED
BUN: 20 mg/dL (ref 8–23)
Calcium, Ion: 1.07 mmol/L — ABNORMAL LOW (ref 1.15–1.40)
Chloride: 103 mmol/L (ref 98–111)
Creatinine, Ser: 1.3 mg/dL — ABNORMAL HIGH (ref 0.44–1.00)
Glucose, Bld: 138 mg/dL — ABNORMAL HIGH (ref 70–99)
HCT: 42 % (ref 36.0–46.0)
Hemoglobin: 14.3 g/dL (ref 12.0–15.0)
Potassium: 4.3 mmol/L (ref 3.5–5.1)
Sodium: 139 mmol/L (ref 135–145)
TCO2: 25 mmol/L (ref 22–32)

## 2022-06-08 LAB — MRSA NEXT GEN BY PCR, NASAL: MRSA by PCR Next Gen: NOT DETECTED

## 2022-06-08 LAB — CBC
HCT: 43.2 % (ref 36.0–46.0)
Hemoglobin: 13.1 g/dL (ref 12.0–15.0)
MCH: 28.1 pg (ref 26.0–34.0)
MCHC: 30.3 g/dL (ref 30.0–36.0)
MCV: 92.5 fL (ref 80.0–100.0)
Platelets: 230 10*3/uL (ref 150–400)
RBC: 4.67 MIL/uL (ref 3.87–5.11)
RDW: 15.3 % (ref 11.5–15.5)
WBC: 7.7 10*3/uL (ref 4.0–10.5)
nRBC: 0 % (ref 0.0–0.2)

## 2022-06-08 LAB — APTT: aPTT: 27 seconds (ref 24–36)

## 2022-06-08 LAB — PROTIME-INR
INR: 1.1 (ref 0.8–1.2)
Prothrombin Time: 13.6 seconds (ref 11.4–15.2)

## 2022-06-08 LAB — ETHANOL: Alcohol, Ethyl (B): <10 mg/dL (ref <10)

## 2022-06-08 MED ORDER — PANTOPRAZOLE SODIUM 40 MG IV SOLR
40.0000 mg | Freq: Every day | INTRAVENOUS | Status: DC
  Administered 2022-06-08: 40 mg via INTRAVENOUS
  Filled 2022-06-08: qty 10

## 2022-06-08 MED ORDER — STROKE: EARLY STAGES OF RECOVERY BOOK
Freq: Once | Status: AC
  Filled 2022-06-08: qty 1

## 2022-06-08 MED ORDER — SENNOSIDES-DOCUSATE SODIUM 8.6-50 MG PO TABS: 1.0000 | ORAL_TABLET | Freq: Every evening | ORAL | Status: DC | PRN

## 2022-06-08 MED ORDER — HYDRALAZINE HCL 20 MG/ML IJ SOLN
INTRAMUSCULAR | Status: AC
  Administered 2022-06-08: 10 mg
  Filled 2022-06-08: qty 1

## 2022-06-08 MED ORDER — ACETAMINOPHEN 325 MG PO TABS
650.0000 mg | ORAL_TABLET | ORAL | Status: DC | PRN
  Administered 2022-06-09: 650 mg via ORAL
  Filled 2022-06-08: qty 2

## 2022-06-08 MED ORDER — TENECTEPLASE FOR STROKE
25.0000 mg | PACK | Freq: Once | INTRAVENOUS | Status: AC
  Administered 2022-06-08: 25 mg via INTRAVENOUS
  Filled 2022-06-08: qty 10

## 2022-06-08 MED ORDER — SODIUM CHLORIDE 0.9% FLUSH
3.0000 mL | Freq: Once | INTRAVENOUS | Status: AC
  Administered 2022-06-08: 3 mL via INTRAVENOUS

## 2022-06-08 MED ORDER — ACETAMINOPHEN 650 MG RE SUPP: 650.0000 mg | RECTAL | Status: DC | PRN

## 2022-06-08 MED ORDER — CLEVIDIPINE BUTYRATE 0.5 MG/ML IV EMUL: 0.0000 mg/h | INTRAVENOUS | Status: DC

## 2022-06-08 MED ORDER — ACETAMINOPHEN 160 MG/5ML PO SOLN: 650.0000 mg | ORAL | Status: DC | PRN

## 2022-06-08 MED ORDER — CLEVIDIPINE BUTYRATE 0.5 MG/ML IV EMUL
INTRAVENOUS | Status: AC
  Administered 2022-06-08: 2 mg/h via INTRAVENOUS
  Filled 2022-06-08: qty 100

## 2022-06-08 MED ORDER — IOHEXOL 350 MG/ML SOLN
75.0000 mL | Freq: Once | INTRAVENOUS | Status: AC | PRN
  Administered 2022-06-08: 75 mL via INTRAVENOUS

## 2022-06-08 MED ORDER — SODIUM CHLORIDE 0.9 % IV SOLN: INTRAVENOUS | Status: DC

## 2022-06-08 MED ORDER — LABETALOL HCL 5 MG/ML IV SOLN
INTRAVENOUS | Status: AC
  Filled 2022-06-08: qty 4

## 2022-06-08 NOTE — Code Documentation (Signed)
Stroke Response Nurse Documentation Code Documentation  Kerri Richardson is a 81 y.o. female arriving to [facility]  via [facility] on 06-08-2022 with past medical hx of DM, HTN, CKD, gout. On No antithrombotic. Code stroke was activated by EMS.   Patient from Home where she was LKW at 1303 and now complaining of difficulty speaking.  She spoke on the phone with a close friend who she considers family at 5, they had a normal conversation.  At 1346 when the friend arrived to pick her up she was not speaking well.  EMS witnessed 2 occurences of right arm and leg being flaccid.    Stroke team at the bedside on patient arrival. Labs drawn and patient cleared for CT by Dr. Elpidio Anis. Patient to CT with team. NIHSS 6, see documentation for details and code stroke times. Patient with disoriented, not following commands, right facial droop, Global aphasia , and dysarthria  on exam. The following imaging was completed:  CT Head and CTA. Patient is a candidate for IV Thrombolytic.  TNK given at 1436. Patient is not a candidate for IR due to no LVO on CTA.   Care Plan: mNIHSS and VS q 15 min x 2 hours, then q67min x 6 hours then q 1 hour x 16 hours.   Bedside handoff with ED RN Leeroy Bock.    Marcellina Millin  Stroke Response RN

## 2022-06-08 NOTE — ED Provider Notes (Signed)
[place] EMERGENCY DEPARTMENT Provider Note   CSN: [id number] Arrival date & time: 06/08/22  1416     History  No chief complaint on file.   Kerri Richardson is a 81 y.o. female.  With PMH of DM2, CKD, HTN brought in by EMS for code stroke due to aphasia, right-sided weakness.  No known blood thinners.  Last known normal 1303 today.  Patient denying any pain.  According to friend who came to check up on patient, she was confused, aphasic and right sided weakness neglecting that side.  HPI     Home Medications Prior to Admission medications   Medication Sig Start Date End Date Taking? Authorizing Provider  ACCU-CHEK AVIVA PLUS test strip TEST BLOOD SUGAR TWICE DAILY 11/02/21   Rita Ohara, MD  Accu-Chek Softclix Lancets lancets TEST BLOOD SUGAR TWICE DAILY 03/19/22   Rita Ohara, MD  acetaminophen (TYLENOL) 325 MG tablet Take 650 mg by mouth every 6 (six) hours as needed for mild pain or moderate pain. Patient not taking: Reported on 07/13/2021    [provider]  allopurinol (ZYLOPRIM) 100 MG tablet TAKE 2 TABLETS EVERY DAY 08/17/21   Rita Ohara, MD  amLODipine (NORVASC) 10 MG tablet Take 1 tablet (10 mg total) by mouth daily. 08/09/12   Rita Ohara, MD  aspirin 81 MG tablet Take 81 mg by mouth daily.    [provider]  atorvastatin (LIPITOR) 80 MG tablet TAKE 1 TABLET EVERY DAY 10/26/21   Rita Ohara, MD  Blood Glucose Monitoring Suppl (ACCU-CHEK NANO SMARTVIEW) w/Device KIT 1 each by Does not apply route 2 (two) times daily. BID 02/02/18   Rita Ohara, MD  brimonidine (ALPHAGAN) 0.2 % ophthalmic solution Place 1 drop into both eyes 2 (two) times daily. 06/06/19   [provider]  dorzolamide-timolol (COSOPT) 22.3-6.8 MG/ML ophthalmic solution Place 1 drop into both eyes 2 (two) times daily. 06/06/19   [provider]  [place] X 5/16" 0.5 ML MISC USE EVERY DAY 03/11/21   Rita Ohara, MD  enalapril (VASOTEC) 10 MG  tablet TAKE 1 TABLET EVERY DAY 10/01/18   Rita Ohara, MD  ezetimibe (ZETIA) 10 MG tablet TAKE 1 TABLET EVERY DAY 09/14/21   Rita Ohara, MD  furosemide (LASIX) 20 MG tablet Take 10 mg by mouth every Monday, Wednesday, and Friday.    [provider]  insulin glargine (LANTUS) 100 UNIT/ML injection INJECT 20 UNITS EVERY NIGHT AT BEDTIME (DISCARD OPEN VIAL 28 DAYS AFTER FIRST OPEN) 09/30/21   Rita Ohara, MD  latanoprost (XALATAN) 0.005 % ophthalmic solution Place 1 drop into both eyes at bedtime.    [provider]  metoprolol tartrate (LOPRESSOR) 50 MG tablet Take 50 mg by mouth 2 (two) times daily.    [provider]  omeprazole (PRILOSEC) 40 MG capsule Take 40 mg by mouth daily. 20 min. before breakfast    [provider]  pioglitazone (ACTOS) 30 MG tablet TAKE 1 TABLET EVERY DAY 09/14/21   Rita Ohara, MD      Allergies    Patient has no known allergies.    Review of Systems   Review of Systems  Physical Exam Updated Vital Signs Wt 125 kg   LMP 06/14/1998 (Approximate)   BMI 50.81 kg/m  Physical Exam Constitutional: Alert and answering questions, following commands, aphasic Eyes: Conjunctivae are normal. ENT      Head: Normocephalic and atraumatic. Cardiovascular: S1, S2,  regular rate Respiratory: Normal respiratory effort. O2  sat 97 on RA Gastrointestinal: Soft and nondistended Musculoskeletal: no severe edema Neurologic: Awake and alert, right arm drift, right lower extremity drift, expressive aphasia, no facial droop Skin: Skin is warm, dry and intact. No rash noted. Psychiatric: Mood and affect are normal. Speech and behavior are normal.  ED Results / Procedures / Treatments   Labs (all labs ordered are listed, but only abnormal results are displayed) Labs Reviewed  PROTIME-INR  APTT  CBC  DIFFERENTIAL  COMPREHENSIVE METABOLIC PANEL  ETHANOL  I-STAT CHEM 8, ED  CBG MONITORING, ED    EKG None  Radiology No results  found.  Procedures .Critical Care  Performed by: Elgie Congo, MD Authorized by: Elgie Congo, MD   Critical care provider statement:    Critical care time (minutes):  30   Critical care was necessary to treat or prevent imminent or life-threatening deterioration of the following conditions:  CNS failure or compromise   Critical care was time spent personally by me on the following activities:  Development of treatment plan with patient or surrogate, discussions with consultants, evaluation of patient's response to treatment, examination of patient, ordering and review of laboratory studies, ordering and review of radiographic studies, ordering and performing treatments and interventions, pulse oximetry, re-evaluation of patient's condition, review of old charts and obtaining history from patient or surrogate   Care discussed with: admitting provider       Medications Ordered in ED Medications  sodium chloride flush (NS) 0.9 % injection 3 mL (has no administration in time range)    ED Course/ Medical Decision Making/ A&P                           Medical Decision Making [name] is a 81 y.o. female.  With PMH of DM2, CKD, HTN brought in by EMS for code stroke due to aphasia, right-sided weakness.  No known blood thinners.  Last known normal 1303 today.    Patient presenting to the ED with symptoms concerning for CVA.. On arrival, the patient's airway is intact.  glucose 147 . Last known normal was1303 today. Patient denies recent CVA or trauma, is not anticoagulated no sudden onset headache or other symptoms concerning for [facility], no recent surgery. Based on my exam, patient is VAN positive (for neglect and aphasia) and has an NIHSS of 4. Patient taken for emergent CT upon arrival . Patient is (within 3-4.5 hours after onset of symptoms without any contraindications and is thus a tnK candidate)/.  Spoke with neurology Dr Rory Percy. They aret recommending tNK administration  after BP control. CT negative for hemorrhage. tnK administered and patient admitted under neurology service for monitoring.  Amount and/or Complexity of Data Reviewed Labs: ordered. Radiology: ordered.  Risk Decision regarding hospitalization.    Final Clinical Impression(s) / ED Diagnoses Final diagnoses:  None    Rx / DC Orders ED Discharge Orders     None         Elgie Congo, MD 06/09/22 1704

## 2022-06-08 NOTE — Progress Notes (Signed)
PHARMACIST CODE STROKE RESPONSE  Notified to mix TNK at 1429 by Dr. Lonna Duval TNK preparation completed at 1430  TNK dose = 25 mg IV over 5 seconds.   Issues/delays encountered (if applicable): BP control requiring hydralazine and cleviprex gtt  Kerri Richardson 06/08/22 2:38 PM

## 2022-06-08 NOTE — ED Triage Notes (Signed)
Pt BIB EMS after she turned aphasic and confused over the phone with friend. When EMS arrived patient had rt sided weakness, facial droop and aphasia. Symptoms resolved a came back twice with EMS. On arrival patient disoriented to basic questions, had left sided weakness and slight facial droop.

## 2022-06-08 NOTE — H&P (Signed)
Stroke Neurology Admission History & Physical   CC: Right-sided weakness, confusion and aphasia with somewhat resolving symptoms  History is obtained from: EMS, family later at bedside  HPI: Kerri Richardson is a 81 y.o. female past medical history of CKD 3, diabetes with diabetic retinopathy as complication, hypercholesterolemia, hypertension, presenting to the emergency room for evaluation of sudden onset of word finding difficulty, confusion as well as right-sided weakness.  EMTs report that a family member who came in to check on her called them because she was having a difficult time with her words.  When they arrived, she was flaccid on the right side.  Her right-sided weakness improved on the way but her speech still remained somewhat aphasic.  She was able to tell me her name, month but was not able to name simple objects consistently.  Her repetition remained intact.  The weakness did not recur but speech remained aphasic as below. No family at bedside.  EMTs noted that the family member who had called her is actually not a blood relative and not medical power of attorney either. Risk benefits of TNK were weighed but patient was unable to make the decision and the decision was made to treat based on severely disabling symptoms, and absence of contraindications.   LKW: 1 PM IV thrombolysis given?:  Yes at 1436 hrs. Premorbid modified Rankin scale (mRS):0   ROS: Full ROS was performed and is negative except as noted in the HPI.   Past Medical History:  Diagnosis Date   Arthritis    CKD (chronic kidney disease) stage 3, GFR 30-59 ml/min (HCC)    Dr. [name]   Diabetic retinopathy    DM retinopathy ([place]) 08/17/12   early   Dyspnea    Essential hypertension, benign    Glaucoma    Gout    Pure hypercholesterolemia    Type II or unspecified type diabetes mellitus without mention of complication, not stated as uncontrolled      Family History  Problem Relation Age of Onset    Hypertension Mother    Cancer Mother        lung (nonsmoker)   Stroke Father    Hypertension Father    Diabetes Father    Hypertension Sister    Heart disease Sister 20       stent   Diabetes Sister    Heart disease Sister    Hypertension Sister    Breast cancer Maternal Aunt 27       dec of old age   Diabetes Paternal Grandmother      Social History:   reports that she quit smoking about 21 years ago. Her smoking use included cigarettes. She has never used smokeless tobacco. She reports that she does not drink alcohol and does not use drugs.  Medications  Current Facility-Administered Medications:    [START ON 06/09/2022]  stroke: early stages of recovery book, , Does not apply, Once, Amie Portland, MD   0.9 %  sodium chloride infusion, , Intravenous, Continuous, Amie Portland, MD   acetaminophen (TYLENOL) tablet 650 mg, 650 mg, Oral, Q4H PRN **OR** acetaminophen (TYLENOL) 160 MG/5ML solution 650 mg, 650 mg, Per Tube, Q4H PRN **OR** acetaminophen (TYLENOL) suppository 650 mg, 650 mg, Rectal, Q4H PRN, Amie Portland, MD   clevidipine (CLEVIPREX) 0.5 MG/ML infusion, , , ,    pantoprazole (PROTONIX) injection 40 mg, 40 mg, Intravenous, QHS, Amie Portland, MD   senna-docusate (Senokot-S) tablet 1 tablet, 1 tablet, Oral, QHS PRN, Amie Portland,  MD   sodium chloride flush (NS) 0.9 % injection 3 mL, 3 mL, Intravenous, Once, Elgie Congo, MD  Current Outpatient Medications:    ACCU-CHEK AVIVA PLUS test strip, TEST BLOOD SUGAR TWICE DAILY, Disp: 200 strip, Rfl: 3   Accu-Chek Softclix Lancets lancets, TEST BLOOD SUGAR TWICE DAILY, Disp: 200 each, Rfl: 10   acetaminophen (TYLENOL) 325 MG tablet, Take 650 mg by mouth every 6 (six) hours as needed for mild pain or moderate pain. (Patient not taking: Reported on 07/13/2021), Disp: , Rfl:    allopurinol (ZYLOPRIM) 100 MG tablet, TAKE 2 TABLETS EVERY DAY, Disp: 180 tablet, Rfl: 0   amLODipine (NORVASC) 10 MG tablet, Take 1 tablet (10 mg  total) by mouth daily., Disp: 90 tablet, Rfl: 1   aspirin 81 MG tablet, Take 81 mg by mouth daily., Disp: , Rfl:    atorvastatin (LIPITOR) 80 MG tablet, TAKE 1 TABLET EVERY DAY, Disp: 90 tablet, Rfl: 0   Blood Glucose Monitoring Suppl (ACCU-CHEK NANO SMARTVIEW) w/Device KIT, 1 each by Does not apply route 2 (two) times daily. BID, Disp: 1 kit, Rfl: 0   brimonidine (ALPHAGAN) 0.2 % ophthalmic solution, Place 1 drop into both eyes 2 (two) times daily., Disp: , Rfl:    dorzolamide-timolol (COSOPT) 22.3-6.8 MG/ML ophthalmic solution, Place 1 drop into both eyes 2 (two) times daily., Disp: , Rfl:    DROPLET INSULIN SYRINGE 31G X 5/16" 0.5 ML MISC, USE EVERY DAY, Disp: 100 each, Rfl: 3   enalapril (VASOTEC) 10 MG tablet, TAKE 1 TABLET EVERY DAY, Disp: 90 tablet, Rfl: 0   ezetimibe (ZETIA) 10 MG tablet, TAKE 1 TABLET EVERY DAY, Disp: 90 tablet, Rfl: 0   furosemide (LASIX) 20 MG tablet, Take 10 mg by mouth every Monday, Wednesday, and Friday., Disp: , Rfl:    insulin glargine (LANTUS) 100 UNIT/ML injection, INJECT 20 UNITS EVERY NIGHT AT BEDTIME (DISCARD OPEN VIAL 28 DAYS AFTER FIRST OPEN), Disp: 30 mL, Rfl: 2   latanoprost (XALATAN) 0.005 % ophthalmic solution, Place 1 drop into both eyes at bedtime., Disp: , Rfl:    metoprolol tartrate (LOPRESSOR) 50 MG tablet, Take 50 mg by mouth 2 (two) times daily., Disp: , Rfl:    omeprazole (PRILOSEC) 40 MG capsule, Take 40 mg by mouth daily. 20 min. before breakfast, Disp: , Rfl:    pioglitazone (ACTOS) 30 MG tablet, TAKE 1 TABLET EVERY DAY, Disp: 90 tablet, Rfl: 0  Exam: Current vital signs: BP (!) 144/83 (BP Location: Right Arm)   Pulse 67   Resp 16   Ht _0  (1.575 m)   Wt 123.4 kg   LMP 06/14/1998 (Approximate)   SpO2 95%   BMI 49.75 kg/m  Vital signs in last 24 hours: Pulse Rate:  [67] 67 (12/26 1500) Resp:  [16] 16 (12/26 1500) BP: (144-194)/(75-83) 144/83 (12/26 1500) SpO2:  [95 %] 95 % (12/26 1500) Weight:  [123.4 kg-125 kg] 123.4 kg (12/26  1501) General: Awake alert in no distress HEENT: Normocephalic/atraumatic cardiovascular: Regular rhythm Chest clear Abdomen nondistended nontender Extremities warm well-perfused Neurological exam She is awake alert oriented to self She was able to tell me her correct age She was having a hard time naming simple objects Repetition was intact Comprehension for simple commands was intact but multistep commands were difficult to follow for her. Cranial nerves: Pupils equal round react light, extraocular movements intact, visual fields full, face appears to have a very subtle right lower facial weakness, tongue and palate midline. Motor examination  with no drift in the upper extremities.  Lower extremities-right lower extremity had a mild drift initially but then she had more drifting on the left and by the time CT scan was done, that drift improved. Sensation: Intact without extinction Coordination with no dysmetria NIH stroke scale 1a Level of Conscious.: 0 1b LOC Questions: 1 1c LOC Commands: 0 2 Best Gaze: 0 3 Visual: 0 4 Facial Palsy: 0 5a Motor Arm - left: 0 5b Motor Arm - Right: 1 6a Motor Leg - Left: 0 6b Motor Leg - Right: 1 7 Limb Ataxia: 0 8 Sensory: 0 9 Best Language: 1 10 Dysarthria: 0 11 Extinct. and Inatten.: 0 TOTAL: 4  On repeat exam, NIH went down to 2 with no weakness in the right arm or leg.  Labs I have reviewed labs in epic and the results pertinent to this consultation are: CBC    Component Value Date/Time   WBC 6.3 07/07/2020 1556   WBC 7.4 05/16/2017 1040   RBC 4.88 07/07/2020 1556   RBC 4.51 05/16/2017 1040   HGB 13.5 07/07/2020 1556   HCT 43.4 07/07/2020 1556   PLT 199 07/07/2020 1556   MCV 89 07/07/2020 1556   MCH 27.7 07/07/2020 1556   MCH 27.9 05/16/2017 1040   MCHC 31.1 (L) 07/07/2020 1556   MCHC 32.3 05/16/2017 1040   RDW 13.8 07/07/2020 1556   LYMPHSABS 1.5 07/07/2020 1556   MONOABS 558 03/29/2016 1335   EOSABS 0.2 07/07/2020 1556    BASOSABS 0.1 07/07/2020 1556   CMP     Component Value Date/Time   NA 137 02/18/2021 1009   K 4.9 02/18/2021 1009   CL 102 02/18/2021 1009   CO2 19 (L) 02/18/2021 1009   GLUCOSE 107 (H) 07/13/2021 1444   GLUCOSE 139 (H) 05/16/2017 1040   BUN 42 (H) 02/18/2021 1009   CREATININE 1.69 (H) 02/18/2021 1009   CREATININE 1.64 (H) 05/16/2017 1040   CALCIUM 9.2 02/18/2021 1009   PROT 6.2 07/13/2021 1444   ALBUMIN 4.0 07/13/2021 1444   AST 18 07/13/2021 1444   ALT 12 07/13/2021 1444   ALKPHOS 103 07/13/2021 1444   BILITOT 0.4 07/13/2021 1444   GFRNONAA 27 (L) 07/07/2020 1556   GFRAA 31 (L) 07/07/2020 1556   Imaging I have reviewed the images obtained:  CT-head: NAICP - no bleed.  Assessment:  81 year old past history of CKD 3, diabetes, hypercholesterolemia and hypertension presented to the emergency room for evaluation of sudden onset word finding difficulty confusion as well as transient right-sided weakness.  The weakness and improved at the time of arrival but continued to be aphasic-see exam below. Attempted discussion on risk benefit with the patient which she seemed to be unable to comprehend risks and benefits.  Decision made to use IV TNK due to disabling symptoms of aphasia as well as chart review with no contraindications to the use of IV TNKase. A family member later on came in, she is not the H POA but did acknowledge that the patient would have wanted the treatment done and the wait was done. She will be admitted to the neuro ICU for further evaluation. Current presentation consistent with an acute infarct versus hypertensive emergency. She did require labetalol x 1 as well as starting Cleviprex on low-dose before blood pressure came to goal for IV TNKase administration.  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 -Hyperglycemia management per SSI to maintain glucose 140-124m/dL. -PT/OT/ST therapies and recommendations when able  CNS -Close neuro monitoring  Dysarthria Dysphagia following cerebral infarction  -NPO until cleared by speech -ST -Advance diet as tolerated  Hemiplegia and hemiparesis following cerebral infarction affecting right dominant side-resolved -PT/OT -PM&R consult  RESP No acute changes Monitor clinically  CV Essential (primary) hypertension Hypertensive Emergency -Aggressive BP control, goal SBP <180  -Needed cleviprex at low dose after one dose labetalol to control Bpbefore TNKase -TTE  Hyperlipidemia, unspecified  - Statin for goal LDL < 70  HEME Labs pending -Monitor -transfuse for hgb < 7  ENDO Check A1c -goal HgbA1c < 7  GI/GU History of CKD 3 Await labs Gentle hydration  Fluid/Electrolyte Disorders Labs pending Correct electrolyte derangements as needed.  Check labs again in the morning.  Nutrition E66.9 Obesity  E46 Protein-Calorie Malnutrition Mild Moderate Severe -diet consult  Prophylaxis  DVT: SCD GI: PPI Bowel: Docusate and senna  Diet: NPO until cleared by speech  Code Status: Full Code  THE FOLLOWING WERE PRESENT ON ADMISSION: Acute ischemic stroke, hypertensive emergency, CKD 3 Attempted to discuss risks, benefits, alternatives of IV thrombolysis with the patient and she did nods appropriately yes and no to questions but I do not think she had the capacity to make the decision.  Family was not at bedside and the emergency contact number provided was not family member or H POA.  Decision was made based on no contraindications and disabling symptoms.  CT imaging was reviewed personally prior to IV  thrombolysis administration with no evidence of bleed. -- AAmie Portland MD Neurologist [facility] Pager: [phone] CRITICAL CARE ATTESTATION Performed by: AAmie Portland MD Total critical care time: 40 minutes Critical care time was exclusive of separately billable procedures and treating other patients and/or supervising APPs/Residents/Students Critical care was necessary to treat or prevent imminent or life-threatening deterioration. This patient is critically ill and at significant risk for neurological worsening and/or death and care requires constant monitoring. Critical care was time spent personally by me on the following activities: development of treatment plan with patient and/or surrogate as well as nursing, discussions with consultants, evaluation of patient's response to treatment, examination of patient, obtaining history from patient or surrogate, ordering and performing treatments and interventions, ordering and review of laboratory studies, ordering and review of radiographic studies, pulse oximetry, re-evaluation of patient's condition, participation in multidisciplinary rounds and medical decision making of high complexity in the care of this patient.

## 2022-06-09 ENCOUNTER — Inpatient Hospital Stay (HOSPITAL_COMMUNITY): Payer: Medicare HMO

## 2022-06-09 ENCOUNTER — Encounter (HOSPITAL_COMMUNITY): Payer: Self-pay | Admitting: Student in an Organized Health Care Education/Training Program

## 2022-06-09 ENCOUNTER — Other Ambulatory Visit: Payer: Self-pay

## 2022-06-09 DIAGNOSIS — R2990 Unspecified symptoms and signs involving the nervous system: Secondary | ICD-10-CM

## 2022-06-09 DIAGNOSIS — G459 Transient cerebral ischemic attack, unspecified: Secondary | ICD-10-CM | POA: Diagnosis not present

## 2022-06-09 LAB — LIPID PANEL
Cholesterol: 110 mg/dL (ref 0–200)
HDL: 40 mg/dL — ABNORMAL LOW (ref >40)
LDL Cholesterol: 52 mg/dL (ref 0–99)
Total CHOL/HDL Ratio: 2.8 RATIO
Triglycerides: 90 mg/dL (ref <150)
VLDL: 18 mg/dL (ref 0–40)

## 2022-06-09 LAB — ECHOCARDIOGRAM COMPLETE
AV Mean grad: 8 mmHg
AV Peak grad: 14 mmHg
Ao pk vel: 1.87 m/s
Area-P 1/2: 2.19 cm2
Calc EF: 65.5 %
Height: 62 in
S' Lateral: 2.9 cm
Single Plane A2C EF: 64.4 %
Single Plane A4C EF: 68.8 %
Weight: 4412.73 oz

## 2022-06-09 LAB — RAPID URINE DRUG SCREEN, HOSP PERFORMED
Amphetamines: NOT DETECTED
Barbiturates: NOT DETECTED
Benzodiazepines: NOT DETECTED
Cocaine: NOT DETECTED
Opiates: NOT DETECTED
Tetrahydrocannabinol: NOT DETECTED

## 2022-06-09 LAB — GLUCOSE, CAPILLARY
Glucose-Capillary: 157 mg/dL — ABNORMAL HIGH (ref 70–99)
Glucose-Capillary: 178 mg/dL — ABNORMAL HIGH (ref 70–99)

## 2022-06-09 MED ORDER — ASPIRIN 81 MG PO TBEC
81.0000 mg | DELAYED_RELEASE_TABLET | Freq: Every day | ORAL | Status: DC
  Administered 2022-06-09 – 2022-06-11 (×3): 81 mg via ORAL
  Filled 2022-06-09 (×3): qty 1

## 2022-06-09 MED ORDER — ASPIRIN 81 MG PO TABS: 81.0000 mg | ORAL_TABLET | Freq: Every day | ORAL | Status: DC

## 2022-06-09 MED ORDER — CHLORHEXIDINE GLUCONATE CLOTH 2 % EX PADS
6.0000 | MEDICATED_PAD | Freq: Every day | CUTANEOUS | Status: DC
  Administered 2022-06-09: 6 via TOPICAL

## 2022-06-09 MED ORDER — LATANOPROST 0.005 % OP SOLN
1.0000 [drp] | Freq: Every day | OPHTHALMIC | Status: DC
  Administered 2022-06-09 – 2022-06-10 (×2): 1 [drp] via OPHTHALMIC
  Filled 2022-06-09: qty 2.5

## 2022-06-09 MED ORDER — ATORVASTATIN CALCIUM 80 MG PO TABS
80.0000 mg | ORAL_TABLET | Freq: Every day | ORAL | Status: DC
  Administered 2022-06-09: 80 mg via ORAL
  Filled 2022-06-09: qty 1

## 2022-06-09 MED ORDER — ALLOPURINOL 100 MG PO TABS
100.0000 mg | ORAL_TABLET | Freq: Two times a day (BID) | ORAL | Status: DC
  Administered 2022-06-09 – 2022-06-11 (×5): 100 mg via ORAL
  Filled 2022-06-09 (×5): qty 1

## 2022-06-09 MED ORDER — BRIMONIDINE TARTRATE 0.2 % OP SOLN
1.0000 [drp] | Freq: Two times a day (BID) | OPHTHALMIC | Status: DC
  Administered 2022-06-09 – 2022-06-11 (×5): 1 [drp] via OPHTHALMIC
  Filled 2022-06-09: qty 5

## 2022-06-09 MED ORDER — ORAL CARE MOUTH RINSE: 15.0000 mL | OROMUCOSAL | Status: DC | PRN

## 2022-06-09 MED ORDER — METOPROLOL TARTRATE 50 MG PO TABS
50.0000 mg | ORAL_TABLET | Freq: Two times a day (BID) | ORAL | Status: DC
  Administered 2022-06-09 – 2022-06-11 (×5): 50 mg via ORAL
  Filled 2022-06-09 (×5): qty 1

## 2022-06-09 MED ORDER — INSULIN ASPART 100 UNIT/ML IJ SOLN
0.0000 [IU] | INTRAMUSCULAR | Status: DC
  Administered 2022-06-09 (×2): 3 [IU] via SUBCUTANEOUS
  Administered 2022-06-10 – 2022-06-11 (×5): 2 [IU] via SUBCUTANEOUS

## 2022-06-09 MED ORDER — DORZOLAMIDE HCL-TIMOLOL MAL 2-0.5 % OP SOLN
1.0000 [drp] | Freq: Two times a day (BID) | OPHTHALMIC | Status: DC
  Administered 2022-06-09 – 2022-06-11 (×5): 1 [drp] via OPHTHALMIC
  Filled 2022-06-09: qty 10

## 2022-06-09 MED ORDER — CLOPIDOGREL BISULFATE 75 MG PO TABS
75.0000 mg | ORAL_TABLET | Freq: Every day | ORAL | Status: DC
  Administered 2022-06-09 – 2022-06-11 (×3): 75 mg via ORAL
  Filled 2022-06-09 (×3): qty 1

## 2022-06-09 MED ORDER — PANTOPRAZOLE SODIUM 40 MG PO TBEC
40.0000 mg | DELAYED_RELEASE_TABLET | Freq: Every day | ORAL | Status: DC
  Administered 2022-06-09 – 2022-06-10 (×2): 40 mg via ORAL
  Filled 2022-06-09 (×2): qty 1

## 2022-06-09 MED ORDER — ENOXAPARIN SODIUM 40 MG/0.4ML IJ SOSY
40.0000 mg | PREFILLED_SYRINGE | INTRAMUSCULAR | Status: DC
  Administered 2022-06-09 – 2022-06-10 (×2): 40 mg via SUBCUTANEOUS
  Filled 2022-06-09 (×2): qty 0.4

## 2022-06-09 NOTE — Evaluation (Signed)
Occupational Therapy Evaluation Patient Details Name: Kerri Richardson MRN: [id number] DOB: 06-Jul-1940 Today's Date: 06/09/2022   History of Present Illness 81 yo female presents [facility] on 12/26 with word finding difficulty, confusion, R weakness. TNK given 1436 on 12/26. CTH negative, MRI and EEG negative. PMH includes  CKD 3, diabetes with diabetic retinopathy, hypercholesterolemia, HTN, glaucoma.   Clinical Impression   PTA patient independent and driving.  Using rollator for limited mobility due to chronic knee pain.  Admitted for above and presents with problem list below.  She requires min guard for transfers and mobility, up to min assist for ADLs.  Mild decreased problem solving noted throughout session, difficulty following multiple step commands- continue cognitive assessment with higher level pill box test recommended. Will follow acutely to optimize independence and return to PLOF, but anticipate no further needs after dc home.      Recommendations for follow up therapy are one component of a multi-disciplinary discharge planning process, led by the attending physician.  Recommendations may be updated based on patient status, additional functional criteria and insurance authorization.   Follow Up Recommendations  No OT follow up     Assistance Recommended at Discharge Intermittent Supervision/Assistance  Patient can return home with the following A little help with walking and/or transfers;A little help with bathing/dressing/bathroom;Assistance with cooking/housework;Assist for transportation;Help with stairs or ramp for entrance    Functional Status Assessment  Patient has had a recent decline in their functional status and demonstrates the ability to make significant improvements in function in a reasonable and predictable amount of time.  Equipment Recommendations  None recommended by OT    Recommendations for Other Services       Precautions / Restrictions  Precautions Precautions: Fall Restrictions Weight Bearing Restrictions: No      Mobility Bed Mobility Overal bed mobility: Needs Assistance Bed Mobility: Supine to Sit     Supine to sit: Supervision     General bed mobility comments: increased time but no physical assist required    Transfers Overall transfer level: Needs assistance Equipment used: Rolling walker (2 wheels) Transfers: Sit to/from Stand Sit to Stand: Min guard           General transfer comment: for safety      Balance Overall balance assessment: Needs assistance Sitting-balance support: No upper extremity supported, Feet supported Sitting balance-Leahy Scale: Fair     Standing balance support: Bilateral upper extremity supported, No upper extremity supported, During functional activity Standing balance-Leahy Scale: Fair Standing balance comment: relies on RW dynamically                           ADL either performed or assessed with clinical judgement   ADL Overall ADL's : Needs assistance/impaired     Grooming: Min guard;Standing           Upper Body Dressing : Set up;Sitting   Lower Body Dressing: Minimal assistance;Sit to/from stand Lower Body Dressing Details (indicate cue type and reason): assist for socks, min guard in standing Toilet Transfer: Min guard;Ambulation;Rolling walker (2 wheels)           Functional mobility during ADLs: Min guard;Rolling walker (2 wheels) General ADL Comments: VSS, pt fatigues easily     Vision Patient Visual Report: No change from baseline       Perception     Praxis      Pertinent Vitals/Pain Pain Assessment Pain Assessment: Faces Faces Pain Scale: Hurts a  little bit Pain Location: chronic bil knee pain Pain Descriptors / Indicators: Discomfort Pain Intervention(s): Limited activity within patient's tolerance, Monitored during session, Repositioned     Hand Dominance Right   Extremity/Trunk Assessment Upper  Extremity Assessment Upper Extremity Assessment: Generalized weakness   Lower Extremity Assessment Lower Extremity Assessment: Defer to PT evaluation   Cervical / Trunk Assessment Cervical / Trunk Assessment: Other exceptions Cervical / Trunk Exceptions: increased body habitus   Communication Communication Communication: HOH   Cognition Arousal/Alertness: Awake/alert Behavior During Therapy: WFL for tasks assessed/performed Overall Cognitive Status: Impaired/Different from baseline Area of Impairment: Problem solving, Following commands, Memory                     Memory: Decreased short-term memory Following Commands: Follows one step commands consistently, Follows one step commands with increased time, Follows multi-step commands inconsistently     Problem Solving: Slow processing, Difficulty sequencing, Requires verbal cues General Comments: some decreased recall, sequencing and problem solving. parts of short blessed test completed- difficulty sequencing months backwards but ultimately only 1 error.  able to recall 3/3 words with delay. anticpate near baseline but continue assessment     General Comments  family friend present and supportive    Exercises     Shoulder Instructions      Home Living Family/patient expects to be discharged to:: Private residence Living Arrangements: Alone Available Help at Discharge: Family;Friend(s);Available PRN/intermittently Type of Home: House Home Access: Ramped entrance     Home Layout: One level     Bathroom Shower/Tub: Chief Strategy Officer: Handicapped height     Home Equipment: Rollator (4 wheels);Shower seat;BSC/3in1   Additional Comments: family/friends check on her      Prior Functioning/Environment Prior Level of Function : Independent/Modified Independent;Driving             Mobility Comments: uses rollator for mobility ADLs Comments: uses BSC at night, independent ADLs/ IADLs  (light)        OT Problem List: Decreased strength;Decreased activity tolerance;Impaired balance (sitting and/or standing);Decreased cognition;Decreased safety awareness;Decreased knowledge of use of DME or AE;Decreased knowledge of precautions;Obesity      OT Treatment/Interventions: Self-care/ADL training;Energy conservation;DME and/or AE instruction;Cognitive remediation/compensation;Therapeutic activities;Patient/family education;Balance training    OT Goals(Current goals can be found in the care plan section) Acute Rehab OT Goals Patient Stated Goal: home OT Goal Formulation: With patient Time For Goal Achievement: 06/23/22 Potential to Achieve Goals: Good  OT Frequency: Min 2X/week    Co-evaluation              AM-PAC OT "6 Clicks" Daily Activity     Outcome Measure Help from another person eating meals?: A Little Help from another person taking care of personal grooming?: A Little Help from another person toileting, which includes using toliet, bedpan, or urinal?: A Little Help from another person bathing (including washing, rinsing, drying)?: A Little Help from another person to put on and taking off regular upper body clothing?: A Little Help from another person to put on and taking off regular lower body clothing?: A Little 6 Click Score: 18   End of Session Equipment Utilized During Treatment: Rolling walker (2 wheels) Nurse Communication: Mobility status  Activity Tolerance: Patient tolerated treatment well Patient left: in chair;with call bell/phone within reach;with family/visitor present  OT Visit Diagnosis: Other abnormalities of gait and mobility (R26.89);Muscle weakness (generalized) (M62.81);Other symptoms and signs involving cognitive function  Time: 3785-8850 OT Time Calculation (min): 12 min Charges:  OT General Charges $OT Visit: 1 Visit OT Evaluation $OT Eval Moderate Complexity: 1 Mod  Barry Brunner, OT Acute Rehabilitation  Services Office [phone]   Chancy Milroy 06/09/2022, 2:27 PM

## 2022-06-09 NOTE — Progress Notes (Signed)
Patient's symptoms improved, CT negative. Given concern for right gaze, will get EEG.   Ritta Slot, MD [facility] [phone]  If 7pm- 7am, please page neurology on call as listed in AMION.

## 2022-06-09 NOTE — Progress Notes (Addendum)
STROKE TEAM PROGRESS NOTE   ATTENDING NOTE: I reviewed above note and agree with the assessment and plan. Pt was seen and examined.   81 year old female with history of diabetes, hypertension, hyperlipidemia, diabetic retinopathy, CKD 3 admitted for right-sided weakness, confusion and speech difficulty.  Right-sided weakness improving on presentation but still persistent speech difficulty.  CT no acute abnormality but a bilateral BG lacunar infarct right more than left, bilateral parietal/occipital burr holes.  Status post TNK.  CT head and neck left ICA bulb 30% stenosis.  Post K CT repeat for nausea vomiting no acute bleeding.  MRI no acute infarct.  MRA unremarkable.  EEG normal.  EF 65 to 70%.  LDL 52, A1c pending, UDS negative.  Creatinine 1.3.  AST/ALT 83/91.  On exam, patient lying bed, no family at bedside.  Awake alert, orientated to place, age, self and time.  No aphasia, follows simple commands, able to name 2/3 and able to repeat.  Visual field full, no gaze palsy, slight right nasolabial fold flattening. LUE 5/5, RUE fluctuate with intermittent drift.  Bilateral lower extremity 3/5.  Sensation symmetrical, finger-nose intact but slow.  Etiology for patient symptoms concerning for TIA given stroke risk factors, aborted with TNK.  Will resume aspirin 81, add Plavix 75, DAPT for 3 weeks and then Plavix alone.  Hold off statin for now given elevated AST/ALT.  Repeat CMP in AM.  PT/OT no recommendation.  For detailed assessment and plan, please refer to above/below as I have made changes wherever appropriate.   Marvel PlanJindong Epic Tribbett, MD PhD Stroke Neurology 06/09/2022 6:28 PM  This patient is critically ill due to TIA with stroke symptoms status post TNK and at significant risk of neurological worsening, death form stroke, bleeding from TNK. This patient's care requires constant monitoring of vital signs, hemodynamics, respiratory and cardiac monitoring, review of multiple databases, neurological  assessment, discussion with family, other specialists and medical decision making of high complexity. I spent 35 minutes of neurocritical care time in the care of this patient.    INTERVAL HISTORY Patient in  bed sleeping. No family at bedside. Patient drowsy with exam.  Vitals:   06/09/22 1000 06/09/22 1030 06/09/22 1100 06/09/22 1200  BP: (!) 127/96 (!) 140/69 (!) 146/64 127/77  Pulse: 75 73 76 78  Resp: 18 20 (!) 24 17  Temp:      TempSrc:      SpO2: 94% 93% 93% 94%  Weight:      Height:       CBC:  Recent Labs  Lab 06/08/22 1630 06/08/22 1637  WBC 7.7  --   NEUTROABS 5.2  --   HGB 13.1 14.3  HCT 43.2 42.0  MCV 92.5  --   PLT 230  --    Basic Metabolic Panel:  Recent Labs  Lab 06/08/22 1630 06/08/22 1637  NA 136 139  K 4.2 4.3  CL 104 103  CO2 24  --   GLUCOSE 136* 138*  BUN 19 20  CREATININE 1.36* 1.30*  CALCIUM 8.8*  --    Lipid Panel:  Recent Labs  Lab 06/09/22 0538  CHOL 110  TRIG 90  HDL 40*  CHOLHDL 2.8  VLDL 18  LDLCALC 52   HgbA1c: No results for input(s): "HGBA1C" in the last 168 hours. Urine Drug Screen:  Recent Labs  Lab 06/09/22 1030  LABOPIA NONE DETECTED  COCAINSCRNUR NONE DETECTED  LABBENZ NONE DETECTED  AMPHETMU NONE DETECTED  THCU NONE DETECTED  LABBARB NONE DETECTED  Alcohol Level  Recent Labs  Lab 06/08/22 1630  ETH <10    IMAGING past 24 hours ECHOCARDIOGRAM COMPLETE  Result Date: 06/09/2022    ECHOCARDIOGRAM REPORT   Patient Name:   Kerri Richardson Date of Exam: 06/09/2022 Medical Rec #:  [id number]        Height:       62.0 in Accession #:    [id number]       Weight:       275.8 lb Date of Birth:  Apr 14, 1941        BSA:          2.191 m Patient Age:    81 years         BP:           136/64 mmHg Patient Gender: F                HR:           68 bpm. Exam Location:  Inpatient Procedure: 2D Echo, Color Doppler and Cardiac Doppler Indications:    Stroke  History:        Patient has prior history of Echocardiogram  examinations, most                 recent 04/15/2021. Risk Factors:Diabetes and Hypertension. CKD.  Sonographer:    Milda Smart Referring Phys: Milon Dikes  Sonographer Comments: Technically difficult study due to poor echo windows and patient is obese. Image acquisition challenging due to patient body habitus and Image acquisition challenging due to respiratory motion. IMPRESSIONS  1. Left ventricular ejection fraction, by estimation, is 65 to 70%. The left ventricle has normal function. The left ventricle has no regional wall motion abnormalities. Left ventricular diastolic parameters are consistent with Grade I diastolic dysfunction (impaired relaxation).  2. Right ventricular systolic function is normal. The right ventricular size is normal.  3. The mitral valve is grossly normal. Trivial mitral valve regurgitation.  4. The aortic valve is tricuspid. There is mild calcification of the aortic valve. There is mild thickening of the aortic valve. Aortic valve regurgitation is not visualized. Aortic valve sclerosis/calcification is present, without any evidence of aortic stenosis.  5. The inferior vena cava is normal in size with greater than 50% respiratory variability, suggesting right atrial pressure of 3 mmHg. Comparison(s): No significant change from prior study. Conclusion(s)/Recommendation(s): No intracardiac source of embolism detected on this transthoracic study. Consider a transesophageal echocardiogram to exclude cardiac source of embolism if clinically indicated. FINDINGS  Left Ventricle: Left ventricular ejection fraction, by estimation, is 65 to 70%. The left ventricle has normal function. The left ventricle has no regional wall motion abnormalities. The left ventricular internal cavity size was normal in size. There is  no left ventricular hypertrophy. Left ventricular diastolic parameters are consistent with Grade I diastolic dysfunction (impaired relaxation). Right Ventricle: The right  ventricular size is normal. No increase in right ventricular wall thickness. Right ventricular systolic function is normal. Left Atrium: Left atrial size was normal in size. Right Atrium: Right atrial size was normal in size. Pericardium: There is no evidence of pericardial effusion. Mitral Valve: The mitral valve is grossly normal. Trivial mitral valve regurgitation. MV peak gradient, 6.7 mmHg. The mean mitral valve gradient is 3.0 mmHg. Tricuspid Valve: The tricuspid valve is normal in structure. Tricuspid valve regurgitation is not demonstrated. Aortic Valve: The aortic valve is tricuspid. There is mild calcification of the aortic valve. There is mild thickening of the aortic valve. Aortic  valve regurgitation is not visualized. Aortic valve sclerosis/calcification is present, without any evidence of aortic stenosis. Aortic valve mean gradient measures 8.0 mmHg. Aortic valve peak gradient measures 14.0 mmHg. Pulmonic Valve: The pulmonic valve was normal in structure. Pulmonic valve regurgitation is trivial. Aorta: The aortic root is normal in size and structure. Venous: The inferior vena cava is normal in size with greater than 50% respiratory variability, suggesting right atrial pressure of 3 mmHg. IAS/Shunts: The atrial septum is grossly normal.  LEFT VENTRICLE PLAX 2D LVIDd:         4.90 cm     Diastology LVIDs:         2.90 cm     LV e' medial:    4.46 cm/s LV PW:         1.10 cm     LV E/e' medial:  21.6 LV IVS:        1.00 cm     LV e' lateral:   6.42 cm/s                            LV E/e' lateral: 15.0  LV Volumes (MOD) LV vol d, MOD A2C: 81.0 ml LV vol d, MOD A4C: 79.4 ml LV vol s, MOD A2C: 28.8 ml LV vol s, MOD A4C: 24.8 ml LV SV MOD A2C:     52.2 ml LV SV MOD A4C:     79.4 ml LV SV MOD BP:      53.4 ml RIGHT VENTRICLE             IVC RV S prime:     11.20 cm/s  IVC diam: 1.10 cm TAPSE (M-mode): 1.6 cm LEFT ATRIUM             Index        RIGHT ATRIUM           Index LA diam:        4.40 cm 2.01 cm/m    RA Area:     15.30 cm LA Vol (A2C):   63.2 ml 28.84 ml/m  RA Volume:   37.30 ml  17.02 ml/m LA Vol (A4C):   68.8 ml 31.39 ml/m LA Biplane Vol: 71.4 ml 32.58 ml/m  AORTIC VALVE AV Vmax:           187.00 cm/s AV Vmean:          138.000 cm/s AV VTI:            0.401 m AV Peak Grad:      14.0 mmHg AV Mean Grad:      8.0 mmHg LVOT Vmax:         151.00 cm/s LVOT Vmean:        101.000 cm/s LVOT VTI:          0.334 m LVOT/AV VTI ratio: 0.83  AORTA Ao Asc diam: 3.00 cm MITRAL VALVE MV Area (PHT): 2.19 cm     SHUNTS MV Peak grad:  6.7 mmHg     Systemic VTI: 0.33 m MV Mean grad:  3.0 mmHg MV Vmax:       1.29 m/s MV Vmean:      75.5 cm/s MV Decel Time: 346 msec MV E velocity: 96.20 cm/s MV A velocity: 123.00 cm/s MV E/A ratio:  0.78 Laurance Flatten MD Electronically signed by Laurance Flatten MD Signature Date/Time: 06/09/2022/12:20:46 PM    Final    EEG adult  Result Date: 06/09/2022  Kerri Quest, MD     06/09/2022  8:31 AM Patient Name: Kerri Richardson MRN: [id number] Epilepsy Attending: Charlsie Richardson Referring Physician/Provider: Rejeana Brock, MD Date: 06/09/2022 Duration: 22.21 mins Patient history: 81 y.o. female past medical history of CKD 3, diabetes with diabetic retinopathy as complication, hypercholesterolemia, hypertension, presenting to the emergency room for evaluation of sudden onset of word finding difficulty. EEG to evaluate for seizure. Level of alertness: Awake AEDs during EEG study: None Technical aspects: This EEG study was done with scalp electrodes positioned according to the 10-20 International system of electrode placement. Electrical activity was reviewed with band pass filter of 1-70Hz , sensitivity of 7 uV/mm, display speed of 15mm/sec with a  notched filter applied as appropriate. EEG data were recorded continuously and digitally stored.  Video monitoring was available and reviewed as appropriate. Description: The posterior dominant rhythm consists of 8 Hz activity  of moderate voltage (25-35 uV) seen predominantly in posterior head regions, symmetric and reactive to eye opening and eye closing. Hyperventilation and photic stimulation were not performed.   IMPRESSION: This study is within normal limits. No seizures or epileptiform discharges were seen throughout the recording. A normal interictal EEG does not exclude the diagnosis of epilepsy. Priyanka Annabelle Harman   CT HEAD WO CONTRAST ( )  Result Date: 06/09/2022 CLINICAL DATA:  80 year old female with acute neurologic deficit. EXAM: CT HEAD WITHOUT CONTRAST TECHNIQUE: Contiguous axial images were obtained from the base of the skull through the vertex without intravenous contrast. RADIATION DOSE REDUCTION: This exam was performed according to the departmental dose-optimization program which includes automated exposure control, adjustment of the mA and/or kV according to patient size and/or use of iterative reconstruction technique. COMPARISON:  Head CT 0200 hours today, and earlier. CTA head and neck yesterday. FINDINGS: Brain: Cerebral volume is within normal limits for age. No midline shift, mass effect, or evidence of intracranial mass lesion. No ventriculomegaly. No acute intracranial hemorrhage identified. Stable confluent bilateral cerebral white matter hypodensity, asymmetric involvement of the right corona radiata, since 06/08/2022. Stable gray-white matter differentiation throughout the brain. Vascular: Calcified atherosclerosis at the skull base. Skull: No acute osseous abnormality identified. Sinuses/Orbits: Right greater than left tympanic cavity and mastoid opacification is unchanged. Paranasal sinuses are stable and well aerated. Other: No acute orbit or scalp soft tissue finding. IMPRESSION: 1. Continued stable non contrast CT appearance of the brain since 06/08/2022. Advanced white matter disease most pronounced in the right corona radiata. 2. Right > left tympanic cavity and mastoid opacification.  Electronically Signed   By: Odessa Fleming M.D.   On: 06/09/2022 08:06   CT HEAD WO CONTRAST ( )  Result Date: 06/09/2022 CLINICAL DATA:  Initial evaluation for mental status change, unknown cause. EXAM: CT HEAD WITHOUT CONTRAST TECHNIQUE: Contiguous axial images were obtained from the base of the skull through the vertex without intravenous contrast. RADIATION DOSE REDUCTION: This exam was performed according to the departmental dose-optimization program which includes automated exposure control, adjustment of the mA and/or kV according to patient size and/or use of iterative reconstruction technique. COMPARISON:  Prior CT from 06/08/2022 and earlier studies. FINDINGS: Brain: Examination mildly degraded by motion. Atrophy with chronic small vessel ischemic disease. Remote lacunar infarcts about the right greater than left basal ganglia. No acute intracranial hemorrhage. No other acute large vessel territory infarct. No mass lesion, mass effect or midline shift. No hydrocephalus or extra-axial fluid collection. Vascular: Some residual contrast material remains on board. Calcified atherosclerosis present about the skull base.  Skull: Remote parietal burr hole craniotomy is noted. No scalp soft tissue abnormality. Sinuses/Orbits: Globes orbital soft tissues demonstrate no acute finding. Mild mucosal thickening noted about the maxillary sinuses. Paranasal sinuses are otherwise largely clear. Bilateral mastoid and middle ear effusions again noted. Other: None. IMPRESSION: 1. Stable head CT.  No acute intracranial abnormality. 2. Atrophy with chronic small vessel ischemic disease with remote lacunar infarcts about the right greater than left basal ganglia. Electronically Signed   By: Rise Mu M.D.   On: 06/09/2022 03:15   CT HEAD WO CONTRAST ( )  Result Date: 06/08/2022 CLINICAL DATA:  Initial evaluation for sudden severe headache. EXAM: CT HEAD WITHOUT CONTRAST TECHNIQUE: Contiguous axial images were  obtained from the base of the skull through the vertex without intravenous contrast. RADIATION DOSE REDUCTION: This exam was performed according to the departmental dose-optimization program which includes automated exposure control, adjustment of the mA and/or kV according to patient size and/or use of iterative reconstruction technique. COMPARISON:  Prior CTs from earlier the same day. FINDINGS: Brain: Residual contrast material on board from prior CTA. Atrophy with chronic small vessel ischemic disease. Remote lacunar infarcts about the right greater than left basal ganglia. No acute intracranial hemorrhage. No visible evolving or acute large vessel territory infarct. No mass lesion, midline shift or mass effect. No hydrocephalus or extra-axial fluid collection. Vascular: Contrast material throughout the intracranial vasculature. Scattered vascular calcifications noted within the carotid siphons. Skull: Scalp soft tissues demonstrate no acute finding. Remote biparietal burr holes noted. Sinuses/Orbits: Globes and orbital soft tissues demonstrate no acute finding. Mild mucosal thickening about the maxillary sinuses. Paranasal sinuses are otherwise largely clear. Bilateral mastoid and middle ear effusions again noted. Other: None. IMPRESSION: 1. No acute intracranial abnormality. 2. Atrophy with chronic small vessel ischemic disease, with remote lacunar infarcts about the right greater than left basal ganglia. Electronically Signed   By: Rise Mu M.D.   On: 06/08/2022 21:03   CT ANGIO HEAD NECK W WO CM (CODE STROKE)  Result Date: 06/08/2022 CLINICAL DATA:  Neuro deficit, acute, stroke suspected. Right-sided weakness. Right-sided facial droop. EXAM: CT ANGIOGRAPHY HEAD AND NECK TECHNIQUE: Multidetector CT imaging of the head and neck was performed using the standard protocol during bolus administration of intravenous contrast. Multiplanar CT image reconstructions and MIPs were obtained to evaluate  the vascular anatomy. Carotid stenosis measurements (when applicable) are obtained utilizing NASCET criteria, using the distal internal carotid diameter as the denominator. RADIATION DOSE REDUCTION: This exam was performed according to the departmental dose-optimization program which includes automated exposure control, adjustment of the mA and/or kV according to patient size and/or use of iterative reconstruction technique. CONTRAST:  25mL OMNIPAQUE IOHEXOL 350 MG/ML SOLN COMPARISON:  Head CT immediately prior FINDINGS: CTA NECK FINDINGS Aortic arch: Aortic atherosclerosis. Branching pattern is normal without origin stenosis. Right carotid system: Common carotid artery widely patent to the bifurcation. Carotid bifurcation is normal without soft or calcified plaque. Cervical ICA is tortuous but widely patent to the skull base. Left carotid system: Common carotid artery widely patent to the bifurcation. Mild calcified plaque at the carotid bifurcation and ICA bulb. Minimal diameter of the ICA bulb is 3.5 mm. Compared to a more distal cervical ICA diameter of 5 mm, this indicates a 30% stenosis. Cervical ICA is tortuous beyond that but widely patent to the skull base. Vertebral arteries: Both vertebral artery origins are normal. Both vertebral arteries are normal through the cervical region to the foramen magnum. Skeleton: Ordinary cervical spondylosis. Other  neck: No mass or lymphadenopathy. Upper chest: Normal Review of the MIP images confirms the above findings CTA HEAD FINDINGS Anterior circulation: Both internal carotid arteries are patent through the skull base and siphon regions. Ordinary siphon atherosclerotic calcification but no stenosis greater than 30%. The anterior and middle cerebral vessels are patent. No large vessel occlusion or proximal stenosis. No aneurysm or vascular malformation. Posterior circulation: Both vertebral arteries patent through the foramen magnum to the basilar artery. No basilar  stenosis. Posterior circulation branch vessels are normal. Venous sinuses: Patent and normal. Anatomic variants: None significant. Review of the MIP images confirms the above findings IMPRESSION: 1. No intracranial large vessel occlusion or proximal stenosis. 2. Aortic atherosclerosis. 3. Atherosclerotic change at the left carotid bifurcation and ICA bulb. 30% stenosis of the ICA bulb. Aortic Atherosclerosis (ICD10-I70.0). Electronically Signed   By: Paulina Fusi M.D.   On: 06/08/2022 15:06   CT HEAD CODE STROKE WO CONTRAST  Result Date: 06/08/2022 CLINICAL DATA:  Code stroke. Neuro deficit, acute, stroke suspected. Right-sided weakness and right facial droop. EXAM: CT HEAD WITHOUT CONTRAST TECHNIQUE: Contiguous axial images were obtained from the base of the skull through the vertex without intravenous contrast. RADIATION DOSE REDUCTION: This exam was performed according to the departmental dose-optimization program which includes automated exposure control, adjustment of the mA and/or kV according to patient size and/or use of iterative reconstruction technique. COMPARISON:  None Available. FINDINGS: Brain: Generalized brain volume loss. Chronic small-vessel ischemic changes affect the cerebral hemispheric white matter. No focal posterior fossa insult. Bilateral parietal/occipital burr holes. More focal area likely chronic white matter ischemic changes noted in the right posterior frontal region. No finding to suggest acute infarction, mass lesion, hemorrhage, hydrocephalus or extra-axial collection presently. Vascular: There is atherosclerotic calcification of the major vessels at the base of the brain. Skull: Negative other than the bilateral parietal/occipital burr holes. Sinuses/Orbits: Sinuses are clear. Orbits are negative. Fluid in the mastoid air cells, right more than left. Some fluid in both middle ears as well. Other: None ASPECTS ([place] Stroke Program Early CT Score) - Ganglionic level  infarction (caudate, lentiform nuclei, internal capsule, insula, M1-M3 cortex): 7 - Supraganglionic infarction (M4-M6 cortex): 3 Total score (0-10 with 10 being normal): 10 IMPRESSION: 1. No acute CT finding. Chronic small-vessel ischemic changes of the cerebral hemispheric white matter. Bilateral parietal/occipital burr holes. 2. Aspects is 10. 3. Fluid in the mastoid air cells, right more than left. Some fluid in both middle ears as well. These results were communicated to Dr. Wilford Corner at 232 pm on 06/08/2022 by text page via the [facility] messaging system. Electronically Signed   By: Paulina Fusi M.D.   On: 06/08/2022 14:35    PHYSICAL EXAM General: Awake alert in no distress HEENT: Normocephalic/atraumatic cardiovascular: Regular rhythm Abdomen nondistended nontender Extremities warm well-perfused  Neurological exam She is awake alert oriented to self She was able to tell me her correct age, named thumb, fist appropriately. Repetition was intact Comprehension for simple commands was intact but multistep commands were difficult to follow for her. Cranial nerves: Pupils equal round react light, extraocular movements intact, visual fields full, face appears to have a very subtle right lower facial weakness, tongue and palate midline. Motor examination with no drift in the upper extremities or lower extremities. Sensation: Intact without extinction Coordination with no dysmetria  ASSESSMENT/PLAN  Kerri Richardson is a 81 y.o. female past medical history of CKD 3, diabetes with diabetic retinopathy as complication, hypercholesterolemia, hypertension, presenting to the  emergency room for evaluation of sudden onset of word finding difficulty, confusion as well as right-sided weakness.  EMTs report that a family member who came in to check on her called them because she was having a difficult time with her words.  When they arrived, she was flaccid on the right side.  Her right-sided weakness improved on the  way but her speech still remained somewhat aphasic.  She was able to tell me her name, month but was not able to name simple objects consistently.  Her repetition remained intact.  The weakness did not recur but speech remained aphasic as below. No family at bedside.  EMTs noted that the family member who had called her is actually not a blood relative and not medical power of attorney either.  Likely TIA given symptoms improving prior to TNK; but TNK given d/t disabling symptoms.  CT head Stable head CT.  No acute intracranial abnormality. 2. Atrophy with chronic small vessel ischemic disease with remote lacunar infarcts about the right greater than left basal ganglia. MRI  no abnormality 2D Echo pending [id number] Carotid dopplers: pending LDL 52 HgbA1c 6.1 VTE prophylaxis - SCD's EEG: no seizures or epileptiform discharges seen.    Diet   Diet Carb Modified Fluid consistency: Thin; Room service appropriate? Yes   aspirin 81 mg daily prior to admission, now on No antithrombotic. S/p TNK Therapy recommendations:  pending Disposition:  pending  Hypertension Home meds:  amlodipine 10mg  daily, enalapril 10 mg daily Stable Permissive hypertension (OK if < 220/120) but gradually normalize in 5-7 days Long-term BP goal normotensive  Hyperlipidemia Home meds:  zetia 10mg  daily, not resumed in hospital LDL 52, goal < 70 Resume zetia when liver enzymes return to WNL  Diabetes type II Controlled Home meds:  lantus 20 units every night, Actos 30 mg daily, HgbA1c 6.1, goal < 7.0 CBGs Recent Labs    06/08/22 1459  GLUCAP 147*    SSI  Other Stroke Risk Factors Advanced Age >/= 23  Obesity, Body mass index is 50.44 kg/m., BMI >/= 30 associated with increased stroke risk, recommend weight loss, diet and exercise as appropriate  Family hx stroke (father)  Hospital day # 1  06/10/22, MSN, NP-C [facility] See AMION or use Epic Chat   To contact Stroke  Continuity provider, please refer to 76. After hours, contact General Neurology

## 2022-06-09 NOTE — Evaluation (Signed)
Physical Therapy Evaluation Patient Details Name: Kerri Richardson MRN: [id number] DOB: 09-23-1940 Today's Date: 06/09/2022  History of Present Illness  81 yo female presents to [facility] on 12/26 with word finding difficulty, confusion, R weakness. TNK given 1436 on 12/26. CTH negative, MRI and EEG negative. Per neuro, likely TIA. PMH includes  CKD 3, diabetes with diabetic retinopathy, hypercholesterolemia, HTN, glaucoma.  Clinical Impression   Pt presents with generalized weakness, mildly impaired balance, and decreased activity tolerance. Pt to benefit from acute PT to address deficits. Pt ambulated short distance with RW, states she uses a rollator at home to prevent falls. Anticipate pt is close to baseline, no follow up PT needs at this time. PT to progress mobility as tolerated, and will continue to follow acutely.         Recommendations for follow up therapy are one component of a multi-disciplinary discharge planning process, led by the attending physician.  Recommendations may be updated based on patient status, additional functional criteria and insurance authorization.  Follow Up Recommendations No PT follow up      Assistance Recommended at Discharge Set up Supervision/Assistance  Patient can return home with the following  A little help with walking and/or transfers;A little help with bathing/dressing/bathroom    Equipment Recommendations None recommended by PT  Recommendations for Other Services       Functional Status Assessment Patient has had a recent decline in their functional status and demonstrates the ability to make significant improvements in function in a reasonable and predictable amount of time.     Precautions / Restrictions Precautions Precautions: Fall Restrictions Weight Bearing Restrictions: No      Mobility  Bed Mobility Overal bed mobility: Needs Assistance Bed Mobility: Supine to Sit     Supine to sit: Supervision     General bed mobility  comments: increased time but no physical assist required    Transfers Overall transfer level: Needs assistance Equipment used: Rolling walker (2 wheels) Transfers: Sit to/from Stand Sit to Stand: Min guard           General transfer comment: for safety    Ambulation/Gait Ambulation/Gait assistance: Min guard Gait Distance (Feet): 30 Feet Assistive device: Rolling walker (2 wheels) Gait Pattern/deviations: Step-through pattern, Decreased stride length, Trunk flexed Gait velocity: decr     General Gait Details: close guard for safety, cues for upright posture and placement in RW. Pt also requires safety cues to continue using RW when making transitional movements (preparing to sit in recliner). DOE 2/4, SPO77min on RA 91%.  Stairs            Wheelchair Mobility    Modified Rankin (Stroke Patients Only) Modified Rankin (Stroke Patients Only) Pre-Morbid Rankin Score: Slight disability Modified Rankin: Moderate disability     Balance Overall balance assessment: Needs assistance Sitting-balance support: No upper extremity supported, Feet supported Sitting balance-Leahy Scale: Fair     Standing balance support: Bilateral upper extremity supported, No upper extremity supported, During functional activity Standing balance-Leahy Scale: Fair Standing balance comment: relies on RW dynamically                             Pertinent Vitals/Pain Pain Assessment Pain Assessment: No/denies pain Faces Pain Scale: Hurts a little bit Pain Location: chronic bil knee pain Pain Descriptors / Indicators: Discomfort Pain Intervention(s): Limited activity within patient's tolerance, Monitored during session, Repositioned    Home Living Family/patient expects to be discharged to::  Private residence Living Arrangements: Alone Available Help at Discharge: Family;Friend(s);Available PRN/intermittently Type of Home: House Home Access: Ramped entrance       Home  Layout: One level Home Equipment: Rollator (4 wheels);Shower seat;BSC/3in1 Additional Comments: family/friends check on her    Prior Function Prior Level of Function : Independent/Modified Independent;Driving             Mobility Comments: uses rollator for mobility ADLs Comments: uses BSC at night, independent ADLs/ IADLs (light)     Hand Dominance   Dominant Hand: Right    Extremity/Trunk Assessment   Upper Extremity Assessment Upper Extremity Assessment: Defer to OT evaluation    Lower Extremity Assessment Lower Extremity Assessment: Generalized weakness    Cervical / Trunk Assessment Cervical / Trunk Assessment: Other exceptions Cervical / Trunk Exceptions: increased body habitus  Communication   Communication: HOH  Cognition Arousal/Alertness: Awake/alert Behavior During Therapy: WFL for tasks assessed/performed Overall Cognitive Status: Impaired/Different from baseline Area of Impairment: Problem solving, Following commands, Memory                     Memory: Decreased short-term memory Following Commands: Follows one step commands consistently, Follows one step commands with increased time, Follows multi-step commands inconsistently     Problem Solving: Slow processing, Difficulty sequencing, Requires verbal cues General Comments: anticipate pt is close to baseline, pt's friend who is "like a niece" at bedside and states pt has trouble remembering things sometimes, has forgotten to take pills before        General Comments General comments (skin integrity, edema, etc.): pt's family friend who pt refers to as a niece is at bedside    Exercises     Assessment/Plan    PT Assessment Patient needs continued PT services  PT Problem List Decreased mobility;Decreased activity tolerance;Decreased knowledge of use of DME;Decreased balance;Pain;Cardiopulmonary status limiting activity;Decreased safety awareness       PT Treatment Interventions DME  instruction;Therapeutic activities;Gait training;Therapeutic exercise;Patient/family education;Balance training;Functional mobility training;Neuromuscular re-education    PT Goals (Current goals can be found in the Care Plan section)  Acute Rehab PT Goals Patient Stated Goal: home asap PT Goal Formulation: With patient Time For Goal Achievement: 06/23/22 Potential to Achieve Goals: Good    Frequency Min 3X/week     Co-evaluation               AM-PAC PT "6 Clicks" Mobility  Outcome Measure Help needed turning from your back to your side while in a flat bed without using bedrails?: A Little Help needed moving from lying on your back to sitting on the side of a flat bed without using bedrails?: A Little Help needed moving to and from a bed to a chair (including a wheelchair)?: A Little Help needed standing up from a chair using your arms (e.g., wheelchair or bedside chair)?: A Little Help needed to walk in hospital room?: A Little Help needed climbing 3-5 steps with a railing? : A Little 6 Click Score: 18    End of Session   Activity Tolerance: Patient tolerated treatment well Patient left: in chair;with call bell/phone within reach;with family/visitor present Nurse Communication: Mobility status PT Visit Diagnosis: Other abnormalities of gait and mobility (R26.89)    Time: 4034-7425 PT Time Calculation (min) (ACUTE ONLY): 14 min   Charges:   PT Evaluation $PT Eval Low Complexity: 1 Low         Roneisha Stern S, PT DPT Acute Rehabilitation Services Pager [phone]  Office [phone]  Eleanore Junio E Stroup 06/09/2022, 4:29 PM

## 2022-06-09 NOTE — Progress Notes (Signed)
  Echocardiogram 2D Echocardiogram has been performed.  Kerri Richardson 06/09/2022, 8:59 AM

## 2022-06-09 NOTE — Progress Notes (Signed)
OT Cancellation Note  Patient Details Name: JEMIAH CUADRA MRN: [id number] DOB: 04/18/1941   Cancelled Treatment:    Reason Eval/Treat Not Completed: Patient at procedure or test/ unavailable.  Barry Brunner, OT Acute Rehabilitation Services Office [phone]   Chancy Milroy 06/09/2022, 1:13 PM

## 2022-06-09 NOTE — Progress Notes (Signed)
During assessment, patient complaining of headache 8/10 and began dry heaving. Notified MD with orders for stat head CT.

## 2022-06-09 NOTE — Procedures (Signed)
Patient Name: Kerri Richardson  MRN: [id number]  Epilepsy Attending: Charlsie Quest  Referring Physician/Provider: Rejeana Brock, MD  Date: 06/09/2022 Duration: 22.21 mins  Patient history: 81 y.o. female past medical history of CKD 3, diabetes with diabetic retinopathy as complication, hypercholesterolemia, hypertension, presenting to the emergency room for evaluation of sudden onset of word finding difficulty. EEG to evaluate for seizure.  Level of alertness: Awake  AEDs during EEG study: None  Technical aspects: This EEG study was done with scalp electrodes positioned according to the 10-20 International system of electrode placement. Electrical activity was reviewed with band pass filter of 1-70Hz , sensitivity of 7 uV/mm, display speed of 38mm/sec with a 60Hz  notched filter applied as appropriate. EEG data were recorded continuously and digitally stored.  Video monitoring was available and reviewed as appropriate.  Description: The posterior dominant rhythm consists of 8 Hz activity of moderate voltage (25-35 uV) seen predominantly in posterior head regions, symmetric and reactive to eye opening and eye closing. Hyperventilation and photic stimulation were not performed.     IMPRESSION: This study is within normal limits. No seizures or epileptiform discharges were seen throughout the recording.  A normal interictal EEG does not exclude the diagnosis of epilepsy.  Jerene Yeager 

## 2022-06-09 NOTE — Plan of Care (Signed)

## 2022-06-09 NOTE — Progress Notes (Signed)
EEG complete - results pending 

## 2022-06-09 NOTE — Progress Notes (Addendum)
Pt NIH of 0 increased to a 4 with increased confusion and right gaze preference on 0000 hour assessment. Notified Dr. Amada Jupiter of neuro change and new verbal order placed for stat head CT.   Octavia Bruckner RN

## 2022-06-10 ENCOUNTER — Other Ambulatory Visit (HOSPITAL_COMMUNITY): Payer: Self-pay

## 2022-06-10 DIAGNOSIS — R7401 Elevation of levels of liver transaminase levels: Secondary | ICD-10-CM | POA: Diagnosis not present

## 2022-06-10 DIAGNOSIS — Z9282 Status post administration of tPA (rtPA) in a different facility within the last 24 hours prior to admission to current facility: Secondary | ICD-10-CM | POA: Diagnosis not present

## 2022-06-10 DIAGNOSIS — G459 Transient cerebral ischemic attack, unspecified: Secondary | ICD-10-CM | POA: Diagnosis not present

## 2022-06-10 LAB — GLUCOSE, CAPILLARY
Glucose-Capillary: 115 mg/dL — ABNORMAL HIGH (ref 70–99)
Glucose-Capillary: 134 mg/dL — ABNORMAL HIGH (ref 70–99)
Glucose-Capillary: 141 mg/dL — ABNORMAL HIGH (ref 70–99)
Glucose-Capillary: 156 mg/dL — ABNORMAL HIGH (ref 70–99)
Glucose-Capillary: 168 mg/dL — ABNORMAL HIGH (ref 70–99)
Glucose-Capillary: 81 mg/dL (ref 70–99)
Glucose-Capillary: 87 mg/dL (ref 70–99)

## 2022-06-10 LAB — HEMOGLOBIN A1C
Hgb A1c MFr Bld: 6.4 % — ABNORMAL HIGH (ref 4.8–5.6)
Mean Plasma Glucose: 137 mg/dL

## 2022-06-10 LAB — COMPREHENSIVE METABOLIC PANEL
ALT: 80 U/L — ABNORMAL HIGH (ref 0–44)
AST: 95 U/L — ABNORMAL HIGH (ref 15–41)
Albumin: 2.9 g/dL — ABNORMAL LOW (ref 3.5–5.0)
Alkaline Phosphatase: 198 U/L — ABNORMAL HIGH (ref 38–126)
Anion gap: 7 (ref 5–15)
BUN: 25 mg/dL — ABNORMAL HIGH (ref 8–23)
CO2: 21 mmol/L — ABNORMAL LOW (ref 22–32)
Calcium: 8.3 mg/dL — ABNORMAL LOW (ref 8.9–10.3)
Chloride: 107 mmol/L (ref 98–111)
Creatinine, Ser: 1.74 mg/dL — ABNORMAL HIGH (ref 0.44–1.00)
GFR, Estimated: 29 mL/min — ABNORMAL LOW (ref >60)
Glucose, Bld: 79 mg/dL (ref 70–99)
Potassium: 4.1 mmol/L (ref 3.5–5.1)
Sodium: 135 mmol/L (ref 135–145)
Total Bilirubin: 0.6 mg/dL (ref 0.3–1.2)
Total Protein: 5.6 g/dL — ABNORMAL LOW (ref 6.5–8.1)

## 2022-06-10 LAB — CBC
HCT: 38.1 % (ref 36.0–46.0)
Hemoglobin: 11.5 g/dL — ABNORMAL LOW (ref 12.0–15.0)
MCH: 27.7 pg (ref 26.0–34.0)
MCHC: 30.2 g/dL (ref 30.0–36.0)
MCV: 91.8 fL (ref 80.0–100.0)
Platelets: 201 10*3/uL (ref 150–400)
RBC: 4.15 MIL/uL (ref 3.87–5.11)
RDW: 15.6 % — ABNORMAL HIGH (ref 11.5–15.5)
WBC: 7.9 10*3/uL (ref 4.0–10.5)
nRBC: 0 % (ref 0.0–0.2)

## 2022-06-10 MED ORDER — ASPIRIN 81 MG PO TBEC
81.0000 mg | DELAYED_RELEASE_TABLET | Freq: Every day | ORAL | 12 refills | Status: AC
  Filled 2022-06-10: qty 30, 30d supply, fill #0

## 2022-06-10 MED ORDER — BRIMONIDINE TARTRATE 0.2 % OP SOLN
1.0000 [drp] | Freq: Two times a day (BID) | OPHTHALMIC | 12 refills | Status: AC
  Filled 2022-06-10: qty 10, 50d supply, fill #0

## 2022-06-10 MED ORDER — CLOPIDOGREL BISULFATE 75 MG PO TABS
75.0000 mg | ORAL_TABLET | Freq: Every day | ORAL | 0 refills | Status: AC
  Filled 2022-06-10: qty 30, 30d supply, fill #0

## 2022-06-10 MED ORDER — AMLODIPINE BESYLATE 10 MG PO TABS
10.0000 mg | ORAL_TABLET | Freq: Every day | ORAL | Status: DC
  Administered 2022-06-10 – 2022-06-11 (×2): 10 mg via ORAL
  Filled 2022-06-10 (×2): qty 1

## 2022-06-10 MED ORDER — ALLOPURINOL 100 MG PO TABS
100.0000 mg | ORAL_TABLET | Freq: Two times a day (BID) | ORAL | 0 refills | Status: AC
  Filled 2022-06-10: qty 60, 30d supply, fill #0

## 2022-06-10 MED ORDER — LATANOPROST 0.005 % OP SOLN
1.0000 [drp] | Freq: Every day | OPHTHALMIC | 12 refills | Status: AC
  Filled 2022-06-10: qty 2.5, 25d supply, fill #0

## 2022-06-10 MED ORDER — METOPROLOL TARTRATE 50 MG PO TABS
50.0000 mg | ORAL_TABLET | Freq: Two times a day (BID) | ORAL | 0 refills | Status: AC
  Filled 2022-06-10: qty 60, 30d supply, fill #0

## 2022-06-10 NOTE — TOC Transition Note (Addendum)
Transition of Care [facility]) - CM/SW Discharge Note   Patient Details  Name: Kerri Richardson MRN: [id number] Date of Birth: 10-13-1940  [facility] CM/SW Contact:  Kermit Balo, RN Phone Number: 06/10/2022, 2:19 PM   Clinical Narrative:    PCP: [facility] Pt is discharging home with home health services through [facility]. Information on the AVS. Pt provided choice for HH via medicare. Gov. Pt has cane/ walker/ ramp/ shower seat at home.  Pt has been driving self as needed.  She manages her own medications and denies any issues. CM has asked her friend, Lynden Ang (with pt permission) to assist with her medications at home. Cathy agreed.  Lynden Ang to provide transportation home today.   Final next level of care: Home w Home Health Services Barriers to Discharge: No Barriers Identified   Patient Goals and CMS Choice CMS Medicare.gov Compare Post Acute Care list provided to:: Patient Choice offered to / list presented to : Patient  Discharge Placement                         Discharge Plan and Services Additional resources added to the After Visit Summary for                            [facility] Arranged: PT, OT [facility] Agency: [facility] Date [facility] Agency Contacted: 06/10/22   Representative spoke with at [facility] Agency: Kandee Keen  Social Determinants of Health (SDOH) Interventions SDOH Screenings   Food Insecurity: No Food Insecurity (06/09/2022)  Housing: Low Risk  (06/09/2022)  Transportation Needs: No Transportation Needs (06/09/2022)  Utilities: Not At Risk (06/09/2022)  Depression (PHQ2-9): Low Risk  (07/13/2021)  Tobacco Use: Medium Risk (06/09/2022)     Readmission Risk Interventions     No data to display

## 2022-06-10 NOTE — Discharge Planning (Deleted)
Stroke Discharge Summary  Patient ID: Kerri Richardson   MRN: [id number]      DOB: 1940-11-19  Date of Admission: 06/08/2022 Date of Discharge: 06/10/2022  Attending Physician:  Stroke, Md, MD, Stroke MD Consultant(s):    None  Patient's PCP:  Default, Provider, MD  DISCHARGE DIAGNOSIS:  Principal Problem:   TIA (transient ischemic attack) Active Problems:   DM (diabetes mellitus), type 2 ([place])   Essential hypertension   CKD (chronic kidney disease), stage III ([place])   Mixed hyperlipidemia   Morbid obesity with body mass index of 45.0-49.9 in adult [facility])   Transaminitis   Allergies as of 06/10/2022   No Known Allergies      Medication List     STOP taking these medications    aspirin 81 MG tablet Replaced by: aspirin EC 81 MG tablet   atorvastatin 80 MG tablet Commonly known as: LIPITOR   dorzolamide-timolol 2-0.5 % ophthalmic solution Commonly known as: COSOPT   ezetimibe 10 MG tablet Commonly known as: ZETIA       TAKE these medications    Accu-Chek Aviva Plus test strip Generic drug: glucose blood TEST BLOOD SUGAR TWICE DAILY   Accu-Chek Nano SmartView w/Device Kit 1 each by Does not apply route 2 (two) times daily. BID   Accu-Chek Softclix Lancets lancets TEST BLOOD SUGAR TWICE DAILY   acetaminophen 325 MG tablet Commonly known as: TYLENOL Take 650 mg by mouth every 6 (six) hours as needed for mild pain or moderate pain.   allopurinol 100 MG tablet Commonly known as: ZYLOPRIM Take 1 tablet (100 mg total) by mouth 2 (two) times daily.   amLODipine 10 MG tablet Commonly known as: NORVASC Take 1 tablet (10 mg total) by mouth daily.   aspirin EC 81 MG tablet Take 1 tablet (81 mg total) by mouth daily. Swallow whole. Start taking on: June 11, 2022 Replaces: aspirin 81 MG tablet   brimonidine 0.2 % ophthalmic solution Commonly known as: ALPHAGAN Place 1 drop into both eyes 2 (two) times daily.   clopidogrel 75 MG tablet Commonly  known as: PLAVIX Take 1 tablet (75 mg total) by mouth daily. Start taking on: June 11, 2022   Droplet Insulin Syringe 31G X 5/16" 0.5 ML Misc Generic drug: Insulin Syringe-Needle U-100 USE EVERY DAY   enalapril 10 MG tablet Commonly known as: VASOTEC TAKE 1 TABLET EVERY DAY   furosemide 20 MG tablet Commonly known as: LASIX Take 10 mg by mouth See admin instructions. Take one tablet by mouth every Monday Wednesday and Fridays   insulin glargine 100 UNIT/ML injection Commonly known as: Lantus INJECT 20 UNITS EVERY NIGHT AT BEDTIME (DISCARD OPEN VIAL 28 DAYS AFTER FIRST OPEN) What changed:  how much to take how to take this when to take this additional instructions   latanoprost 0.005 % ophthalmic solution Commonly known as: XALATAN Place 1 drop into both eyes at bedtime.   metoprolol tartrate 50 MG tablet Commonly known as: LOPRESSOR Take 1 tablet (50 mg total) by mouth 2 (two) times daily.   omeprazole 40 MG capsule Commonly known as: PRILOSEC Take 40 mg by mouth daily. 20 min. before breakfast   pioglitazone 30 MG tablet Commonly known as: ACTOS TAKE 1 TABLET EVERY DAY        LABORATORY STUDIES CBC    Component Value Date/Time   WBC 7.9 06/10/2022 0121   RBC 4.15 06/10/2022 0121   HGB 11.5 (L) 06/10/2022 0121   HGB  13.5 07/07/2020 1556   HCT 38.1 06/10/2022 0121   HCT 43.4 07/07/2020 1556   PLT 201 06/10/2022 0121   PLT 199 07/07/2020 1556   MCV 91.8 06/10/2022 0121   MCV 89 07/07/2020 1556   MCH 27.7 06/10/2022 0121   MCHC 30.2 06/10/2022 0121   RDW 15.6 (H) 06/10/2022 0121   RDW 13.8 07/07/2020 1556   LYMPHSABS 1.6 06/08/2022 1630   LYMPHSABS 1.5 07/07/2020 1556   MONOABS 0.6 06/08/2022 1630   EOSABS 0.2 06/08/2022 1630   EOSABS 0.2 07/07/2020 1556   BASOSABS 0.1 06/08/2022 1630   BASOSABS 0.1 07/07/2020 1556   CMP    Component Value Date/Time   NA 135 06/10/2022 0121   NA 137 02/18/2021 1009   K 4.1 06/10/2022 0121   CL 107  06/10/2022 0121   CO2 21 (L) 06/10/2022 0121   GLUCOSE 79 06/10/2022 0121   BUN 25 (H) 06/10/2022 0121   BUN 42 (H) 02/18/2021 1009   CREATININE 1.74 (H) 06/10/2022 0121   CREATININE 1.64 (H) 05/16/2017 1040   CALCIUM 8.3 (L) 06/10/2022 0121   PROT 5.6 (L) 06/10/2022 0121   PROT 6.2 07/13/2021 1444   ALBUMIN 2.9 (L) 06/10/2022 0121   ALBUMIN 4.0 07/13/2021 1444   AST 95 (H) 06/10/2022 0121   ALT 80 (H) 06/10/2022 0121   ALKPHOS 198 (H) 06/10/2022 0121   BILITOT 0.6 06/10/2022 0121   BILITOT 0.4 07/13/2021 1444   GFRNONAA 29 (L) 06/10/2022 0121   GFRAA 31 (L) 07/07/2020 1556   COAGS Lab Results  Component Value Date   INR 1.1 06/08/2022   Lipid Panel    Component Value Date/Time   CHOL 110 06/09/2022 0538   CHOL 120 07/13/2021 1444   TRIG 90 06/09/2022 0538   HDL 40 (L) 06/09/2022 0538   HDL 41 07/13/2021 1444   CHOLHDL 2.8 06/09/2022 0538   VLDL 18 06/09/2022 0538   LDLCALC 52 06/09/2022 0538   LDLCALC 59 07/13/2021 1444   LDLCALC 122 (H) 05/16/2017 1040   HgbA1C  Lab Results  Component Value Date   HGBA1C 6.4 (H) 06/08/2022   Urinalysis    Component Value Date/Time   LABSPEC 1.020 05/18/2018 1019   BILIRUBINUR negative 05/18/2018 1019   BILIRUBINUR neg 12/10/2013 0856   KETONESUR negative 05/18/2018 1019   PROTEINUR negative 05/18/2018 1019   PROTEINUR neg 12/10/2013 0856   UROBILINOGEN negative 12/10/2013 0856   NITRITE Negative 05/18/2018 1019   NITRITE neg 12/10/2013 0856   LEUKOCYTESUR Negative 05/18/2018 1019   Urine Drug Screen     Component Value Date/Time   LABOPIA NONE DETECTED 06/09/2022 1030   COCAINSCRNUR NONE DETECTED 06/09/2022 1030   LABBENZ NONE DETECTED 06/09/2022 1030   AMPHETMU NONE DETECTED 06/09/2022 1030   THCU NONE DETECTED 06/09/2022 1030   LABBARB NONE DETECTED 06/09/2022 1030    Alcohol Level    Component Value Date/Time   ETH <10 06/08/2022 1630     SIGNIFICANT DIAGNOSTIC STUDIES MR BRAIN WO CONTRAST  Result  Date: 06/09/2022 CLINICAL DATA:  Neuro deficit, acute, stroke suspected. Right-sided weakness, confusion, and aphasia. EXAM: MRI HEAD WITHOUT CONTRAST MRA HEAD WITHOUT CONTRAST TECHNIQUE: Multiplanar, multi-echo pulse sequences of the brain and surrounding structures were acquired without intravenous contrast. Angiographic images of the Circle of Willis were acquired using MRA technique without intravenous contrast. COMPARISON:  Head CT 06/09/2022 FINDINGS: MRI HEAD FINDINGS Brain: There is no evidence of an acute infarct, intracranial hemorrhage, mass, midline shift, or extra-axial fluid collection. A  punctate focus of T2 shine through is noted in the anterior right frontal white matter on diffusion imaging. Dural thickening and calcification are noted along the falx. Confluent T2 hyperintensities in the cerebral white matter bilaterally are nonspecific but compatible with severe chronic small vessel ischemic disease. Small chronic infarcts are noted in the right corona radiata and bilateral basal ganglia. There is also a small chronic posterior right parietal cortical infarct. There is mild cerebral atrophy. An enlarged partially empty sella is noted. Vascular: Major intracranial vascular flow voids are preserved. Skull and upper cervical spine: Unremarkable bone marrow signal. Sinuses/Orbits: Bilateral cataract extraction. Mild mucosal thickening in the maxillary sinuses. Bilateral mastoid and middle ear effusions. Other: None. MRA HEAD FINDINGS Anterior circulation: The internal carotid arteries are widely patent from skull base to carotid termini. ACAs and MCAs are patent without evidence of a proximal branch occlusion or significant proximal stenosis. No aneurysm is identified. Posterior circulation: The intracranial vertebral arteries are widely patent to the basilar with the left being slightly dominant. Patent right PICA, left AICA, and bilateral SCA origins are visualized. The basilar artery is widely  patent. There are right larger than left posterior communicating arteries with hypoplasia of the right P1 segment. Both PCAs are patent without evidence of a significant proximal stenosis. No aneurysm is identified. Anatomic variants: Fetal right PCA. IMPRESSION: 1. No acute intracranial abnormality. 2. Severe chronic small vessel ischemic disease. 3. Negative head MRA. Electronically Signed   By: Logan Bores M.D.   On: 06/09/2022 13:39   MR ANGIO HEAD WO CONTRAST  Result Date: 06/09/2022 CLINICAL DATA:  Neuro deficit, acute, stroke suspected. Right-sided weakness, confusion, and aphasia. EXAM: MRI HEAD WITHOUT CONTRAST MRA HEAD WITHOUT CONTRAST TECHNIQUE: Multiplanar, multi-echo pulse sequences of the brain and surrounding structures were acquired without intravenous contrast. Angiographic images of the Circle of Willis were acquired using MRA technique without intravenous contrast. COMPARISON:  Head CT 06/09/2022 FINDINGS: MRI HEAD FINDINGS Brain: There is no evidence of an acute infarct, intracranial hemorrhage, mass, midline shift, or extra-axial fluid collection. A punctate focus of T2 shine through is noted in the anterior right frontal white matter on diffusion imaging. Dural thickening and calcification are noted along the falx. Confluent T2 hyperintensities in the cerebral white matter bilaterally are nonspecific but compatible with severe chronic small vessel ischemic disease. Small chronic infarcts are noted in the right corona radiata and bilateral basal ganglia. There is also a small chronic posterior right parietal cortical infarct. There is mild cerebral atrophy. An enlarged partially empty sella is noted. Vascular: Major intracranial vascular flow voids are preserved. Skull and upper cervical spine: Unremarkable bone marrow signal. Sinuses/Orbits: Bilateral cataract extraction. Mild mucosal thickening in the maxillary sinuses. Bilateral mastoid and middle ear effusions. Other: None. MRA HEAD  FINDINGS Anterior circulation: The internal carotid arteries are widely patent from skull base to carotid termini. ACAs and MCAs are patent without evidence of a proximal branch occlusion or significant proximal stenosis. No aneurysm is identified. Posterior circulation: The intracranial vertebral arteries are widely patent to the basilar with the left being slightly dominant. Patent right PICA, left AICA, and bilateral SCA origins are visualized. The basilar artery is widely patent. There are right larger than left posterior communicating arteries with hypoplasia of the right P1 segment. Both PCAs are patent without evidence of a significant proximal stenosis. No aneurysm is identified. Anatomic variants: Fetal right PCA. IMPRESSION: 1. No acute intracranial abnormality. 2. Severe chronic small vessel ischemic disease. 3. Negative head MRA.  Electronically Signed   By: Logan Bores M.D.   On: 06/09/2022 13:39   ECHOCARDIOGRAM COMPLETE  Result Date: 06/09/2022    ECHOCARDIOGRAM REPORT   Patient Name:   Kerri Richardson Date of Exam: 06/09/2022 Medical Rec #:  [id number]        Height:       62.0 in Accession #:    [id number]       Weight:       275.8 lb Date of Birth:  06-02-1941        BSA:          2.191 m Patient Age:    76 years         BP:           136/64 mmHg Patient Gender: F                HR:           68 bpm. Exam Location:  Inpatient Procedure: 2D Echo, Color Doppler and Cardiac Doppler Indications:    Stroke  History:        Patient has prior history of Echocardiogram examinations, most                 recent 04/15/2021. Risk Factors:Diabetes and Hypertension. CKD.  Sonographer:    Eartha Inch Referring Phys: Amie Portland  Sonographer Comments: Technically difficult study due to poor echo windows and patient is obese. Image acquisition challenging due to patient body habitus and Image acquisition challenging due to respiratory motion. IMPRESSIONS  1. Left ventricular ejection fraction, by  estimation, is 65 to 70%. The left ventricle has normal function. The left ventricle has no regional wall motion abnormalities. Left ventricular diastolic parameters are consistent with Grade I diastolic dysfunction (impaired relaxation).  2. Right ventricular systolic function is normal. The right ventricular size is normal.  3. The mitral valve is grossly normal. Trivial mitral valve regurgitation.  4. The aortic valve is tricuspid. There is mild calcification of the aortic valve. There is mild thickening of the aortic valve. Aortic valve regurgitation is not visualized. Aortic valve sclerosis/calcification is present, without any evidence of aortic stenosis.  5. The inferior vena cava is normal in size with greater than 50% respiratory variability, suggesting right atrial pressure of 3 mmHg. Comparison(s): No significant change from prior study. Conclusion(s)/Recommendation(s): No intracardiac source of embolism detected on this transthoracic study. Consider a transesophageal echocardiogram to exclude cardiac source of embolism if clinically indicated. FINDINGS  Left Ventricle: Left ventricular ejection fraction, by estimation, is 65 to 70%. The left ventricle has normal function. The left ventricle has no regional wall motion abnormalities. The left ventricular internal cavity size was normal in size. There is  no left ventricular hypertrophy. Left ventricular diastolic parameters are consistent with Grade I diastolic dysfunction (impaired relaxation). Right Ventricle: The right ventricular size is normal. No increase in right ventricular wall thickness. Right ventricular systolic function is normal. Left Atrium: Left atrial size was normal in size. Right Atrium: Right atrial size was normal in size. Pericardium: There is no evidence of pericardial effusion. Mitral Valve: The mitral valve is grossly normal. Trivial mitral valve regurgitation. MV peak gradient, 6.7 mmHg. The mean mitral valve gradient is 3.0  mmHg. Tricuspid Valve: The tricuspid valve is normal in structure. Tricuspid valve regurgitation is not demonstrated. Aortic Valve: The aortic valve is tricuspid. There is mild calcification of the aortic valve. There is mild thickening of the aortic valve. Aortic valve regurgitation is  not visualized. Aortic valve sclerosis/calcification is present, without any evidence of aortic stenosis. Aortic valve mean gradient measures 8.0 mmHg. Aortic valve peak gradient measures 14.0 mmHg. Pulmonic Valve: The pulmonic valve was normal in structure. Pulmonic valve regurgitation is trivial. Aorta: The aortic root is normal in size and structure. Venous: The inferior vena cava is normal in size with greater than 50% respiratory variability, suggesting right atrial pressure of 3 mmHg. IAS/Shunts: The atrial septum is grossly normal.  LEFT VENTRICLE PLAX 2D LVIDd:         4.90 cm     Diastology LVIDs:         2.90 cm     LV e' medial:    4.46 cm/s LV PW:         1.10 cm     LV E/e' medial:  21.6 LV IVS:        1.00 cm     LV e' lateral:   6.42 cm/s                            LV E/e' lateral: 15.0  LV Volumes (MOD) LV vol d, MOD A2C: 81.0 ml LV vol d, MOD A4C: 79.4 ml LV vol s, MOD A2C: 28.8 ml LV vol s, MOD A4C: 24.8 ml LV SV MOD A2C:     52.2 ml LV SV MOD A4C:     79.4 ml LV SV MOD BP:      53.4 ml RIGHT VENTRICLE             IVC RV S prime:     11.20 cm/s  IVC diam: 1.10 cm TAPSE (M-mode): 1.6 cm LEFT ATRIUM             Index        RIGHT ATRIUM           Index LA diam:        4.40 cm 2.01 cm/m   RA Area:     15.30 cm LA Vol (A2C):   63.2 ml 28.84 ml/m  RA Volume:   37.30 ml  17.02 ml/m LA Vol (A4C):   68.8 ml 31.39 ml/m LA Biplane Vol: 71.4 ml 32.58 ml/m  AORTIC VALVE AV Vmax:           187.00 cm/s AV Vmean:          138.000 cm/s AV VTI:            0.401 m AV Peak Grad:      14.0 mmHg AV Mean Grad:      8.0 mmHg LVOT Vmax:         151.00 cm/s LVOT Vmean:        101.000 cm/s LVOT VTI:          0.334 m LVOT/AV VTI ratio:  0.83  AORTA Ao Asc diam: 3.00 cm MITRAL VALVE MV Area (PHT): 2.19 cm     SHUNTS MV Peak grad:  6.7 mmHg     Systemic VTI: 0.33 m MV Mean grad:  3.0 mmHg MV Vmax:       1.29 m/s MV Vmean:      75.5 cm/s MV Decel Time: 346 msec MV E velocity: 96.20 cm/s MV A velocity: 123.00 cm/s MV E/A ratio:  0.78 Gwyndolyn Kaufman MD Electronically signed by Gwyndolyn Kaufman MD Signature Date/Time: 06/09/2022/12:20:46 PM    Final    EEG adult  Result Date: 06/09/2022 Lora Havens,  MD     06/09/2022  8:31 AM Patient Name: Kerri Richardson MRN: [id number] Epilepsy Attending: Lora Havens Referring Physician/Provider: Greta Doom, MD Date: 06/09/2022 Duration: 22.21 mins Patient history: 81 y.o. female past medical history of CKD 3, diabetes with diabetic retinopathy as complication, hypercholesterolemia, hypertension, presenting to the emergency room for evaluation of sudden onset of word finding difficulty. EEG to evaluate for seizure. Level of alertness: Awake AEDs during EEG study: None Technical aspects: This EEG study was done with scalp electrodes positioned according to the 10-20 International system of electrode placement. Electrical activity was reviewed with band pass filter of 1-_0 , sensitivity of 7 uV/mm, display speed of 61m/sec with a _1  notched filter applied as appropriate. EEG data were recorded continuously and digitally stored.  Video monitoring was available and reviewed as appropriate. Description: The posterior dominant rhythm consists of 8 Hz activity of moderate voltage (25-35 uV) seen predominantly in posterior head regions, symmetric and reactive to eye opening and eye closing. Hyperventilation and photic stimulation were not performed.   IMPRESSION: This study is within normal limits. No seizures or epileptiform discharges were seen throughout the recording. A normal interictal EEG does not exclude the diagnosis of epilepsy. [place]  CT HEAD WO CONTRAST  (5MM)  Result Date: 06/09/2022 CLINICAL DATA:  81year old female with acute neurologic deficit. EXAM: CT HEAD WITHOUT CONTRAST TECHNIQUE: Contiguous axial images were obtained from the base of the skull through the vertex without intravenous contrast. RADIATION DOSE REDUCTION: This exam was performed according to the departmental dose-optimization program which includes automated exposure control, adjustment of the mA and/or kV according to patient size and/or use of iterative reconstruction technique. COMPARISON:  Head CT 0200 hours today, and earlier. CTA head and neck yesterday. FINDINGS: Brain: Cerebral volume is within normal limits for age. No midline shift, mass effect, or evidence of intracranial mass lesion. No ventriculomegaly. No acute intracranial hemorrhage identified. Stable confluent bilateral cerebral white matter hypodensity, asymmetric involvement of the right corona radiata, since 06/08/2022. Stable gray-white matter differentiation throughout the brain. Vascular: Calcified atherosclerosis at the skull base. Skull: No acute osseous abnormality identified. Sinuses/Orbits: Right greater than left tympanic cavity and mastoid opacification is unchanged. Paranasal sinuses are stable and well aerated. Other: No acute orbit or scalp soft tissue finding. IMPRESSION: 1. Continued stable non contrast CT appearance of the brain since 06/08/2022. Advanced white matter disease most pronounced in the right corona radiata. 2. Right > left tympanic cavity and mastoid opacification. Electronically Signed   By: HGenevie AnnM.D.   On: 06/09/2022 08:06   CT HEAD WO CONTRAST (5MM)  Result Date: 06/09/2022 CLINICAL DATA:  Initial evaluation for mental status change, unknown cause. EXAM: CT HEAD WITHOUT CONTRAST TECHNIQUE: Contiguous axial images were obtained from the base of the skull through the vertex without intravenous contrast. RADIATION DOSE REDUCTION: This exam was performed according to the departmental  dose-optimization program which includes automated exposure control, adjustment of the mA and/or kV according to patient size and/or use of iterative reconstruction technique. COMPARISON:  Prior CT from 06/08/2022 and earlier studies. FINDINGS: Brain: Examination mildly degraded by motion. Atrophy with chronic small vessel ischemic disease. Remote lacunar infarcts about the right greater than left basal ganglia. No acute intracranial hemorrhage. No other acute large vessel territory infarct. No mass lesion, mass effect or midline shift. No hydrocephalus or extra-axial fluid collection. Vascular: Some residual contrast material remains on board. Calcified atherosclerosis present about the skull base. Skull: Remote parietal  burr hole craniotomy is noted. No scalp soft tissue abnormality. Sinuses/Orbits: Globes orbital soft tissues demonstrate no acute finding. Mild mucosal thickening noted about the maxillary sinuses. Paranasal sinuses are otherwise largely clear. Bilateral mastoid and middle ear effusions again noted. Other: None. IMPRESSION: 1. Stable head CT.  No acute intracranial abnormality. 2. Atrophy with chronic small vessel ischemic disease with remote lacunar infarcts about the right greater than left basal ganglia. Electronically Signed   By: Jeannine Boga M.D.   On: 06/09/2022 03:15   CT HEAD WO CONTRAST (5MM)  Result Date: 06/08/2022 CLINICAL DATA:  Initial evaluation for sudden severe headache. EXAM: CT HEAD WITHOUT CONTRAST TECHNIQUE: Contiguous axial images were obtained from the base of the skull through the vertex without intravenous contrast. RADIATION DOSE REDUCTION: This exam was performed according to the departmental dose-optimization program which includes automated exposure control, adjustment of the mA and/or kV according to patient size and/or use of iterative reconstruction technique. COMPARISON:  Prior CTs from earlier the same day. FINDINGS: Brain: Residual contrast material  on board from prior CTA. Atrophy with chronic small vessel ischemic disease. Remote lacunar infarcts about the right greater than left basal ganglia. No acute intracranial hemorrhage. No visible evolving or acute large vessel territory infarct. No mass lesion, midline shift or mass effect. No hydrocephalus or extra-axial fluid collection. Vascular: Contrast material throughout the intracranial vasculature. Scattered vascular calcifications noted within the carotid siphons. Skull: Scalp soft tissues demonstrate no acute finding. Remote biparietal burr holes noted. Sinuses/Orbits: Globes and orbital soft tissues demonstrate no acute finding. Mild mucosal thickening about the maxillary sinuses. Paranasal sinuses are otherwise largely clear. Bilateral mastoid and middle ear effusions again noted. Other: None. IMPRESSION: 1. No acute intracranial abnormality. 2. Atrophy with chronic small vessel ischemic disease, with remote lacunar infarcts about the right greater than left basal ganglia. Electronically Signed   By: Jeannine Boga M.D.   On: 06/08/2022 21:03   CT ANGIO HEAD NECK W WO CM (CODE STROKE)  Result Date: 06/08/2022 CLINICAL DATA:  Neuro deficit, acute, stroke suspected. Right-sided weakness. Right-sided facial droop. EXAM: CT ANGIOGRAPHY HEAD AND NECK TECHNIQUE: Multidetector CT imaging of the head and neck was performed using the standard protocol during bolus administration of intravenous contrast. Multiplanar CT image reconstructions and MIPs were obtained to evaluate the vascular anatomy. Carotid stenosis measurements (when applicable) are obtained utilizing NASCET criteria, using the distal internal carotid diameter as the denominator. RADIATION DOSE REDUCTION: This exam was performed according to the departmental dose-optimization program which includes automated exposure control, adjustment of the mA and/or kV according to patient size and/or use of iterative reconstruction technique.  CONTRAST:  85m OMNIPAQUE IOHEXOL 350 MG/ML SOLN COMPARISON:  Head CT immediately prior FINDINGS: CTA NECK FINDINGS Aortic arch: Aortic atherosclerosis. Branching pattern is normal without origin stenosis. Right carotid system: Common carotid artery widely patent to the bifurcation. Carotid bifurcation is normal without soft or calcified plaque. Cervical ICA is tortuous but widely patent to the skull base. Left carotid system: Common carotid artery widely patent to the bifurcation. Mild calcified plaque at the carotid bifurcation and ICA bulb. Minimal diameter of the ICA bulb is 3.5 mm. Compared to a more distal cervical ICA diameter of 5 mm, this indicates a 30% stenosis. Cervical ICA is tortuous beyond that but widely patent to the skull base. Vertebral arteries: Both vertebral artery origins are normal. Both vertebral arteries are normal through the cervical region to the foramen magnum. Skeleton: Ordinary cervical spondylosis. Other neck: No mass  or lymphadenopathy. Upper chest: Normal Review of the MIP images confirms the above findings CTA HEAD FINDINGS Anterior circulation: Both internal carotid arteries are patent through the skull base and siphon regions. Ordinary siphon atherosclerotic calcification but no stenosis greater than 30%. The anterior and middle cerebral vessels are patent. No large vessel occlusion or proximal stenosis. No aneurysm or vascular malformation. Posterior circulation: Both vertebral arteries patent through the foramen magnum to the basilar artery. No basilar stenosis. Posterior circulation branch vessels are normal. Venous sinuses: Patent and normal. Anatomic variants: None significant. Review of the MIP images confirms the above findings IMPRESSION: 1. No intracranial large vessel occlusion or proximal stenosis. 2. Aortic atherosclerosis. 3. Atherosclerotic change at the left carotid bifurcation and ICA bulb. 30% stenosis of the ICA bulb. Aortic Atherosclerosis (ICD10-I70.0).  Electronically Signed   By: Nelson Chimes M.D.   On: 06/08/2022 15:06   CT HEAD CODE STROKE WO CONTRAST  Result Date: 06/08/2022 CLINICAL DATA:  Code stroke. Neuro deficit, acute, stroke suspected. Right-sided weakness and right facial droop. EXAM: CT HEAD WITHOUT CONTRAST TECHNIQUE: Contiguous axial images were obtained from the base of the skull through the vertex without intravenous contrast. RADIATION DOSE REDUCTION: This exam was performed according to the departmental dose-optimization program which includes automated exposure control, adjustment of the mA and/or kV according to patient size and/or use of iterative reconstruction technique. COMPARISON:  None Available. FINDINGS: Brain: Generalized brain volume loss. Chronic small-vessel ischemic changes affect the cerebral hemispheric white matter. No focal posterior fossa insult. Bilateral parietal/occipital burr holes. More focal area likely chronic white matter ischemic changes noted in the right posterior frontal region. No finding to suggest acute infarction, mass lesion, hemorrhage, hydrocephalus or extra-axial collection presently. Vascular: There is atherosclerotic calcification of the major vessels at the base of the brain. Skull: Negative other than the bilateral parietal/occipital burr holes. Sinuses/Orbits: Sinuses are clear. Orbits are negative. Fluid in the mastoid air cells, right more than left. Some fluid in both middle ears as well. Other: None ASPECTS ([place] Stroke Program Early CT Score) - Ganglionic level infarction (caudate, lentiform nuclei, internal capsule, insula, M1-M3 cortex): 7 - Supraganglionic infarction (M4-M6 cortex): 3 Total score (0-10 with 10 being normal): 10 IMPRESSION: 1. No acute CT finding. Chronic small-vessel ischemic changes of the cerebral hemispheric white matter. Bilateral parietal/occipital burr holes. 2. Aspects is 10. 3. Fluid in the mastoid air cells, right more than left. Some fluid in both middle ears  as well. These results were communicated to Dr. Rory Percy at 232 pm on 06/08/2022 by text page via the [facility] messaging system. Electronically Signed   By: Nelson Chimes M.D.   On: 06/08/2022 14:35      HISTORY OF PRESENT ILLNESS  81 year old female with history of diabetes, hypertension, hyperlipidemia, diabetic retinopathy, CKD 3 admitted for right-sided weakness, confusion and speech difficulty. She received IV TNK   HOSPITAL COURSE Likely TIA given symptoms improving prior to TNK; but TNK given d/t disabling symptoms.   CT head Stable head CT.  No acute intracranial abnormality. 2. Atrophy with chronic small vessel ischemic disease with remote lacunar infarcts about the right greater than left basal ganglia. MRI  no abnormality 2d Echo EF 65-70%. Left ventricular diastolic parameters are  consistent with Grade I diastolic dysfunction  LDL 52 HgbA1c 6.1 EEG: no seizures or epileptiform discharges seen.  aspirin 81 mg daily prior to admission, now on ASA 81 mg and Plavix 44m for 3 weeks than Plavix alone  Needs out patient  follow up with [facility] in 2 months after discharge   Hypertension Home meds:  amlodipine 49m daily, enalapril 10 mg daily Stable Long-term BP goal normotensive   Hyperlipidemia Home meds:  zetia 166mdailyand atorvastatin, not resumed in hospital LDL 52, goal < 70 Resume zetia when liver enzymes return to WNL Will need follow up labs with PCP  Diabetes type II Controlled Home meds:  lantus 20 units every night, Actos 30 mg daily, HgbA1c 6.1, goal < 7.0  ther Stroke Risk Factors Advanced Age >/= 6535Obesity, Body mass index is 50.44 kg/m., BMI >/= 30 associated with increased stroke risk, recommend weight loss, diet and exercise as appropriate  Family hx stroke (father)  Recommendations for patient are Home health PT/OT  and a referral has been sent  Patient was deemed safe and appropriate for discharge to home. Patient and family were updated and all questions  were answered and voiced understanding   DISCHARGE EXAM Blood pressure 122/62, pulse 61, temperature 98.2 F (36.8 C), temperature source Oral, resp. rate 17, height 5' 2.01" (1.575 m), weight 125 kg, last menstrual period 06/14/1998, SpO2 98 %.  PHYSICAL EXAM General: Awake alert in no distress HEENT: Normocephalic/atraumatic cardiovascular: Regular rhythm Abdomen nondistended nontender Extremities warm well-perfused   Neurological exam She is awake alert oriented to self She was able to tell me her correct age, named thumb, fist appropriately. Repetition was intact Comprehension for simple commands was intact but multistep commands were difficult to follow for her. Cranial nerves: Pupils equal round react light, extraocular movements intact, visual fields full, face appears to have a very subtle right lower facial weakness, tongue and palate midline. Motor examination with no drift in the upper extremities or lower extremities. Sensation: Intact without extinction Coordination with no dysmetria  Discharge Diet       Diet   Diet Carb Modified Fluid consistency: Thin; Room service appropriate? Yes   liquids  DISCHARGE PLAN Disposition:  home with Home health PT and OT  aspirin 81 mg daily and clopidogrel 75 mg daily for secondary stroke prevention for 3 weeks then Plavix alone. Ongoing stroke risk factor control by Primary Care Physician at time of discharge Follow-up PCP Default, Provider, MD in 2 weeks.Referral has been sent for establishment.  Liver enzymes should be rechecked in 14 days and consideration of restarting anti lipid medications  Follow-up in [facility] in 8 weeks, office to schedule an appointment.   45 minutes were spent preparing discharge.  DeBeulah GandyNP, ACNPC-AG  [facility]

## 2022-06-10 NOTE — Discharge Summary (Addendum)
Stroke Discharge Summary  Patient ID: Kerri Richardson   MRN: [id number]      DOB: [birth year]  Date of Admission: 06/08/2022 Date of Discharge: 06/10/2022  Attending Physician:  Stroke, Md, MD, Stroke MD Consultant(s):    None  Patient's PCP:  Default, Provider, MD  DISCHARGE DIAGNOSIS:  Principal Problem: Stroke like symptoms s/p TNK TIA (transient ischemic attack)  Active Problems:   DM (diabetes mellitus), type 2 ([place])   Essential hypertension   CKD (chronic kidney disease), stage III ([place])   Mixed hyperlipidemia   Morbid obesity with body mass index of 45.0-49.9 in adult [facility])   Transaminitis   Allergies as of 06/10/2022   No Known Allergies      Medication List     STOP taking these medications    aspirin 81 MG tablet Replaced by: aspirin EC 81 MG tablet   atorvastatin 80 MG tablet Commonly known as: LIPITOR   dorzolamide-timolol 2-0.5 % ophthalmic solution Commonly known as: COSOPT   ezetimibe 10 MG tablet Commonly known as: ZETIA       TAKE these medications    Accu-Chek Aviva Plus test strip Generic drug: glucose blood TEST BLOOD SUGAR TWICE DAILY   Accu-Chek Nano SmartView w/Device Kit 1 each by Does not apply route 2 (two) times daily. BID   Accu-Chek Softclix Lancets lancets TEST BLOOD SUGAR TWICE DAILY   acetaminophen 325 MG tablet Commonly known as: TYLENOL Take 650 mg by mouth every 6 (six) hours as needed for mild pain or moderate pain.   allopurinol 100 MG tablet Commonly known as: ZYLOPRIM Take 1 tablet (100 mg total) by mouth 2 (two) times daily.   amLODipine 10 MG tablet Commonly known as: NORVASC Take 1 tablet (10 mg total) by mouth daily.   aspirin EC 81 MG tablet Take 1 tablet (81 mg total) by mouth daily. Swallow whole. Start taking on: June 11, 2022 Replaces: aspirin 81 MG tablet   brimonidine 0.2 % ophthalmic solution Commonly known as: ALPHAGAN Place 1 drop into both eyes 2 (two) times daily.    clopidogrel 75 MG tablet Commonly known as: PLAVIX Take 1 tablet (75 mg total) by mouth daily. Start taking on: June 11, 2022   Droplet Insulin Syringe 31G X 5/16" 0.5 ML Misc Generic drug: Insulin Syringe-Needle U-100 USE EVERY DAY   enalapril 10 MG tablet Commonly known as: VASOTEC TAKE 1 TABLET EVERY DAY   furosemide 20 MG tablet Commonly known as: LASIX Take 10 mg by mouth See admin instructions. Take one tablet by mouth every Monday Wednesday and Fridays   insulin glargine 100 UNIT/ML injection Commonly known as: Lantus INJECT 20 UNITS EVERY NIGHT AT BEDTIME (DISCARD OPEN VIAL 28 DAYS AFTER FIRST OPEN) What changed:  how much to take how to take this when to take this additional instructions   latanoprost 0.005 % ophthalmic solution Commonly known as: XALATAN Place 1 drop into both eyes at bedtime.   metoprolol tartrate 50 MG tablet Commonly known as: LOPRESSOR Take 1 tablet (50 mg total) by mouth 2 (two) times daily.   omeprazole 40 MG capsule Commonly known as: PRILOSEC Take 40 mg by mouth daily. 20 min. before breakfast   pioglitazone 30 MG tablet Commonly known as: ACTOS TAKE 1 TABLET EVERY DAY        LABORATORY STUDIES CBC    Component Value Date/Time   WBC 7.9 06/10/2022 0121   RBC 4.15 06/10/2022 0121   HGB 11.5 (L) 06/10/2022  0121   HGB 13.5 07/07/2020 1556   HCT 38.1 06/10/2022 0121   HCT 43.4 07/07/2020 1556   PLT 201 06/10/2022 0121   PLT 199 07/07/2020 1556   MCV 91.8 06/10/2022 0121   MCV 89 07/07/2020 1556   MCH 27.7 06/10/2022 0121   MCHC 30.2 06/10/2022 0121   RDW 15.6 (H) 06/10/2022 0121   RDW 13.8 07/07/2020 1556   LYMPHSABS 1.6 06/08/2022 1630   LYMPHSABS 1.5 07/07/2020 1556   MONOABS 0.6 06/08/2022 1630   EOSABS 0.2 06/08/2022 1630   EOSABS 0.2 07/07/2020 1556   BASOSABS 0.1 06/08/2022 1630   BASOSABS 0.1 07/07/2020 1556   CMP    Component Value Date/Time   NA 135 06/10/2022 0121   NA 137 02/18/2021 1009   K  4.1 06/10/2022 0121   CL 107 06/10/2022 0121   CO2 21 (L) 06/10/2022 0121   GLUCOSE 79 06/10/2022 0121   BUN 25 (H) 06/10/2022 0121   BUN 42 (H) 02/18/2021 1009   CREATININE 1.74 (H) 06/10/2022 0121   CREATININE 1.64 (H) 05/16/2017 1040   CALCIUM 8.3 (L) 06/10/2022 0121   PROT 5.6 (L) 06/10/2022 0121   PROT 6.2 07/13/2021 1444   ALBUMIN 2.9 (L) 06/10/2022 0121   ALBUMIN 4.0 07/13/2021 1444   AST 95 (H) 06/10/2022 0121   ALT 80 (H) 06/10/2022 0121   ALKPHOS 198 (H) 06/10/2022 0121   BILITOT 0.6 06/10/2022 0121   BILITOT 0.4 07/13/2021 1444   GFRNONAA 29 (L) 06/10/2022 0121   GFRAA 31 (L) 07/07/2020 1556   COAGS Lab Results  Component Value Date   INR 1.1 06/08/2022   Lipid Panel    Component Value Date/Time   CHOL 110 06/09/2022 0538   CHOL 120 07/13/2021 1444   TRIG 90 06/09/2022 0538   HDL 40 (L) 06/09/2022 0538   HDL 41 07/13/2021 1444   CHOLHDL 2.8 06/09/2022 0538   VLDL 18 06/09/2022 0538   LDLCALC 52 06/09/2022 0538   LDLCALC 59 07/13/2021 1444   LDLCALC 122 (H) 05/16/2017 1040   HgbA1C  Lab Results  Component Value Date   HGBA1C 6.4 (H) 06/08/2022   Urinalysis    Component Value Date/Time   LABSPEC 1.020 05/18/2018 1019   BILIRUBINUR negative 05/18/2018 1019   BILIRUBINUR neg 12/10/2013 0856   KETONESUR negative 05/18/2018 1019   PROTEINUR negative 05/18/2018 1019   PROTEINUR neg 12/10/2013 0856   UROBILINOGEN negative 12/10/2013 0856   NITRITE Negative 05/18/2018 1019   NITRITE neg 12/10/2013 0856   LEUKOCYTESUR Negative 05/18/2018 1019   Urine Drug Screen     Component Value Date/Time   LABOPIA NONE DETECTED 06/09/2022 1030   COCAINSCRNUR NONE DETECTED 06/09/2022 1030   LABBENZ NONE DETECTED 06/09/2022 1030   AMPHETMU NONE DETECTED 06/09/2022 1030   THCU NONE DETECTED 06/09/2022 1030   LABBARB NONE DETECTED 06/09/2022 1030    Alcohol Level    Component Value Date/Time   ETH <10 06/08/2022 1630     SIGNIFICANT DIAGNOSTIC STUDIES MR  BRAIN WO CONTRAST  Result Date: 06/09/2022 CLINICAL DATA:  Neuro deficit, acute, stroke suspected. Right-sided weakness, confusion, and aphasia. EXAM: MRI HEAD WITHOUT CONTRAST MRA HEAD WITHOUT CONTRAST TECHNIQUE: Multiplanar, multi-echo pulse sequences of the brain and surrounding structures were acquired without intravenous contrast. Angiographic images of the Circle of Willis were acquired using MRA technique without intravenous contrast. COMPARISON:  Head CT 06/09/2022 FINDINGS: MRI HEAD FINDINGS Brain: There is no evidence of an acute infarct, intracranial hemorrhage, mass, midline shift, or  extra-axial fluid collection. A punctate focus of T2 shine through is noted in the anterior right frontal white matter on diffusion imaging. Dural thickening and calcification are noted along the falx. Confluent T2 hyperintensities in the cerebral white matter bilaterally are nonspecific but compatible with severe chronic small vessel ischemic disease. Small chronic infarcts are noted in the right corona radiata and bilateral basal ganglia. There is also a small chronic posterior right parietal cortical infarct. There is mild cerebral atrophy. An enlarged partially empty sella is noted. Vascular: Major intracranial vascular flow voids are preserved. Skull and upper cervical spine: Unremarkable bone marrow signal. Sinuses/Orbits: Bilateral cataract extraction. Mild mucosal thickening in the maxillary sinuses. Bilateral mastoid and middle ear effusions. Other: None. MRA HEAD FINDINGS Anterior circulation: The internal carotid arteries are widely patent from skull base to carotid termini. ACAs and MCAs are patent without evidence of a proximal branch occlusion or significant proximal stenosis. No aneurysm is identified. Posterior circulation: The intracranial vertebral arteries are widely patent to the basilar with the left being slightly dominant. Patent right PICA, left AICA, and bilateral SCA origins are visualized. The  basilar artery is widely patent. There are right larger than left posterior communicating arteries with hypoplasia of the right P1 segment. Both PCAs are patent without evidence of a significant proximal stenosis. No aneurysm is identified. Anatomic variants: Fetal right PCA. IMPRESSION: 1. No acute intracranial abnormality. 2. Severe chronic small vessel ischemic disease. 3. Negative head MRA. Electronically Signed   By: Logan Bores M.D.   On: 06/09/2022 13:39   MR ANGIO HEAD WO CONTRAST  Result Date: 06/09/2022 CLINICAL DATA:  Neuro deficit, acute, stroke suspected. Right-sided weakness, confusion, and aphasia. EXAM: MRI HEAD WITHOUT CONTRAST MRA HEAD WITHOUT CONTRAST TECHNIQUE: Multiplanar, multi-echo pulse sequences of the brain and surrounding structures were acquired without intravenous contrast. Angiographic images of the Circle of Willis were acquired using MRA technique without intravenous contrast. COMPARISON:  Head CT 06/09/2022 FINDINGS: MRI HEAD FINDINGS Brain: There is no evidence of an acute infarct, intracranial hemorrhage, mass, midline shift, or extra-axial fluid collection. A punctate focus of T2 shine through is noted in the anterior right frontal white matter on diffusion imaging. Dural thickening and calcification are noted along the falx. Confluent T2 hyperintensities in the cerebral white matter bilaterally are nonspecific but compatible with severe chronic small vessel ischemic disease. Small chronic infarcts are noted in the right corona radiata and bilateral basal ganglia. There is also a small chronic posterior right parietal cortical infarct. There is mild cerebral atrophy. An enlarged partially empty sella is noted. Vascular: Major intracranial vascular flow voids are preserved. Skull and upper cervical spine: Unremarkable bone marrow signal. Sinuses/Orbits: Bilateral cataract extraction. Mild mucosal thickening in the maxillary sinuses. Bilateral mastoid and middle ear effusions.  Other: None. MRA HEAD FINDINGS Anterior circulation: The internal carotid arteries are widely patent from skull base to carotid termini. ACAs and MCAs are patent without evidence of a proximal branch occlusion or significant proximal stenosis. No aneurysm is identified. Posterior circulation: The intracranial vertebral arteries are widely patent to the basilar with the left being slightly dominant. Patent right PICA, left AICA, and bilateral SCA origins are visualized. The basilar artery is widely patent. There are right larger than left posterior communicating arteries with hypoplasia of the right P1 segment. Both PCAs are patent without evidence of a significant proximal stenosis. No aneurysm is identified. Anatomic variants: Fetal right PCA. IMPRESSION: 1. No acute intracranial abnormality. 2. Severe chronic small vessel ischemic disease.  3. Negative head MRA. Electronically Signed   By: Logan Bores M.D.   On: 06/09/2022 13:39   ECHOCARDIOGRAM COMPLETE  Result Date: 06/09/2022    ECHOCARDIOGRAM REPORT   Patient Name:   Kerri Richardson Date of Exam: 06/09/2022 Medical Rec #:  [id number]        Height:       62.0 in Accession #:    [id number]       Weight:       275.8 lb Date of Birth:  1940/08/16        BSA:          2.191 m Patient Age:    36 years         BP:           136/64 mmHg Patient Gender: F                HR:           68 bpm. Exam Location:  Inpatient Procedure: 2D Echo, Color Doppler and Cardiac Doppler Indications:    Stroke  History:        Patient has prior history of Echocardiogram examinations, most                 recent 04/15/2021. Risk Factors:Diabetes and Hypertension. CKD.  Sonographer:    Eartha Inch Referring Phys: Amie Portland  Sonographer Comments: Technically difficult study due to poor echo windows and patient is obese. Image acquisition challenging due to patient body habitus and Image acquisition challenging due to respiratory motion. IMPRESSIONS  1. Left ventricular  ejection fraction, by estimation, is 65 to 70%. The left ventricle has normal function. The left ventricle has no regional wall motion abnormalities. Left ventricular diastolic parameters are consistent with Grade I diastolic dysfunction (impaired relaxation).  2. Right ventricular systolic function is normal. The right ventricular size is normal.  3. The mitral valve is grossly normal. Trivial mitral valve regurgitation.  4. The aortic valve is tricuspid. There is mild calcification of the aortic valve. There is mild thickening of the aortic valve. Aortic valve regurgitation is not visualized. Aortic valve sclerosis/calcification is present, without any evidence of aortic stenosis.  5. The inferior vena cava is normal in size with greater than 50% respiratory variability, suggesting right atrial pressure of 3 mmHg. Comparison(s): No significant change from prior study. Conclusion(s)/Recommendation(s): No intracardiac source of embolism detected on this transthoracic study. Consider a transesophageal echocardiogram to exclude cardiac source of embolism if clinically indicated. FINDINGS  Left Ventricle: Left ventricular ejection fraction, by estimation, is 65 to 70%. The left ventricle has normal function. The left ventricle has no regional wall motion abnormalities. The left ventricular internal cavity size was normal in size. There is  no left ventricular hypertrophy. Left ventricular diastolic parameters are consistent with Grade I diastolic dysfunction (impaired relaxation). Right Ventricle: The right ventricular size is normal. No increase in right ventricular wall thickness. Right ventricular systolic function is normal. Left Atrium: Left atrial size was normal in size. Right Atrium: Right atrial size was normal in size. Pericardium: There is no evidence of pericardial effusion. Mitral Valve: The mitral valve is grossly normal. Trivial mitral valve regurgitation. MV peak gradient, 6.7 mmHg. The mean mitral  valve gradient is 3.0 mmHg. Tricuspid Valve: The tricuspid valve is normal in structure. Tricuspid valve regurgitation is not demonstrated. Aortic Valve: The aortic valve is tricuspid. There is mild calcification of the aortic valve. There is mild thickening of the aortic valve.  Aortic valve regurgitation is not visualized. Aortic valve sclerosis/calcification is present, without any evidence of aortic stenosis. Aortic valve mean gradient measures 8.0 mmHg. Aortic valve peak gradient measures 14.0 mmHg. Pulmonic Valve: The pulmonic valve was normal in structure. Pulmonic valve regurgitation is trivial. Aorta: The aortic root is normal in size and structure. Venous: The inferior vena cava is normal in size with greater than 50% respiratory variability, suggesting right atrial pressure of 3 mmHg. IAS/Shunts: The atrial septum is grossly normal.  LEFT VENTRICLE PLAX 2D LVIDd:         4.90 cm     Diastology LVIDs:         2.90 cm     LV e' medial:    4.46 cm/s LV PW:         1.10 cm     LV E/e' medial:  21.6 LV IVS:        1.00 cm     LV e' lateral:   6.42 cm/s                            LV E/e' lateral: 15.0  LV Volumes (MOD) LV vol d, MOD A2C: 81.0 ml LV vol d, MOD A4C: 79.4 ml LV vol s, MOD A2C: 28.8 ml LV vol s, MOD A4C: 24.8 ml LV SV MOD A2C:     52.2 ml LV SV MOD A4C:     79.4 ml LV SV MOD BP:      53.4 ml RIGHT VENTRICLE             IVC RV S prime:     11.20 cm/s  IVC diam: 1.10 cm TAPSE (M-mode): 1.6 cm LEFT ATRIUM             Index        RIGHT ATRIUM           Index LA diam:        4.40 cm 2.01 cm/m   RA Area:     15.30 cm LA Vol (A2C):   63.2 ml 28.84 ml/m  RA Volume:   37.30 ml  17.02 ml/m LA Vol (A4C):   68.8 ml 31.39 ml/m LA Biplane Vol: 71.4 ml 32.58 ml/m  AORTIC VALVE AV Vmax:           187.00 cm/s AV Vmean:          138.000 cm/s AV VTI:            0.401 m AV Peak Grad:      14.0 mmHg AV Mean Grad:      8.0 mmHg LVOT Vmax:         151.00 cm/s LVOT Vmean:        101.000 cm/s LVOT VTI:          0.334  m LVOT/AV VTI ratio: 0.83  AORTA Ao Asc diam: 3.00 cm MITRAL VALVE MV Area (PHT): 2.19 cm     SHUNTS MV Peak grad:  6.7 mmHg     Systemic VTI: 0.33 m MV Mean grad:  3.0 mmHg MV Vmax:       1.29 m/s MV Vmean:      75.5 cm/s MV Decel Time: 346 msec MV E velocity: 96.20 cm/s MV A velocity: 123.00 cm/s MV E/A ratio:  0.78 Gwyndolyn Kaufman MD Electronically signed by Gwyndolyn Kaufman MD Signature Date/Time: 06/09/2022/12:20:46 PM    Final    EEG adult  Result Date:  06/09/2022 Lora Havens, MD     06/09/2022  8:31 AM Patient Name: Kerri Richardson MRN: [id number] Epilepsy Attending: Lora Havens Referring Physician/Provider: Greta Doom, MD Date: 06/09/2022 Duration: 22.21 mins Patient history: 81 y.o. female past medical history of CKD 3, diabetes with diabetic retinopathy as complication, hypercholesterolemia, hypertension, presenting to the emergency room for evaluation of sudden onset of word finding difficulty. EEG to evaluate for seizure. Level of alertness: Awake AEDs during EEG study: None Technical aspects: This EEG study was done with scalp electrodes positioned according to the 10-20 International system of electrode placement. Electrical activity was reviewed with band pass filter of 1-_0 , sensitivity of 7 uV/mm, display speed of 5m/sec with a _1  notched filter applied as appropriate. EEG data were recorded continuously and digitally stored.  Video monitoring was available and reviewed as appropriate. Description: The posterior dominant rhythm consists of 8 Hz activity of moderate voltage (25-35 uV) seen predominantly in posterior head regions, symmetric and reactive to eye opening and eye closing. Hyperventilation and photic stimulation were not performed.   IMPRESSION: This study is within normal limits. No seizures or epileptiform discharges were seen throughout the recording. A normal interictal EEG does not exclude the diagnosis of epilepsy. P[place]  CT HEAD WO  CONTRAST (5MM)  Result Date: 06/09/2022 CLINICAL DATA:  81year old female with acute neurologic deficit. EXAM: CT HEAD WITHOUT CONTRAST TECHNIQUE: Contiguous axial images were obtained from the base of the skull through the vertex without intravenous contrast. RADIATION DOSE REDUCTION: This exam was performed according to the departmental dose-optimization program which includes automated exposure control, adjustment of the mA and/or kV according to patient size and/or use of iterative reconstruction technique. COMPARISON:  Head CT 0200 hours today, and earlier. CTA head and neck yesterday. FINDINGS: Brain: Cerebral volume is within normal limits for age. No midline shift, mass effect, or evidence of intracranial mass lesion. No ventriculomegaly. No acute intracranial hemorrhage identified. Stable confluent bilateral cerebral white matter hypodensity, asymmetric involvement of the right corona radiata, since 06/08/2022. Stable gray-white matter differentiation throughout the brain. Vascular: Calcified atherosclerosis at the skull base. Skull: No acute osseous abnormality identified. Sinuses/Orbits: Right greater than left tympanic cavity and mastoid opacification is unchanged. Paranasal sinuses are stable and well aerated. Other: No acute orbit or scalp soft tissue finding. IMPRESSION: 1. Continued stable non contrast CT appearance of the brain since 06/08/2022. Advanced white matter disease most pronounced in the right corona radiata. 2. Right > left tympanic cavity and mastoid opacification. Electronically Signed   By: HGenevie AnnM.D.   On: 06/09/2022 08:06   CT HEAD WO CONTRAST (5MM)  Result Date: 06/09/2022 CLINICAL DATA:  Initial evaluation for mental status change, unknown cause. EXAM: CT HEAD WITHOUT CONTRAST TECHNIQUE: Contiguous axial images were obtained from the base of the skull through the vertex without intravenous contrast. RADIATION DOSE REDUCTION: This exam was performed according to the  departmental dose-optimization program which includes automated exposure control, adjustment of the mA and/or kV according to patient size and/or use of iterative reconstruction technique. COMPARISON:  Prior CT from 06/08/2022 and earlier studies. FINDINGS: Brain: Examination mildly degraded by motion. Atrophy with chronic small vessel ischemic disease. Remote lacunar infarcts about the right greater than left basal ganglia. No acute intracranial hemorrhage. No other acute large vessel territory infarct. No mass lesion, mass effect or midline shift. No hydrocephalus or extra-axial fluid collection. Vascular: Some residual contrast material remains on board. Calcified atherosclerosis present about the skull  base. Skull: Remote parietal burr hole craniotomy is noted. No scalp soft tissue abnormality. Sinuses/Orbits: Globes orbital soft tissues demonstrate no acute finding. Mild mucosal thickening noted about the maxillary sinuses. Paranasal sinuses are otherwise largely clear. Bilateral mastoid and middle ear effusions again noted. Other: None. IMPRESSION: 1. Stable head CT.  No acute intracranial abnormality. 2. Atrophy with chronic small vessel ischemic disease with remote lacunar infarcts about the right greater than left basal ganglia. Electronically Signed   By: Jeannine Boga M.D.   On: 06/09/2022 03:15   CT HEAD WO CONTRAST (5MM)  Result Date: 06/08/2022 CLINICAL DATA:  Initial evaluation for sudden severe headache. EXAM: CT HEAD WITHOUT CONTRAST TECHNIQUE: Contiguous axial images were obtained from the base of the skull through the vertex without intravenous contrast. RADIATION DOSE REDUCTION: This exam was performed according to the departmental dose-optimization program which includes automated exposure control, adjustment of the mA and/or kV according to patient size and/or use of iterative reconstruction technique. COMPARISON:  Prior CTs from earlier the same day. FINDINGS: Brain: Residual  contrast material on board from prior CTA. Atrophy with chronic small vessel ischemic disease. Remote lacunar infarcts about the right greater than left basal ganglia. No acute intracranial hemorrhage. No visible evolving or acute large vessel territory infarct. No mass lesion, midline shift or mass effect. No hydrocephalus or extra-axial fluid collection. Vascular: Contrast material throughout the intracranial vasculature. Scattered vascular calcifications noted within the carotid siphons. Skull: Scalp soft tissues demonstrate no acute finding. Remote biparietal burr holes noted. Sinuses/Orbits: Globes and orbital soft tissues demonstrate no acute finding. Mild mucosal thickening about the maxillary sinuses. Paranasal sinuses are otherwise largely clear. Bilateral mastoid and middle ear effusions again noted. Other: None. IMPRESSION: 1. No acute intracranial abnormality. 2. Atrophy with chronic small vessel ischemic disease, with remote lacunar infarcts about the right greater than left basal ganglia. Electronically Signed   By: Jeannine Boga M.D.   On: 06/08/2022 21:03   CT ANGIO HEAD NECK W WO CM (CODE STROKE)  Result Date: 06/08/2022 CLINICAL DATA:  Neuro deficit, acute, stroke suspected. Right-sided weakness. Right-sided facial droop. EXAM: CT ANGIOGRAPHY HEAD AND NECK TECHNIQUE: Multidetector CT imaging of the head and neck was performed using the standard protocol during bolus administration of intravenous contrast. Multiplanar CT image reconstructions and MIPs were obtained to evaluate the vascular anatomy. Carotid stenosis measurements (when applicable) are obtained utilizing NASCET criteria, using the distal internal carotid diameter as the denominator. RADIATION DOSE REDUCTION: This exam was performed according to the departmental dose-optimization program which includes automated exposure control, adjustment of the mA and/or kV according to patient size and/or use of iterative reconstruction  technique. CONTRAST:  72m OMNIPAQUE IOHEXOL 350 MG/ML SOLN COMPARISON:  Head CT immediately prior FINDINGS: CTA NECK FINDINGS Aortic arch: Aortic atherosclerosis. Branching pattern is normal without origin stenosis. Right carotid system: Common carotid artery widely patent to the bifurcation. Carotid bifurcation is normal without soft or calcified plaque. Cervical ICA is tortuous but widely patent to the skull base. Left carotid system: Common carotid artery widely patent to the bifurcation. Mild calcified plaque at the carotid bifurcation and ICA bulb. Minimal diameter of the ICA bulb is 3.5 mm. Compared to a more distal cervical ICA diameter of 5 mm, this indicates a 30% stenosis. Cervical ICA is tortuous beyond that but widely patent to the skull base. Vertebral arteries: Both vertebral artery origins are normal. Both vertebral arteries are normal through the cervical region to the foramen magnum. Skeleton: Ordinary cervical spondylosis.  Other neck: No mass or lymphadenopathy. Upper chest: Normal Review of the MIP images confirms the above findings CTA HEAD FINDINGS Anterior circulation: Both internal carotid arteries are patent through the skull base and siphon regions. Ordinary siphon atherosclerotic calcification but no stenosis greater than 30%. The anterior and middle cerebral vessels are patent. No large vessel occlusion or proximal stenosis. No aneurysm or vascular malformation. Posterior circulation: Both vertebral arteries patent through the foramen magnum to the basilar artery. No basilar stenosis. Posterior circulation branch vessels are normal. Venous sinuses: Patent and normal. Anatomic variants: None significant. Review of the MIP images confirms the above findings IMPRESSION: 1. No intracranial large vessel occlusion or proximal stenosis. 2. Aortic atherosclerosis. 3. Atherosclerotic change at the left carotid bifurcation and ICA bulb. 30% stenosis of the ICA bulb. Aortic Atherosclerosis  (ICD10-I70.0). Electronically Signed   By: Nelson Chimes M.D.   On: 06/08/2022 15:06   CT HEAD CODE STROKE WO CONTRAST  Result Date: 06/08/2022 CLINICAL DATA:  Code stroke. Neuro deficit, acute, stroke suspected. Right-sided weakness and right facial droop. EXAM: CT HEAD WITHOUT CONTRAST TECHNIQUE: Contiguous axial images were obtained from the base of the skull through the vertex without intravenous contrast. RADIATION DOSE REDUCTION: This exam was performed according to the departmental dose-optimization program which includes automated exposure control, adjustment of the mA and/or kV according to patient size and/or use of iterative reconstruction technique. COMPARISON:  None Available. FINDINGS: Brain: Generalized brain volume loss. Chronic small-vessel ischemic changes affect the cerebral hemispheric white matter. No focal posterior fossa insult. Bilateral parietal/occipital burr holes. More focal area likely chronic white matter ischemic changes noted in the right posterior frontal region. No finding to suggest acute infarction, mass lesion, hemorrhage, hydrocephalus or extra-axial collection presently. Vascular: There is atherosclerotic calcification of the major vessels at the base of the brain. Skull: Negative other than the bilateral parietal/occipital burr holes. Sinuses/Orbits: Sinuses are clear. Orbits are negative. Fluid in the mastoid air cells, right more than left. Some fluid in both middle ears as well. Other: None ASPECTS ([place] Stroke Program Early CT Score) - Ganglionic level infarction (caudate, lentiform nuclei, internal capsule, insula, M1-M3 cortex): 7 - Supraganglionic infarction (M4-M6 cortex): 3 Total score (0-10 with 10 being normal): 10 IMPRESSION: 1. No acute CT finding. Chronic small-vessel ischemic changes of the cerebral hemispheric white matter. Bilateral parietal/occipital burr holes. 2. Aspects is 10. 3. Fluid in the mastoid air cells, right more than left. Some fluid in  both middle ears as well. These results were communicated to Dr. Rory Percy at 232 pm on 06/08/2022 by text page via the [facility] messaging system. Electronically Signed   By: Nelson Chimes M.D.   On: 06/08/2022 14:35      HISTORY OF PRESENT ILLNESS Kerri Richardson is a 81 y.o. female past medical history of CKD 3, diabetes with diabetic retinopathy as complication, hypercholesterolemia, hypertension, presenting to the emergency room for evaluation of sudden onset of word finding difficulty, confusion as well as right-sided weakness.  EMTs report that a family member who came in to check on her called them because she was having a difficult time with her words.  When they arrived, she was flaccid on the right side.  Her right-sided weakness improved on the way but her speech still remained somewhat aphasic.  She was able to tell me her name, month but was not able to name simple objects consistently.  Her repetition remained intact.  The weakness did not recur but speech remained aphasic as  below. No family at bedside.  EMTs noted that the family member who had called her is actually not a blood relative and not medical power of attorney either. Risk benefits of TNK were weighed but patient was unable to make the decision and the decision was made to treat based on severely disabling symptoms, and absence of contraindications.  LKW: 1 PM IV thrombolysis given?:  Yes at 1436 hrs. Premorbid modified Rankin scale (mRS):0   HOSPITAL COURSE  81 year old female with history of diabetes, hypertension, hyperlipidemia, diabetic retinopathy, CKD 3 admitted for right-sided weakness, confusion and speech difficulty. She received IV TNK   Stroke symptoms s/p TNK TIA likely due to uncontrolled risk factors  CT head Stable head CT.  No acute intracranial abnormality. CT head and neck left ICA bulb 30% stenosis.  Post TNK CT repeat for nausea vomiting no acute bleeding.  MRI  no acute infarct 2d Echo EF 65-70%  LDL  52 HgbA1c 6.1  UDS neg EEG: no seizures or epileptiform discharges seen.  aspirin 81 mg daily prior to admission, now on Kerri 81 mg and Plavix 39m for 3 weeks than Plavix alone  PT OT recommend HH OT. Referral has been sent. Patient was deemed safe and appropriate for discharge to home. Patient and family were updated and all questions were answered and voiced understanding  Disposition - home today.  Needs out patient follow up with [facility] in 1 month after discharge   Hypertension Home meds:  amlodipine 173mdaily, enalapril 10 mg daily Stable Long-term BP goal normotensive   Hyperlipidemia Transaminitis  Home meds:  zetia 1071mailyand atorvastatin, not resumed in hospital LDL 52, goal < 70 AST/ALT 83/91 -> 95/80 Resume statin and zetia once liver enzymes return to WNL Will need follow up labs with PCP  Diabetes type II Controlled Home meds:  lantus 20 units every night, Actos 30 mg daily, HgbA1c 6.4, goal < 7.0 CBG SSI Follow up with PCP closely  Other Stroke Risk Factors Advanced Age >/= 65 74besity, Body mass index is 50.44 kg/m., BMI >/= 30 associated with increased stroke risk, recommend weight loss, diet and exercise as appropriate  Family hx stroke (father)    DISCHARGE EXAM Blood pressure 122/62, pulse 61, temperature 98.2 F (36.8 C), temperature source Oral, resp. rate 17, height 5' 2.01" (1.575 m), weight 125 kg, last menstrual period 06/14/1998, SpO2 98 %.  PHYSICAL EXAM General: Awake alert in no distress HEENT: Normocephalic/atraumatic cardiovascular: Regular rhythm Abdomen nondistended nontender Extremities warm well-perfused   Neurological exam She is awake alert oriented to self She was able to tell me her correct age, named thumb, fist appropriately. Repetition was intact Comprehension for simple commands was intact but multistep commands were difficult to follow for her. Cranial nerves: Pupils equal round react light, extraocular movements intact,  visual fields full, face appears to have a very subtle right lower facial weakness, tongue and palate midline. Motor examination with no drift in the upper extremities or lower extremities. Sensation: Intact without extinction Coordination with no dysmetria  Discharge Diet       Diet   Diet Carb Modified Fluid consistency: Thin; Room service appropriate? Yes   liquids  DISCHARGE PLAN Disposition:  home with Home health PT and OT  aspirin 81 mg daily and clopidogrel 75 mg daily for secondary stroke prevention for 3 weeks then Plavix alone. Ongoing stroke risk factor control by Primary Care Physician at time of discharge Follow-up PCP Default, Provider, MD in 2 weeks.Referral  has been sent for establishment.  Liver enzymes should be rechecked in 14 days and consideration of restarting anti lipid medications  Follow-up in [facility] in 8 weeks, office to schedule an appointment.   45 minutes were spent preparing discharge.  Beulah Gandy DNP, ACNPC-AG  [facility]  ATTENDING NOTE: I reviewed above note and agree with the assessment and plan.   Pt no acute event overnight, neuro stable. MRI showed no acute infarct. Now on DAPT for stroke prevention. LDL at goal range, will hold off home statin and zetia due to transaminitis. Recommend to resume once LFT normalizes. PT OT recommend [place] OT. Pt will be discharged in good condition. Follow up with [facility] in 4 weeks.  For detailed assessment and plan, please refer to above/below as I have made changes wherever appropriate.   Rosalin Hawking, MD PhD Stroke Neurology 06/10/2022 5:27 PM

## 2022-06-10 NOTE — Plan of Care (Signed)

## 2022-06-10 NOTE — Progress Notes (Signed)
Occupational Therapy Treatment Patient Details Name: Kerri Richardson MRN: [id number] DOB: 1940-11-12 Today's Date: 06/10/2022   History of present illness 81 yo female presents to [facility] on 12/26 with word finding difficulty, confusion, R weakness. TNK given 1436 on 12/26. CTH negative, MRI and EEG negative. Per neuro, likely TIA. PMH includes  CKD 3, diabetes with diabetic retinopathy, hypercholesterolemia, HTN, glaucoma.   OT comments  Patient engaged in pill box test to assess higher level cognition.  Pt failed the assessment, demonstrating attention, memory, problem solving, poor planning, mental flexibility, suboptimal search strategies, concrete thinking and the inability to multitask. Pt had a total of 45 errors, where more than 3 errors is considered a fail. Pt with decreased awareness to deficits, reports this pill box is different than hers and she would be able to do it at home.  Ultimately, pt agreeable to have assistance with IADLs at dc including medication, meals, finances, and driving.  Discussed how pt can do light meal prep (cold meals, microwave), but not use the stove/oven at this time. Will continue to follow acutely, and updated dc recommendations to home health OT at dc (pt prefers HH over outpatient).       Recommendations for follow up therapy are one component of a multi-disciplinary discharge planning process, led by the attending physician.  Recommendations may be updated based on patient status, additional functional criteria and insurance authorization.    Follow Up Recommendations  Home health OT     Assistance Recommended at Discharge Intermittent Supervision/Assistance  Patient can return home with the following  A little help with walking and/or transfers;A little help with bathing/dressing/bathroom;Assistance with cooking/housework;Assist for transportation;Help with stairs or ramp for entrance;Direct supervision/assist for medications management;Direct  supervision/assist for financial management   Equipment Recommendations  None recommended by OT    Recommendations for Other Services      Precautions / Restrictions Restrictions Weight Bearing Restrictions: No       Mobility Bed Mobility Overal bed mobility: Needs Assistance Bed Mobility: Supine to Sit     Supine to sit: Supervision     General bed mobility comments: increased time but no physical assist required    Transfers                         Balance Overall balance assessment: Needs assistance Sitting-balance support: No upper extremity supported, Feet supported Sitting balance-Leahy Scale: Good                                     ADL either performed or assessed with clinical judgement   ADL                                         General ADL Comments: focused on pill box test    Extremity/Trunk Assessment              Vision       Perception     Praxis      Cognition Arousal/Alertness: Awake/alert Behavior During Therapy: WFL for tasks assessed/performed Overall Cognitive Status: Impaired/Different from baseline Area of Impairment: Attention, Memory, Problem solving, Awareness, Safety/judgement                   Current Attention Level: Selective Memory: Decreased short-term memory Following  Commands: Follows one step commands consistently, Follows one step commands with increased time, Follows multi-step commands inconsistently Safety/Judgement: Decreased awareness of safety, Decreased awareness of deficits Awareness: Emergent Problem Solving: Slow processing, Difficulty sequencing, Requires verbal cues General Comments: pt engaged in pill box test- see below for details   General Comments: Assessed using the Pill Box Test. Pt failed the assessment, demonstrating problem solving, memory, attention, poor planning, mental flexibility, suboptimal search strategies, concrete thinking and  the inability to multitask. Pt had a total of 45 errors, where more than 3 errors is considered a fail.   Errors: One tablet 3x/day (yellow) - 18  errors (omission/misplacement) One tablet 2x/day with breakfast and dinner (green) - 12 errors (omission/misplacement) One tablet in the morning (Blue)- 6 errors (omission/misplacement) One tablet daily at bedtime (orange) - 6 errors(omission/misplacement) One tablet every other day (red) - 2 errors (omission/misplacement)   Number of misplaced movement errors (pills placed in incorrect compartment)- 1 Number of total errors (sum of omissions; misplacements) - 45 Total time to complete task (allowed 5 min) -6 min  Patient provided with pill box test, and asked pt to fill it out for the entire week.  She filled out the first 4 medications for the 1st day (Sunday) but not the rest of the week, the 5th medication (every other day) she placed in the first 3 containers (Sunday, Monday, Tuesday) and then said she was done.  When asked if she remembered the instructions to fill it out for the whole week, she did not.  She also said "this does not have days on it", but was able to clearly see the days of the week when pointed out.  Pt unable to voice "why" she placed the every other day medication in the first 3 days, but then came up with several reasons why she did it that way.  Patient demonstrating poor awareness to deficits and need for assistance, multiple explanations required for her to understand the need for assistance at dc.  In addition, pt requires cueing to problem solve through how to open the pill bottle.      Exercises      Shoulder Instructions       General Comments discussed recommendations for assist with med mgmt, finances, meals and driving at DC.  Pt agreeable, reports [name] can help with meds and her son can drive her to church.    Pertinent Vitals/ Pain       Pain Assessment Pain Assessment: No/denies pain  Home Living                                           Prior Functioning/Environment              Frequency  Min 2X/week        Progress Toward Goals  OT Goals(current goals can now be found in the care plan section)  Progress towards OT goals: Progressing toward goals  Acute Rehab OT Goals Patient Stated Goal: home OT Goal Formulation: With patient Time For Goal Achievement: 06/23/22 Potential to Achieve Goals: Good  Plan Frequency remains appropriate;Discharge plan needs to be updated    Co-evaluation                 AM-PAC OT "6 Clicks" Daily Activity     Outcome Measure   Help from another person eating meals?: A Little Help  from another person taking care of personal grooming?: A Little Help from another person toileting, which includes using toliet, bedpan, or urinal?: A Little Help from another person bathing (including washing, rinsing, drying)?: A Little Help from another person to put on and taking off regular upper body clothing?: A Little Help from another person to put on and taking off regular lower body clothing?: A Little 6 Click Score: 18    End of Session    OT Visit Diagnosis: Other abnormalities of gait and mobility (R26.89);Muscle weakness (generalized) (M62.81);Other symptoms and signs involving cognitive function   Activity Tolerance Patient tolerated treatment well   Patient Left with call bell/phone within reach;in bed;with bed alarm set   Nurse Communication Mobility status        Time: 1610-9604 OT Time Calculation (min): 24 min  Charges: OT General Charges $OT Visit: 1 Visit OT Treatments $Cognitive Funtion inital: Initial 15 mins $Cognitive Funtion additional: Additional15 mins  Barry Brunner, OT Acute Rehabilitation Services Office [phone]   Kerri Richardson 06/10/2022, 12:26 PM

## 2022-06-10 NOTE — Progress Notes (Signed)
Physical Therapy Treatment Patient Details Name: Kerri Richardson MRN: [id number] DOB: 08/27/1940 Today's Date: 06/10/2022   History of Present Illness 81 yo female presents to [facility] on 12/26 with word finding difficulty, confusion, R weakness. TNK given 1436 on 12/26. CTH negative, MRI and EEG negative. Per neuro, likely TIA. PMH includes  CKD 3, diabetes with diabetic retinopathy, hypercholesterolemia, HTN, glaucoma.    PT Comments    Pt eager to d/c home. Pt mobilizing at supervision level at this time, requires min safety and form cues during gait. PT with concerns about safety awareness during mobility, as well as impaired activity tolerance and weakness. Updated recommendations to reflect HHPT. Will continue to follow.      Recommendations for follow up therapy are one component of a multi-disciplinary discharge planning process, led by the attending physician.  Recommendations may be updated based on patient status, additional functional criteria and insurance authorization.  Follow Up Recommendations  Home health PT     Assistance Recommended at Discharge Set up Supervision/Assistance  Patient can return home with the following A little help with walking and/or transfers;A little help with bathing/dressing/bathroom   Equipment Recommendations  None recommended by PT    Recommendations for Other Services       Precautions / Restrictions Precautions Precautions: Fall Restrictions Weight Bearing Restrictions: No     Mobility  Bed Mobility Overal bed mobility: Needs Assistance Bed Mobility: Supine to Sit     Supine to sit: Supervision          Transfers Overall transfer level: Needs assistance Equipment used: Rolling walker (2 wheels) Transfers: Sit to/from Stand Sit to Stand: Supervision           General transfer comment: for safety, multiple bouts of AP rocking to come to standing. Requires cues for safe hand placement when rising/sitting wtih RW     Ambulation/Gait Ambulation/Gait assistance: Supervision Gait Distance (Feet): 50 Feet Assistive device: Rolling walker (2 wheels) Gait Pattern/deviations: Step-through pattern, Decreased stride length, Trunk flexed Gait velocity: decr     General Gait Details: cues for upright posture, placement in RW   Stairs             Wheelchair Mobility    Modified Rankin (Stroke Patients Only) Modified Rankin (Stroke Patients Only) Pre-Morbid Rankin Score: Slight disability Modified Rankin: Moderate disability     Balance Overall balance assessment: Needs assistance Sitting-balance support: No upper extremity supported, Feet supported Sitting balance-Leahy Scale: Fair     Standing balance support: Bilateral upper extremity supported, No upper extremity supported, During functional activity Standing balance-Leahy Scale: Fair Standing balance comment: relies on RW dynamically                            Cognition Arousal/Alertness: Awake/alert Behavior During Therapy: WFL for tasks assessed/performed Overall Cognitive Status: Impaired/Different from baseline Area of Impairment: Attention, Memory, Problem solving, Awareness, Safety/judgement                   Current Attention Level: Selective   Following Commands: Follows one step commands with increased time Safety/Judgement: Decreased awareness of safety, Decreased awareness of deficits Awareness: Emergent Problem Solving: Slow processing, Difficulty sequencing, Requires verbal cues General Comments: pt recalling pill box test from OT session, states "it didn't go well" stating it was because her pill box is different, she couldn't see the days, etc.        Exercises  General Comments General comments (skin integrity, edema, etc.): discussed recommendations for assist with med mgmt, finances, meals and driving at DC.  Pt agreeable, reports Kerri Richardson can help with meds and her son can drive her to  church.      Pertinent Vitals/Pain Pain Assessment Pain Assessment: Faces Faces Pain Scale: Hurts a little bit Pain Location: chronic bil knee pain Pain Descriptors / Indicators: Discomfort Pain Intervention(s): Limited activity within patient's tolerance, Monitored during session, Repositioned    Home Living                          Prior Function            PT Goals (current goals can now be found in the care plan section) Acute Rehab PT Goals Patient Stated Goal: home asap PT Goal Formulation: With patient Time For Goal Achievement: 06/23/22 Potential to Achieve Goals: Good Progress towards PT goals: Progressing toward goals    Frequency    Min 3X/week      PT Plan Current plan remains appropriate    Co-evaluation              AM-PAC PT "6 Clicks" Mobility   Outcome Measure  Help needed turning from your back to your side while in a flat bed without using bedrails?: A Little Help needed moving from lying on your back to sitting on the side of a flat bed without using bedrails?: A Little Help needed moving to and from a bed to a chair (including a wheelchair)?: A Little Help needed standing up from a chair using your arms (e.g., wheelchair or bedside chair)?: A Little Help needed to walk in hospital room?: A Little Help needed climbing 3-5 steps with a railing? : A Little 6 Click Score: 18    End of Session   Activity Tolerance: Patient tolerated treatment well Patient left: with call bell/phone within reach;in bed;with bed alarm set Nurse Communication: Mobility status PT Visit Diagnosis: Other abnormalities of gait and mobility (R26.89)     Time: 5732-2025 PT Time Calculation (min) (ACUTE ONLY): 14 min  Charges:  $Therapeutic Activity: 8-22 mins                     Kerri Richardson, PT DPT Acute Rehabilitation Services Pager [phone]  Office [phone]    Kerri Richardson 06/10/2022, 2:46 PM

## 2022-06-11 ENCOUNTER — Other Ambulatory Visit (HOSPITAL_COMMUNITY): Payer: Self-pay

## 2022-06-11 LAB — GLUCOSE, CAPILLARY
Glucose-Capillary: 103 mg/dL — ABNORMAL HIGH (ref 70–99)
Glucose-Capillary: 125 mg/dL — ABNORMAL HIGH (ref 70–99)
Glucose-Capillary: 137 mg/dL — ABNORMAL HIGH (ref 70–99)

## 2022-06-11 NOTE — Care Management Important Message (Signed)
Important Message  Patient Details  Name: Kerri Richardson MRN: [id number] Date of Birth: 05-Oct-1940   Medicare Important Message Given:  Yes  Patient left prior to IM delivery will mail copy to the patient home address.      Lorie Cleckley 06/11/2022, 12:37 PM

## 2022-06-11 NOTE — Plan of Care (Signed)
Patient discharged today. All belongings returned to patient. Pharmacy medication delivered at bedside. Discharge education provided. Pt in good and stable condition, left 3W at 1230 via wheelchair with assistance to the lobby.  Problem: Education: Goal: Knowledge of disease or condition will improve Outcome: Completed/Met Goal: Knowledge of secondary prevention will improve (MUST DOCUMENT ALL) Outcome: Completed/Met Goal: Knowledge of patient specific risk factors will improve Elta [place] N/A or DELETE if not current risk factor) Outcome: Completed/Met   Problem: Ischemic Stroke/TIA Tissue Perfusion: Goal: Complications of ischemic stroke/TIA will be minimized Outcome: Completed/Met   Problem: Coping: Goal: Will verbalize positive feelings about self Outcome: Completed/Met Goal: Will identify appropriate support needs Outcome: Completed/Met   Problem: Health Behavior/Discharge Planning: Goal: Ability to manage health-related needs will improve Outcome: Completed/Met Goal: Goals will be collaboratively established with patient/family Outcome: Completed/Met   Problem: Self-Care: Goal: Ability to participate in self-care as condition permits will improve Outcome: Completed/Met Goal: Verbalization of feelings and concerns over difficulty with self-care will improve Outcome: Completed/Met Goal: Ability to communicate needs accurately will improve Outcome: Completed/Met   Problem: Nutrition: Goal: Risk of aspiration will decrease Outcome: Completed/Met Goal: Dietary intake will improve Outcome: Completed/Met   Problem: Education: Goal: Ability to describe self-care measures that may prevent or decrease complications (Diabetes Survival Skills Education) will improve Outcome: Completed/Met Goal: Individualized Educational Video(s) Outcome: Completed/Met   Problem: Coping: Goal: Ability to adjust to condition or change in health will improve Outcome: Completed/Met   Problem: Fluid  Volume: Goal: Ability to maintain a balanced intake and output will improve Outcome: Completed/Met   Problem: Health Behavior/Discharge Planning: Goal: Ability to identify and utilize available resources and services will improve Outcome: Completed/Met Goal: Ability to manage health-related needs will improve Outcome: Completed/Met   Problem: Metabolic: Goal: Ability to maintain appropriate glucose levels will improve Outcome: Completed/Met   Problem: Nutritional: Goal: Maintenance of adequate nutrition will improve Outcome: Completed/Met Goal: Progress toward achieving an optimal weight will improve Outcome: Completed/Met   Problem: Skin Integrity: Goal: Risk for impaired skin integrity will decrease Outcome: Completed/Met   Problem: Tissue Perfusion: Goal: Adequacy of tissue perfusion will improve Outcome: Completed/Met   Problem: Education: Goal: Knowledge of General Education information will improve Description: Including pain rating scale, medication(s)/side effects and non-pharmacologic comfort measures Outcome: Completed/Met   Problem: Health Behavior/Discharge Planning: Goal: Ability to manage health-related needs will improve Outcome: Completed/Met   Problem: Clinical Measurements: Goal: Ability to maintain clinical measurements within normal limits will improve Outcome: Completed/Met Goal: Will remain free from infection Outcome: Completed/Met Goal: Diagnostic test results will improve Outcome: Completed/Met Goal: Respiratory complications will improve Outcome: Completed/Met Goal: Cardiovascular complication will be avoided Outcome: Completed/Met   Problem: Activity: Goal: Risk for activity intolerance will decrease Outcome: Completed/Met   Problem: Nutrition: Goal: Adequate nutrition will be maintained Outcome: Completed/Met   Problem: Coping: Goal: Level of anxiety will decrease Outcome: Completed/Met   Problem: Elimination: Goal: Will not  experience complications related to bowel motility Outcome: Completed/Met Goal: Will not experience complications related to urinary retention Outcome: Completed/Met   Problem: Pain Managment: Goal: General experience of comfort will improve Outcome: Completed/Met   Problem: Safety: Goal: Ability to remain free from injury will improve Outcome: Completed/Met   Problem: Skin Integrity: Goal: Risk for impaired skin integrity will decrease Outcome: Completed/Met

## 2022-06-16 ENCOUNTER — Other Ambulatory Visit (HOSPITAL_COMMUNITY): Payer: Self-pay

## 2022-07-08 ENCOUNTER — Encounter: Payer: Self-pay | Admitting: Neurology

## 2022-07-08 ENCOUNTER — Ambulatory Visit: Payer: Medicare HMO | Admitting: Neurology

## 2022-07-08 VITALS — BP 145/73 | HR 62 | Ht 62.0 in | Wt 265.0 lb

## 2022-07-08 DIAGNOSIS — I10 Essential (primary) hypertension: Secondary | ICD-10-CM

## 2022-07-08 DIAGNOSIS — Z794 Long term (current) use of insulin: Secondary | ICD-10-CM

## 2022-07-08 DIAGNOSIS — E119 Type 2 diabetes mellitus without complications: Secondary | ICD-10-CM | POA: Diagnosis not present

## 2022-07-08 DIAGNOSIS — E782 Mixed hyperlipidemia: Secondary | ICD-10-CM

## 2022-07-08 DIAGNOSIS — G459 Transient cerebral ischemic attack, unspecified: Secondary | ICD-10-CM | POA: Diagnosis not present

## 2022-07-08 NOTE — Progress Notes (Signed)
I agree with the above plan 

## 2022-07-08 NOTE — Patient Instructions (Addendum)
Monitor the BP, goal should be < 130/90 Call your family doctor to have your liver enzymes rechecked, they were elevated for unknown reason, if normalized recommended restarting cholesterol therapy Goal LDL <70, A1C < 7.0 Continue the Plavix 75 mg daily Work on strict management of vascular risk factors  See you back as needed

## 2022-07-08 NOTE — Progress Notes (Signed)
Patient: Kerri Richardson Date of Birth: [birth year]  Reason for Visit: Follow up for TIA 06/08/22 post-TNK History from: Patient, friend Lynden Ang  Primary Neurologist: Dr. Pearlean Brownie (Saw Dr. Roda Shutters)  ASSESSMENT AND PLAN 82 y.o. year old female   1.  Stroke symptoms, received TNK, TIA likely due to uncontrolled risk factors -Continue Plavix 75 mg daily -No residual deficits reported  2.  Hypertension -BP goal less than 130/90 -On amlodipine, enalapril, Lasix, metoprolol -Keep a log of BP at home, follow-up with PCP  4.  Hyperlipidemia 5.  Transaminitis -On Zetia and atorvastatin from home, AST/ALT elevated 83/91, 95/80, medication still on hold, needs to contact PCP about lab recheck, recommended consideration to resume  6.  Type 2 diabetes -A1c 6.4, goal less than 7.0 -On Lantus, Actos -Watch diet, sugars, close follow-up with PCP  She will follow-up at our office on an as-needed basis.  Recommended close follow-up with PCP for management of vascular risk factors. We discussed importance of healthy eating, weight loss, diet and exercise.  For any acute stroke symptoms she is to go to the ER.  HISTORY OF PRESENT ILLNESS: Today 07/08/22 [name] here for stroke clinic follow-up.  Presented to the ER 06/08/2022 for sudden onset word finding difficulty, confusion, right-sided weakness.  She received IV TNK. She lives alone. Here with her friend, Lynden Ang who helps her. She is on Plavix 75 mg daily alone now. Has not followed up with her primary care doctor in person, elevated liver enzymes, is still off atorvastatin and Zetia.  BP today was 145/73, is on amlodipine, enalapril, Lasix, metoprolol.  She does not check her BP at home, has a cuff but no batteries.  Since stroke sometimes feels nagging ache to frontal area no associated other neuro symptoms. Speech is back to normal. Is doing home health, once a week, completed OT.  At church 2 weeks ago she got hot, EMS came, vitals were fine she  was not transported.  She has no concerns, is back to baseline.  She walks with a single-point cane.  -CT head no acute abnormality -CTA head and neck left ICA bulb 30% stenosis -Post TNK CT head repeated for nausea and vomiting, no acute changes -MRI of the brain showed no acute infarct -2D echo EF 65 to 70% -LDL 52 -A1c 6.1 -UDS negative -EEG showed no seizures -On aspirin 81 mg daily prior to admission, then aspirin 81 mg and Plavix 75 mg daily for 3 weeks then Plavix alone  HISTORY 06/08/22 Dr. Wilford Corner HPI: Kerri Richardson is a 82 y.o. female past medical history of CKD 3, diabetes with diabetic retinopathy as complication, hypercholesterolemia, hypertension, presenting to the emergency room for evaluation of sudden onset of word finding difficulty, confusion as well as right-sided weakness.  EMTs report that a family member who came in to check on her called them because she was having a difficult time with her words.  When they arrived, she was flaccid on the right side.  Her right-sided weakness improved on the way but her speech still remained somewhat aphasic.  She was able to tell me her name, month but was not able to name simple objects consistently.  Her repetition remained intact.  The weakness did not recur but speech remained aphasic as below. No family at bedside.  EMTs noted that the family member who had called her is actually not a blood relative and not medical power of attorney either. Risk benefits of TNK were weighed but  patient was unable to make the decision and the decision was made to treat based on severely disabling symptoms, and absence of contraindications.     LKW: 1 PM IV thrombolysis given?:  Yes at 1436 hrs. Premorbid modified Rankin scale (mRS):0   REVIEW OF SYSTEMS: Out of a complete 14 system review of symptoms, the patient complains only of the following symptoms, and all other reviewed systems are negative.  See HPI  ALLERGIES: No Known  Allergies  HOME MEDICATIONS: Outpatient Medications Prior to Visit  Medication Sig Dispense Refill   ACCU-CHEK AVIVA PLUS test strip TEST BLOOD SUGAR TWICE DAILY 200 strip 3   Accu-Chek Softclix Lancets lancets TEST BLOOD SUGAR TWICE DAILY 200 each 10   acetaminophen (TYLENOL) 325 MG tablet Take 650 mg by mouth every 6 (six) hours as needed for mild pain or moderate pain.     allopurinol (ZYLOPRIM) 100 MG tablet Take 1 tablet (100 mg total) by mouth 2 (two) times daily. 60 tablet 0   amLODipine (NORVASC) 10 MG tablet Take 1 tablet (10 mg total) by mouth daily. 90 tablet 1   Blood Glucose Monitoring Suppl (ACCU-CHEK NANO SMARTVIEW) w/Device KIT 1 each by Does not apply route 2 (two) times daily. BID 1 kit 0   brimonidine (ALPHAGAN) 0.2 % ophthalmic solution Place 1 drop into both eyes 2 (two) times daily. 10 mL 12   clopidogrel (PLAVIX) 75 MG tablet Take 1 tablet (75 mg total) by mouth daily. 30 tablet 0   DROPLET INSULIN SYRINGE 31G X 5/16" 0.5 ML MISC USE EVERY DAY 100 each 3   enalapril (VASOTEC) 10 MG tablet TAKE 1 TABLET EVERY DAY (Patient taking differently: Take 10 mg by mouth daily.) 90 tablet 0   furosemide (LASIX) 20 MG tablet Take 10 mg by mouth See admin instructions. Take one tablet by mouth every Monday Wednesday and Fridays     insulin glargine (LANTUS) 100 UNIT/ML injection INJECT 20 UNITS EVERY NIGHT AT BEDTIME (DISCARD OPEN VIAL 28 DAYS AFTER FIRST OPEN) (Patient taking differently: Inject 20 Units into the skin at bedtime.) 30 mL 2   latanoprost (XALATAN) 0.005 % ophthalmic solution Place 1 drop into both eyes at bedtime. 2.5 mL 12   metoprolol tartrate (LOPRESSOR) 50 MG tablet Take 1 tablet (50 mg total) by mouth 2 (two) times daily. 60 tablet 0   omeprazole (PRILOSEC) 40 MG capsule Take 40 mg by mouth daily. 20 min. before breakfast     pioglitazone (ACTOS) 30 MG tablet TAKE 1 TABLET EVERY DAY (Patient taking differently: Take 30 mg by mouth daily.) 90 tablet 0   aspirin EC  81 MG tablet Take 1 tablet (81 mg total) by mouth daily. Swallow whole. (Patient not taking: Reported on 07/08/2022) 30 tablet 12   No facility-administered medications prior to visit.    PAST MEDICAL HISTORY: Past Medical History:  Diagnosis Date   Arthritis    CKD (chronic kidney disease) stage 3, GFR 30-59 ml/min (HCC)    Dr. [name]   Diabetic retinopathy    DM retinopathy ([place]) 08/17/12   early   Dyspnea    Essential hypertension, benign    Glaucoma    Gout    Pure hypercholesterolemia    Type II or unspecified type diabetes mellitus without mention of complication, not stated as uncontrolled     PAST SURGICAL HISTORY: Past Surgical History:  Procedure Laterality Date   [place] holes in skull to alleviate pressure on the  brain (no shunt)   CATARACT EXTRACTION, BILATERAL  2012   Dr. Dione Booze   COLONOSCOPY  3/05, 11/2013   due again 2020   COLPOSCOPY N/A 05/27/2015   Procedure: COLPOSCOPY;  Surgeon: Patton Salles, MD;  Location: [facility];  Service: Gynecology;  Laterality: N/A;   ESOPHAGOGASTRODUODENOSCOPY (EGD) WITH PROPOFOL N/A 05/15/2021   Procedure: ESOPHAGOGASTRODUODENOSCOPY (EGD) WITH PROPOFOL;  Surgeon: Jeani Hawking, MD;  Location: [facility];  Service: Endoscopy;  Laterality: N/A;   LEEP N/A 05/27/2015   Procedure: LOOP ELECTROSURGICAL EXCISION PROCEDURE (LEEP);  Surgeon: Patton Salles, MD;  Location: [facility];  Service: Gynecology;  Laterality: N/A;   SAVORY DILATION N/A 05/15/2021   Procedure: SAVORY DILATION;  Surgeon: Jeani Hawking, MD;  Location: [facility];  Service: Endoscopy;  Laterality: N/A;   TONSILLECTOMY  age 8    FAMILY HISTORY: Family History  Problem Relation Age of Onset   Hypertension Mother    Cancer Mother        lung (nonsmoker)   Stroke Father    Hypertension Father    Diabetes Father    Hypertension Sister    Heart disease Sister 29       stent   Diabetes Sister    Heart disease Sister     Hypertension Sister    Breast cancer Maternal Aunt 68       dec of old age   Diabetes Paternal Grandmother     SOCIAL HISTORY: Social History   Socioeconomic History   Marital status: Single    Spouse name: Not on file   Number of children: Not on file   Years of education: Not on file   Highest education level: Not on file  Occupational History   Occupation: Retired  Tobacco Use   Smoking status: Former    Types: Cigarettes    Quit date: 06/14/2000    Years since quitting: 22.0   Smokeless tobacco: Never  Vaping Use   Vaping Use: Never used  Substance and Sexual Activity   Alcohol use: No    Alcohol/week: 0.0 standard drinks of alcohol   Drug use: No   Sexual activity: Never    Partners: Male    Birth control/protection: Post-menopausal  Other Topics Concern   Not on file  Social History Narrative   Worked at [employer], retired 12/2013. Lives alone.  She has a sister in [place] (no longer speaking to each other)  No children. Her God-son lives in [place].   Social Determinants of Health   Financial Resource Strain: Not on file  Food Insecurity: No Food Insecurity (06/09/2022)   Hunger Vital Sign    Worried About Running Out of Food in the Last Year: Never true    Ran Out of Food in the Last Year: Never true  Transportation Needs: No Transportation Needs (06/09/2022)   PRAPARE - Administrator, Civil Service (Medical): No    Lack of Transportation (Non-Medical): No  Physical Activity: Not on file  Stress: Not on file  Social Connections: Not on file  Intimate Partner Violence: Not At Risk (06/09/2022)   Humiliation, Afraid, Rape, and Kick questionnaire    Fear of Current or Ex-Partner: No    Emotionally Abused: No    Physically Abused: No    Sexually Abused: No    PHYSICAL EXAM  Vitals:   07/08/22 1004 07/08/22 1007  BP: (!) 169/75 (!) 145/73  Pulse: 70 62  Weight: 265 lb (120.2 kg)  Height: 5\' 2"  (1.575 m)    Body mass index is 48.47  kg/m.  Generalized: Well developed, in no acute distress  Neurological examination  Mentation: Alert oriented to time, place, history taking. Follows all commands speech and language fluent Cranial nerve II-XII: Pupils were equal round reactive to light. Extraocular movements were full, visual field were full on confrontational test. Facial sensation and strength were normal. Head turning and shoulder shrug  were normal and symmetric. Motor: The motor testing reveals 5 over 5 strength of all 4 extremities. Good symmetric motor tone is noted throughout.  Sensory: Sensory testing is intact to soft touch on all 4 extremities. No evidence of extinction is noted.  Coordination: Cerebellar testing reveals good finger-nose-finger and heel-to-shin bilaterally.  Gait and station: The patient from seated position 3 times to stand, gait is wide-based, cautious, uses single-point cane Reflexes: Deep tendon reflexes are symmetric but decreased  DIAGNOSTIC DATA (LABS, IMAGING, TESTING) - I reviewed patient records, labs, notes, testing and imaging myself where available.  Lab Results  Component Value Date   WBC 7.9 06/10/2022   HGB 11.5 (L) 06/10/2022   HCT 38.1 06/10/2022   MCV 91.8 06/10/2022   PLT 201 06/10/2022      Component Value Date/Time   NA 135 06/10/2022 0121   NA 137 02/18/2021 1009   K 4.1 06/10/2022 0121   CL 107 06/10/2022 0121   CO2 21 (L) 06/10/2022 0121   GLUCOSE 79 06/10/2022 0121   BUN 25 (H) 06/10/2022 0121   BUN 42 (H) 02/18/2021 1009   CREATININE 1.74 (H) 06/10/2022 0121   CREATININE 1.64 (H) 05/16/2017 1040   CALCIUM 8.3 (L) 06/10/2022 0121   PROT 5.6 (L) 06/10/2022 0121   PROT 6.2 07/13/2021 1444   ALBUMIN 2.9 (L) 06/10/2022 0121   ALBUMIN 4.0 07/13/2021 1444   AST 95 (H) 06/10/2022 0121   ALT 80 (H) 06/10/2022 0121   ALKPHOS 198 (H) 06/10/2022 0121   BILITOT 0.6 06/10/2022 0121   BILITOT 0.4 07/13/2021 1444   GFRNONAA 29 (L) 06/10/2022 0121   GFRAA 31 (L)  07/07/2020 1556   Lab Results  Component Value Date   CHOL 110 06/09/2022   HDL 40 (L) 06/09/2022   LDLCALC 52 06/09/2022   TRIG 90 06/09/2022   CHOLHDL 2.8 06/09/2022   Lab Results  Component Value Date   HGBA1C 6.4 (H) 06/08/2022   No results found for: "VITAMINB12" Lab Results  Component Value Date   TSH 3.070 07/13/2021    Butler Denmark, AGNP-C, DNP 07/08/2022, 10:21 AM [facility] [address], [place], [place] [address] [phone]

## 2022-07-14 ENCOUNTER — Other Ambulatory Visit: Payer: Self-pay | Admitting: Family Medicine

## 2022-07-14 DIAGNOSIS — E1122 Type 2 diabetes mellitus with diabetic chronic kidney disease: Secondary | ICD-10-CM

## 2022-12-06 ENCOUNTER — Other Ambulatory Visit: Payer: Self-pay | Admitting: Family Medicine

## 2023-03-17 ENCOUNTER — Ambulatory Visit: Payer: Medicare HMO | Attending: Cardiology | Admitting: Cardiology

## 2023-03-18 ENCOUNTER — Encounter: Payer: Self-pay | Admitting: Cardiology

## 2023-04-24 NOTE — Progress Notes (Deleted)
  Cardiology Office Note:  .   Date:  04/24/2023  ID:  Kerri Richardson, DOB 1940/08/10, MRN [id number] PCP: Default, Provider, MD  [facility] Providers Cardiologist:  Donato Schultz, MD   History of Present Illness: .   Kerri Richardson is a 82 y.o. female with a past medical history of RBBB, HTN, type 2 DM, CKD, history of TIA. Patient is followed by Dr. Anne Fu and presents today for an annual follow up appointment   Per chart review, patient was first referred to Dr. Anne Fu in 07/2016 for evaluation of irregular heart beats. EKG was reassuring without evidence of PACs, atrial fib or flutter. No further workup was pursued at that time. Was again seen by Dr. Anne Fu in 03/2021 for evaluation of shortness of breath. Suspected that the shortness of breath was multifactorial and due to deconditioning. Underwent echocardiogram on 04/15/21 that showed EF 72%, no regional wall motion abnormalities, grade II DD, normal RV function, mild mitral regurgitation.   Patient was later admitted with TIA in 05/2022. Underwent echocardiogram on 06/09/22 that showed EF 65-70%, no regional wall motion abnormalities, grade I DD, normal RV function.   HLD  - Lipid panel from 05/2022 showed LDL 52, HDL 40, triglycerides 90, total cholesterol 110  - Note, atorvastatin and zetia were held in 05/2022 due to elevated liver enzymes (AST/ALT elevated 83/91) - Ordered LFTs, lipid panel today   History of TIA  - Had TIA in 05/2022. Echocardiogram at that time showed EF 65-70%, no regional wall motion abnormalities, grade I DD, normal RV function  - As above, consider resuming statin therapy pending LFTs  - Continue plavix***  HTN   Type 2 DM  - Followed by PCP   ROS: ***  Studies Reviewed: .        *** Risk Assessment/Calculations:   {Does this patient have ATRIAL FIBRILLATION?:[id number]} No BP recorded.  {Refresh Note OR Click here to enter BP  :1}***       Physical Exam:   VS:  LMP 06/14/1998  (Approximate)    Wt Readings from Last 3 Encounters:  07/08/22 265 lb (120.2 kg)  06/09/22 275 lb 9.2 oz (125 kg)  07/13/21 269 lb 9.6 oz (122.3 kg)    GEN: Well nourished, well developed in no acute distress NECK: No JVD; No carotid bruits CARDIAC: ***RRR, no murmurs, rubs, gallops RESPIRATORY:  Clear to auscultation without rales, wheezing or rhonchi  ABDOMEN: Soft, non-tender, non-distended EXTREMITIES:  No edema; No deformity   ASSESSMENT AND PLAN: .   ***    {Are you ordering a CV Procedure (e.g. stress test, cath, DCCV, TEE, etc)?   Press F2        :[id number]}  Dispo: ***  Signed, Jonita Albee, PA-C

## 2023-04-26 ENCOUNTER — Ambulatory Visit: Payer: Medicare HMO | Admitting: Cardiology

## 2023-07-10 NOTE — Progress Notes (Signed)
Cardiology Office Note:  .   Date:  07/15/2023  ID:  Kerri Richardson, DOB 11/06/1940, MRN [id number] PCP: Default, Provider, MD  [facility] Providers Cardiologist:  Donato Schultz, MD   History of Present Illness: .   Kerri Richardson is a 83 y.o. female with a past medical history of TIA, RBBB, HTN, type 2 DM, CKD, HLD. Patient is followed by Dr. Anne Fu and presents today for follow up on HLD.   Patient first established care with Dr. Anne Fu in 07/2016 for evaluation of irregular heart beats. Rhythm strips were reviewed and showed sinus tachycardia. EKG was reassuring. Patient was reassured, and no further workup was pursued at that time. Again seen by Dr. Anne Fu in 03/2021 for evaluation of shortness of breath. She underwent echocardiogram on 04/15/21 that showed EF 72%, no regional wall motion abnormalities, grade II DD, normal RV function, mild MR.   In 05/2022, patient was admitted with TIA. Underwent echo from 05/2022 showed EF 65-70%, no regional wall motion abnormalities, grade I DD, normal RV function. Lipid panel from 05/2022 showed LDL 52, HDL 40, triglycerides 90, total cholesterol 110.   The patient, with a history of transient ischemic attack (TIA), presents for a routine cardiology follow-up. The patient's primary concern is stable shortness of breath, which occurs on exertion. The patient denies any chest pain, palpitations, or syncope. The patient also reports a history of generalized headaches, which have not occurred in the past two to three weeks. The headaches were relieved by Tylenol and were not associated with any vision changes or syncope. The patient is on Lasix, Plavix, insulin, and cholesterol medication. She has regular follow-ups with her primary care provider who manages her cholesterol and diabetes.  ROS: per HPI   Studies Reviewed: .   Cardiac Studies & Procedures      ECHOCARDIOGRAM  ECHOCARDIOGRAM COMPLETE 06/09/2022  Narrative ECHOCARDIOGRAM  REPORT    Patient Name:   Kerri Richardson Date of Exam: 06/09/2022 Medical Rec #:  [id number]        Height:       62.0 in Accession #:    [id number]       Weight:       275.8 lb Date of Birth:  February 05, 1941        BSA:          2.191 m Patient Age:    81 years         BP:           136/64 mmHg Patient Gender: F                HR:           68 bpm. Exam Location:  Inpatient  Procedure: 2D Echo, Color Doppler and Cardiac Doppler  Indications:    Stroke  History:        Patient has prior history of Echocardiogram examinations, most recent 04/15/2021. Risk Factors:Diabetes and Hypertension. CKD.  Sonographer:    Milda Smart Referring Phys: Milon Dikes   Sonographer Comments: Technically difficult study due to poor echo windows and patient is obese. Image acquisition challenging due to patient body habitus and Image acquisition challenging due to respiratory motion. IMPRESSIONS   1. Left ventricular ejection fraction, by estimation, is 65 to 70%. The left ventricle has normal function. The left ventricle has no regional wall motion abnormalities. Left ventricular diastolic parameters are consistent with Grade I diastolic dysfunction (impaired relaxation). 2. Right ventricular systolic function is normal.  The right ventricular size is normal. 3. The mitral valve is grossly normal. Trivial mitral valve regurgitation. 4. The aortic valve is tricuspid. There is mild calcification of the aortic valve. There is mild thickening of the aortic valve. Aortic valve regurgitation is not visualized. Aortic valve sclerosis/calcification is present, without any evidence of aortic stenosis. 5. The inferior vena cava is normal in size with greater than 50% respiratory variability, suggesting right atrial pressure of 3 mmHg.  Comparison(s): No significant change from prior study.  Conclusion(s)/Recommendation(s): No intracardiac source of embolism detected on this transthoracic study. Consider a  transesophageal echocardiogram to exclude cardiac source of embolism if clinically indicated.  FINDINGS Left Ventricle: Left ventricular ejection fraction, by estimation, is 65 to 70%. The left ventricle has normal function. The left ventricle has no regional wall motion abnormalities. The left ventricular internal cavity size was normal in size. There is no left ventricular hypertrophy. Left ventricular diastolic parameters are consistent with Grade I diastolic dysfunction (impaired relaxation).  Right Ventricle: The right ventricular size is normal. No increase in right ventricular wall thickness. Right ventricular systolic function is normal.  Left Atrium: Left atrial size was normal in size.  Right Atrium: Right atrial size was normal in size.  Pericardium: There is no evidence of pericardial effusion.  Mitral Valve: The mitral valve is grossly normal. Trivial mitral valve regurgitation. MV peak gradient, 6.7 mmHg. The mean mitral valve gradient is 3.0 mmHg.  Tricuspid Valve: The tricuspid valve is normal in structure. Tricuspid valve regurgitation is not demonstrated.  Aortic Valve: The aortic valve is tricuspid. There is mild calcification of the aortic valve. There is mild thickening of the aortic valve. Aortic valve regurgitation is not visualized. Aortic valve sclerosis/calcification is present, without any evidence of aortic stenosis. Aortic valve mean gradient measures 8.0 mmHg. Aortic valve peak gradient measures 14.0 mmHg.  Pulmonic Valve: The pulmonic valve was normal in structure. Pulmonic valve regurgitation is trivial.  Aorta: The aortic root is normal in size and structure.  Venous: The inferior vena cava is normal in size with greater than 50% respiratory variability, suggesting right atrial pressure of 3 mmHg.  IAS/Shunts: The atrial septum is grossly normal.   LEFT VENTRICLE PLAX 2D LVIDd:         4.90 cm     Diastology LVIDs:         2.90 cm     LV e' medial:     4.46 cm/s LV PW:         1.10 cm     LV E/e' medial:  21.6 LV IVS:        1.00 cm     LV e' lateral:   6.42 cm/s LV E/e' lateral: 15.0  LV Volumes (MOD) LV vol d, MOD A2C: 81.0 ml LV vol d, MOD A4C: 79.4 ml LV vol s, MOD A2C: 28.8 ml LV vol s, MOD A4C: 24.8 ml LV SV MOD A2C:     52.2 ml LV SV MOD A4C:     79.4 ml LV SV MOD BP:      53.4 ml  RIGHT VENTRICLE             IVC RV S prime:     11.20 cm/s  IVC diam: 1.10 cm TAPSE (M-mode): 1.6 cm  LEFT ATRIUM             Index        RIGHT ATRIUM  Index LA diam:        4.40 cm 2.01 cm/m   RA Area:     15.30 cm LA Vol (A2C):   63.2 ml 28.84 ml/m  RA Volume:   37.30 ml  17.02 ml/m LA Vol (A4C):   68.8 ml 31.39 ml/m LA Biplane Vol: 71.4 ml 32.58 ml/m AORTIC VALVE AV Vmax:           187.00 cm/s AV Vmean:          138.000 cm/s AV VTI:            0.401 m AV Peak Grad:      14.0 mmHg AV Mean Grad:      8.0 mmHg LVOT Vmax:         151.00 cm/s LVOT Vmean:        101.000 cm/s LVOT VTI:          0.334 m LVOT/AV VTI ratio: 0.83  AORTA Ao Asc diam: 3.00 cm  MITRAL VALVE MV Area (PHT): 2.19 cm     SHUNTS MV Peak grad:  6.7 mmHg     Systemic VTI: 0.33 m MV Mean grad:  3.0 mmHg MV Vmax:       1.29 m/s MV Vmean:      75.5 cm/s MV Decel Time: 346 msec MV E velocity: 96.20 cm/s MV A velocity: 123.00 cm/s MV E/A ratio:  0.78  Laurance Flatten MD Electronically signed by Laurance Flatten MD Signature Date/Time: 06/09/2022/12:20:46 PM    Final             Risk Assessment/Calculations:             Physical Exam:   VS:  BP 116/78 (BP Location: Left Arm, Patient Position: Sitting, Cuff Size: Normal)   Pulse 63   Ht 5\' 2"  (1.575 m)   Wt 218 lb 6.4 oz (99.1 kg)   LMP 06/14/1998 (Approximate)   SpO2 98%   BMI 39.95 kg/m    Wt Readings from Last 3 Encounters:  07/15/23 218 lb 6.4 oz (99.1 kg)  07/08/22 265 lb (120.2 kg)  06/09/22 275 lb 9.2 oz (125 kg)    GEN: Well nourished, well developed in no acute  distress. Sitting upright in the chair  NECK: No JVD CARDIAC: RRR, no murmurs, rubs, gallops. Radial pulses 2+ bilaterally  RESPIRATORY:  Clear to auscultation without rales, wheezing or rhonchi. Normal WOB on room air  ABDOMEN: Soft, non-tender, non-distended. EXTREMITIES:  No edema in BLE; No deformity   ASSESSMENT AND PLAN: .    HTN  - Currently on amlodipine 10 mg daily, enalapril 10 mg daily, metoprolol tartrate 50 mg BID  - BP well controlled. Denies symptoms of orthostatic hypotension  - Labwork followed by PCP- last checked in 04/2023  - Continue current regiment   Shortness of Breath  - Most recent echocardiogram from 05/2022 showed EF 65-70%, no regional wall motion abnormalities, Grade I DD, normal RV function - Patient reports that her symptoms have been stable. She gets a bit short of breath on exertion, but denies shortness of breath at rest or orthopnea. No ankle edema - Euvolemic on exam  - Continue lasix 10 mg MWF   Bifascicular Block  PACs  - EKG today showed RBBB, LAFB, and PACs  - Patient denies syncope, near syncope, weakness. No palpitations  - OK to continue metoprolol tartrate 50 mg BID as she denies symptoms of heart block. HR in the 60s today   HLD  History of TIA - Had TIA in 05/2022. Echo at that time showed EF 65-70%, no regional wall motion abnormalities, Grade I DD, normal RV function - Continue plavix 75 mg daily - Patient is on lipitor, zetia. Unsure what doses. Labs and cholesterol medications are followed by her PCP    Dispo: Follow up in 1 year with Dr. Anne Fu   Signed, Jonita Albee, PA-C

## 2023-07-15 ENCOUNTER — Encounter: Payer: Self-pay | Admitting: Cardiology

## 2023-07-15 ENCOUNTER — Ambulatory Visit: Payer: Medicare HMO | Attending: Cardiology | Admitting: Cardiology

## 2023-07-15 VITALS — BP 116/78 | HR 63 | Ht 62.0 in | Wt 218.4 lb

## 2023-07-15 DIAGNOSIS — E782 Mixed hyperlipidemia: Secondary | ICD-10-CM

## 2023-07-15 DIAGNOSIS — I10 Essential (primary) hypertension: Secondary | ICD-10-CM | POA: Diagnosis not present

## 2023-07-15 DIAGNOSIS — R0602 Shortness of breath: Secondary | ICD-10-CM | POA: Diagnosis not present

## 2023-07-15 DIAGNOSIS — I4510 Unspecified right bundle-branch block: Secondary | ICD-10-CM

## 2023-07-15 DIAGNOSIS — G459 Transient cerebral ischemic attack, unspecified: Secondary | ICD-10-CM

## 2023-07-15 NOTE — Patient Instructions (Signed)
Medication Instructions:  No changes *If you need a refill on your cardiac medications before your next appointment, please call your pharmacy*  Lab Work: No labs  Testing/Procedures: No testing  Follow-Up: At [facility], you and your health needs are our priority.  As part of our continuing mission to provide you with exceptional heart care, we have created designated Provider Care Teams.  These Care Teams include your primary Cardiologist (physician) and Advanced Practice Providers (APPs -  Physician Assistants and Nurse Practitioners) who all work together to provide you with the care you need, when you need it.  We recommend signing up for the patient portal called "MyChart".  Sign up information is provided on this After Visit Summary.  MyChart is used to connect with patients for Virtual Visits (Telemedicine).  Patients are able to view lab/test results, encounter notes, upcoming appointments, etc.  Non-urgent messages can be sent to your provider as well.   To learn more about what you can do with MyChart, go to [email or web address].    Your next appointment:   1 year(s)  Provider:   Donato Schultz, MD   Other Instructions
